# Patient Record
Sex: Male | Born: 1935 | Race: White | Hispanic: No | Marital: Married | Smoking: Former smoker
Health system: Southern US, Community
[De-identification: ages and names within clinical notes are randomized; demographics above are authoritative.]

## PROBLEM LIST (undated history)

## (undated) DIAGNOSIS — I714 Abdominal aortic aneurysm, without rupture: Secondary | ICD-10-CM

## (undated) DIAGNOSIS — J9383 Other pneumothorax: Secondary | ICD-10-CM

## (undated) DIAGNOSIS — K219 Gastro-esophageal reflux disease without esophagitis: Secondary | ICD-10-CM

## (undated) DIAGNOSIS — J449 Chronic obstructive pulmonary disease, unspecified: Secondary | ICD-10-CM

## (undated) DIAGNOSIS — IMO0001 Reserved for inherently not codable concepts without codable children: Secondary | ICD-10-CM

## (undated) HISTORY — PX: HERNIA REPAIR: SHX51

## (undated) HISTORY — DX: Abdominal aortic aneurysm, without rupture: I71.4

## (undated) HISTORY — PX: CHOLECYSTECTOMY: SHX55

---

## 2008-12-30 ENCOUNTER — Ambulatory Visit: Payer: Self-pay | Admitting: Family Medicine

## 2008-12-30 DIAGNOSIS — E785 Hyperlipidemia, unspecified: Secondary | ICD-10-CM

## 2008-12-30 DIAGNOSIS — K219 Gastro-esophageal reflux disease without esophagitis: Secondary | ICD-10-CM

## 2008-12-30 DIAGNOSIS — J449 Chronic obstructive pulmonary disease, unspecified: Secondary | ICD-10-CM

## 2009-01-01 ENCOUNTER — Ambulatory Visit: Payer: Self-pay | Admitting: Family Medicine

## 2009-01-01 DIAGNOSIS — IMO0002 Reserved for concepts with insufficient information to code with codable children: Secondary | ICD-10-CM

## 2009-01-01 DIAGNOSIS — L02519 Cutaneous abscess of unspecified hand: Secondary | ICD-10-CM

## 2009-01-01 DIAGNOSIS — L03119 Cellulitis of unspecified part of limb: Secondary | ICD-10-CM

## 2009-01-02 ENCOUNTER — Ambulatory Visit: Payer: Self-pay | Admitting: Family Medicine

## 2012-05-29 DIAGNOSIS — H11002 Unspecified pterygium of left eye: Secondary | ICD-10-CM | POA: Insufficient documentation

## 2013-11-03 ENCOUNTER — Encounter: Payer: Self-pay | Admitting: Emergency Medicine

## 2013-11-03 ENCOUNTER — Emergency Department (INDEPENDENT_AMBULATORY_CARE_PROVIDER_SITE_OTHER)
Admission: EM | Admit: 2013-11-03 | Discharge: 2013-11-03 | Disposition: A | Discharge status: Home / Self Care | Payer: Medicare Other | Source: Home / Self Care | Attending: Emergency Medicine | Admitting: Emergency Medicine

## 2013-11-03 DIAGNOSIS — S51811A Laceration without foreign body of right forearm, initial encounter: Secondary | ICD-10-CM

## 2013-11-03 DIAGNOSIS — Z23 Encounter for immunization: Secondary | ICD-10-CM

## 2013-11-03 DIAGNOSIS — S51809A Unspecified open wound of unspecified forearm, initial encounter: Secondary | ICD-10-CM

## 2013-11-03 HISTORY — DX: Reserved for inherently not codable concepts without codable children: IMO0001

## 2013-11-03 HISTORY — DX: Chronic obstructive pulmonary disease, unspecified: J44.9

## 2013-11-03 HISTORY — DX: Other pneumothorax: J93.83

## 2013-11-03 HISTORY — DX: Gastro-esophageal reflux disease without esophagitis: K21.9

## 2013-11-03 MED ORDER — CEPHALEXIN 500 MG PO CAPS: 500.0000 mg | ORAL_CAPSULE | Freq: Three times a day (TID) | ORAL | Status: DC

## 2013-11-03 MED ORDER — TETANUS-DIPHTH-ACELL PERTUSSIS 5-2.5-18.5 LF-MCG/0.5 IM SUSP
0.5000 mL | Freq: Once | INTRAMUSCULAR | Status: AC
  Administered 2013-11-03: 0.5 mL via INTRAMUSCULAR

## 2013-11-03 NOTE — ED Provider Notes (Addendum)
CSN: [id number]     Arrival date & time 11/03/13  1255 History   First MD Initiated Contact with Patient 11/03/13 1304     Chief Complaint  Patient presents with  . Extremity Laceration   Patient is a 80 y.o. male presenting with skin laceration. The history is provided by the patient and the spouse.  Laceration Location:  Shoulder/arm Shoulder/arm laceration location:  R forearm Length (cm):  3 cm Depth:  Cutaneous Quality: avulsion and straight   Bleeding: controlled with pressure   Time since incident:  1 day Injury mechanism: His cat accidentally scratched him. Pain details:    Severity:  No pain Foreign body present:  No foreign bodies Tetanus status: He thinks about 9 years ago.  [place] reports his cat scratching him yesterday on his RFA. (reports cat is UTD on all vaccines, and cat is otherwise acting normally).  Site started bleeding again today after he washed it vigorously in the shower.  Denies use of any anticoagulants.   Past Medical History  Diagnosis Date  . Reflux   . COPD (chronic obstructive pulmonary disease)   . Spontaneous pneumothorax    Past Surgical History  Procedure Laterality Date  . Cholecystectomy    . Hernia repair     Family History  Problem Relation Age of Onset  . Heart attack Mother   . Heart attack Father    History  Substance Use Topics  . Smoking status: Former Research scientist (life sciences)  . Smokeless tobacco: Never Used  . Alcohol Use: No    Review of Systems  All other systems reviewed and are negative.    Allergies  Hydromorphone hcl  Home Medications   Current Outpatient Rx  Name  Route  Sig  Dispense  Refill  . citalopram (CELEXA) 40 MG tablet   Oral   Take 40 mg by mouth daily.         Marland Kitchen esomeprazole (NEXIUM) 40 MG capsule   Oral   Take 40 mg by mouth daily at 12 noon.         . rosuvastatin (CRESTOR) 5 MG tablet   Oral   Take 5 mg by mouth daily.         . theophylline (THEO-24) 300 MG 24 hr capsule   Oral   Take  300 mg by mouth daily.         . cephALEXin (KEFLEX) 500 MG capsule   Oral   Take 1 capsule (500 mg total) by mouth 3 (three) times daily. X 7 days (Antibiotic)   21 capsule   0    BP 140/80  Pulse 88  Temp(Src) 98.3 F (36.8 C) (Oral)  Resp 16  Ht 5\' 10"  (1.778 m)  Wt 150 lb (68.04 kg)  BMI 21.52 kg/m2  SpO2 95% Physical Exam  Nursing note and vitals reviewed. Constitutional: He is oriented to person, place, and time. He appears well-developed and well-nourished. No distress.  HENT:  Head: Normocephalic and atraumatic.  Eyes: Conjunctivae and EOM are normal. Pupils are equal, round, and reactive to light. No scleral icterus.  Neck: Normal range of motion.  Cardiovascular: Normal rate.   Pulmonary/Chest: Effort normal.  Abdominal: He exhibits no distension.  Musculoskeletal: Normal range of motion.       Right forearm: He exhibits laceration. He exhibits no tenderness, no bony tenderness, no swelling and no deformity.       Arms: Superficial lacerations with slightly denuded/epidermal avulsions of right forearm. The wound oozing red blood,  controlled with our direct pressure. No pus or exudate or sign of infection.  There is underlying ecchymosis but no bony tenderness   Function of right elbow, forearm, wrist and hand normal. Neurovascular distally intact.  Neurological: He is alert and oriented to person, place, and time.  Skin: Skin is warm.  Psychiatric: He has a normal mood and affect.    ED Course  Procedures (including critical care time) Labs Review Labs Reviewed - No data to display Imaging Review No results found.  EKG Interpretation    Date/Time:    Ventricular Rate:    PR Interval:    QRS Duration:   QT Interval:    QTC Calculation:   R Axis:     Text Interpretation:              MDM   1. Laceration of right forearm with complication    from cat scratch. (This is not a cat bite)  Wound cleansed. No foreign body seen  Bleeding  controlled with direct pressure. Updated tetanus shot today. Prescription for cephalexin 500 3 times a day x 7 days to help prevent infection Tegaderm dressing, then wrapped with Coban . Written instructions given. Advised to keep the Tegaderm dressing on, and return here or to urgent care in 2 days for wound recheck. Precautions discussed. Red flags discussed. Questions invited and answered. Patient and wife voiced understanding and agreement.  Addendum: Patient and wife returned about 20 minutes after discharge, saying that bleeding had recurred.  We removed the dressing and there was a large clot of fresh blood beneath the Tegaderm. Once the clot was removed and the wound gently cleaned again, there was good hemostasis with direct pressure.  I applied Celox granules to the open wound with excellent hemostasis. We then redressed with Mepilex dressing and Coban and he tolerated this well.  Instead of recheck in 2 days, I advised return tomorrow for wound recheck. Patient and wife voiced understanding and agreement.  Over 45 minutes spent, greater than 50% of the time spent for counseling and coordination of care.    Jacqulyn Cane, MD 11/03/13 [place], MD 11/03/13 [place], MD 11/03/13 [phone]

## 2013-11-03 NOTE — ED Notes (Signed)
Bill Hinton reports his cat scratching him yesterday on his RFA. (reports cat is UTD on all vaccines). Site started bleeding again today. Denies use of any anticoagulants.

## 2013-11-04 ENCOUNTER — Encounter: Payer: Self-pay | Admitting: Emergency Medicine

## 2013-11-04 ENCOUNTER — Emergency Department (INDEPENDENT_AMBULATORY_CARE_PROVIDER_SITE_OTHER)
Admission: EM | Admit: 2013-11-04 | Discharge: 2013-11-04 | Disposition: A | Discharge status: Home / Self Care | Payer: Medicare Other | Source: Home / Self Care | Attending: Family Medicine | Admitting: Family Medicine

## 2013-11-04 DIAGNOSIS — W5503XA Scratched by cat, initial encounter: Principal | ICD-10-CM

## 2013-11-04 DIAGNOSIS — S50811A Abrasion of right forearm, initial encounter: Secondary | ICD-10-CM

## 2013-11-04 DIAGNOSIS — IMO0002 Reserved for concepts with insufficient information to code with codable children: Secondary | ICD-10-CM

## 2013-11-04 NOTE — ED Notes (Signed)
The pt is here today for a recheck of his RT arm abrasion.

## 2013-11-04 NOTE — ED Provider Notes (Signed)
CSN: [id number]     Arrival date & time 11/04/13  1235 History   First MD Initiated Contact with Patient 11/04/13 1343     Chief Complaint  Patient presents with  . Abrasion     HPI Comments: Patient returns for a re-evaluation of a cat scratch on his right forearm.  He states that he has minimal discomfort, and there has been no further bleeding.  The history is provided by the patient.    Past Medical History  Diagnosis Date  . Reflux   . COPD (chronic obstructive pulmonary disease)   . Spontaneous pneumothorax    Past Surgical History  Procedure Laterality Date  . Cholecystectomy    . Hernia repair     Family History  Problem Relation Age of Onset  . Heart attack Mother   . Heart attack Father    History  Substance Use Topics  . Smoking status: Former Research scientist (life sciences)  . Smokeless tobacco: Never Used  . Alcohol Use: No    Review of Systems  Constitutional: Negative for fever, chills and fatigue.  Neurological: Negative for numbness.    Allergies  Hydromorphone hcl  Home Medications   Current Outpatient Rx  Name  Route  Sig  Dispense  Refill  . cephALEXin (KEFLEX) 500 MG capsule   Oral   Take 1 capsule (500 mg total) by mouth 3 (three) times daily. X 7 days (Antibiotic)   21 capsule   0   . citalopram (CELEXA) 40 MG tablet   Oral   Take 40 mg by mouth daily.         Marland Kitchen esomeprazole (NEXIUM) 40 MG capsule   Oral   Take 40 mg by mouth daily at 12 noon.         . rosuvastatin (CRESTOR) 5 MG tablet   Oral   Take 5 mg by mouth daily.         . theophylline (THEO-24) 300 MG 24 hr capsule   Oral   Take 300 mg by mouth daily.          BP 138/79  Pulse 71  Temp(Src) 98 F (36.7 C) (Oral)  Resp 18  Ht 5\' 10"  (1.778 m)  Wt 152 lb (68.947 kg)  BMI 21.81 kg/m2  SpO2 96% Physical Exam  Nursing note and vitals reviewed. Constitutional: He is oriented to person, place, and time. He appears well-developed and well-nourished. No distress.  Eyes:  Conjunctivae are normal. Pupils are equal, round, and reactive to light.  Musculoskeletal: Normal range of motion.       Right shoulder: He exhibits no tenderness and no swelling.       Arms: After removing bandage right forearm, there is no evidence recent bleeding.  Cat scratch is superficial and about 2cm long with surrounding erythema.  No drainage from wound, and no swelling.  Neurological: He is alert and oriented to person, place, and time.  Skin: Skin is warm and dry.    ED Course  Procedures  none      MDM   1. Cat scratch of right forearm    Applied Bacitracin and 7.5cm Mepelex Border dressing. Leave bandage on for 3 days.  Keep bandage clean and dry.  After 3 days, apply a large bandaid daily with Bacitracin ointment.  Return for any signs of infection.    Kandra Nicolas, MD 11/04/13 1540

## 2013-11-04 NOTE — Discharge Instructions (Signed)
Leave bandage on for 3 days.  Keep bandage clean and dry.  After 3 days, apply a large bandaid daily with Bacitracin ointment.  Return for any signs of infection.   Abrasions An abrasion is a cut or scrape of the skin. Abrasions do not go through all layers of the skin. HOME CARE  If a bandage (dressing) was put on your wound, change it as told by your doctor. If the bandage sticks, soak it off with warm.  Wash the area with water and soap 2 times a day. Rinse off the soap. Pat the area dry with a clean towel.  Put on medicated cream (ointment) as told by your doctor.  Change your bandage right away if it gets wet or dirty.  Only take medicine as told by your doctor.  See your doctor within 24 48 hours to get your wound checked.  Check your wound for redness, puffiness (swelling), or yellowish-white fluid (pus). GET HELP RIGHT AWAY IF:   You have more pain in the wound.  You have redness, swelling, or tenderness around the wound.  You have pus coming from the wound.  You have a fever or lasting symptoms for more than 2 3 days.  You have a fever and your symptoms suddenly get worse.  You have a bad smell coming from the wound or bandage. MAKE SURE YOU:   Understand these instructions.  Will watch your condition.  Will get help right away if you are not doing well or get worse. Document Released: 03/19/2008 Document Revised: 06/25/2012 Document Reviewed: 09/04/2011 [facility] Patient Information 2014 [place], [place].

## 2013-11-19 DIAGNOSIS — H18459 Nodular corneal degeneration, unspecified eye: Secondary | ICD-10-CM | POA: Insufficient documentation

## 2014-01-19 DIAGNOSIS — H2511 Age-related nuclear cataract, right eye: Secondary | ICD-10-CM | POA: Insufficient documentation

## 2014-01-21 DIAGNOSIS — Z9849 Cataract extraction status, unspecified eye: Secondary | ICD-10-CM | POA: Insufficient documentation

## 2014-01-21 DIAGNOSIS — Z961 Presence of intraocular lens: Secondary | ICD-10-CM | POA: Insufficient documentation

## 2015-02-07 ENCOUNTER — Emergency Department (INDEPENDENT_AMBULATORY_CARE_PROVIDER_SITE_OTHER)
Admission: EM | Admit: 2015-02-07 | Discharge: 2015-02-07 | Disposition: A | Discharge status: Home / Self Care | Payer: Medicare Other | Source: Home / Self Care | Attending: Family Medicine | Admitting: Family Medicine

## 2015-02-07 DIAGNOSIS — R197 Diarrhea, unspecified: Secondary | ICD-10-CM

## 2015-02-07 DIAGNOSIS — R112 Nausea with vomiting, unspecified: Secondary | ICD-10-CM

## 2015-02-07 LAB — POCT CBC W AUTO DIFF (K'VILLE URGENT CARE)

## 2015-02-07 MED ORDER — ONDANSETRON 4 MG PO TBDP: 4.0000 mg | ORAL_TABLET | Freq: Three times a day (TID) | ORAL | Status: DC | PRN

## 2015-02-07 NOTE — Discharge Instructions (Signed)
Begin clear liquids (Pedialyte while having diarrhea) until improved, then advance to a [employer] (Bananas, Rice, Applesauce, Toast).  Then gradually resume a regular diet when tolerated.  Avoid milk products until well.  To decrease loose stools after diarrhea resolves, mix one teaspoon Citrucel (methylcellulose) in 2 oz water and drink one to three times daily.  Do not drink extra fluids with this dose and do not drink fluids for one hour afterwards.  When stools become more formed, may take Imodium (loperamide) once or twice daily to decrease stool frequency.  If symptoms become significantly worse during the night or over the weekend, proceed to the local emergency room.

## 2015-02-07 NOTE — ED Notes (Signed)
Patient complains of belching, vomiting, diarrhea x 4 days

## 2015-02-07 NOTE — ED Provider Notes (Signed)
CSN: [id number]     Arrival date & time 02/07/15  1004 History   First MD Initiated Contact with Patient 02/07/15 1216     Chief Complaint  Patient presents with  . Emesis      HPI Comments: Patient awoke 3 days ago with nausea/vomiting, diarrhea, sweats, and fatigue.  He has had crampy lower abdominal discomfort and increased flatus.  Denies recent foreign travel, or drinking untreated water in a wilderness environment.  He denies recent antibiotic use.   Patient is a 81 y.o. male presenting with vomiting. The history is provided by the patient.  Emesis Severity:  Mild Duration:  3 days Timing:  Intermittent Quality:  Stomach contents Able to tolerate:  Liquids Progression:  Improving Chronicity:  New Recent urination:  Decreased Relieved by:  None tried Worsened by:  Food smell Ineffective treatments:  None tried Associated symptoms: abdominal pain, chills and diarrhea   Associated symptoms: no arthralgias, no cough, no fever, no headaches, no myalgias, no sore throat and no URI   Risk factors: no sick contacts, no suspect food intake and no travel to endemic areas     Past Medical History  Diagnosis Date  . Reflux   . COPD (chronic obstructive pulmonary disease)   . Spontaneous pneumothorax    Past Surgical History  Procedure Laterality Date  . Cholecystectomy    . Hernia repair     Family History  Problem Relation Age of Onset  . Heart attack Mother   . Heart attack Father    History  Substance Use Topics  . Smoking status: Former Research scientist (life sciences)  . Smokeless tobacco: Never Used  . Alcohol Use: No    Review of Systems  Constitutional: Positive for chills.  HENT: Negative for sore throat.   Gastrointestinal: Positive for vomiting, abdominal pain and diarrhea.  Musculoskeletal: Negative for myalgias and arthralgias.  Neurological: Negative for headaches.  All other systems reviewed and are negative.   Allergies  Hydromorphone hcl  Home Medications   Prior to  Admission medications   Medication Sig Start Date End Date Taking? Authorizing Provider  Propylene Glycol 0.6 % SOLN Apply to eye.   Yes Historical Provider, MD  Tobramycin-Dexamethasone 0.3-0.05 % SUSP Apply to eye.   Yes Historical Provider, MD  cephALEXin (KEFLEX) 500 MG capsule Take 1 capsule (500 mg total) by mouth 3 (three) times daily. X 7 days (Antibiotic) 11/03/13   Jacqulyn Cane, MD  citalopram (CELEXA) 40 MG tablet Take 40 mg by mouth daily.    Historical Provider, MD  esomeprazole (NEXIUM) 40 MG capsule Take 40 mg by mouth daily at 12 noon.    Historical Provider, MD  ondansetron (ZOFRAN ODT) 4 MG disintegrating tablet Take 1 tablet (4 mg total) by mouth every 8 (eight) hours as needed for nausea or vomiting. Dissolve under tongue 02/07/15   Kandra Nicolas, MD  rosuvastatin (CRESTOR) 5 MG tablet Take 5 mg by mouth daily.    Historical Provider, MD  theophylline (THEO-24) 300 MG 24 hr capsule Take 300 mg by mouth daily.    Historical Provider, MD   BP 144/76 mmHg  Pulse 62  Temp(Src) 97.7 F (36.5 C) (Oral)  Ht 5\' 11"  (1.803 m)  Wt 150 lb (68.04 kg)  BMI 20.93 kg/m2  SpO2 95% Physical Exam Nursing notes and Vital Signs reviewed. Appearance:  Patient appears stated age, and in no acute distress Eyes:  Pupils are equal, round, and reactive to light and accomodation.  Extraocular movement is intact.  Conjunctivae are not inflamed  Ears:  Canals normal.  Tympanic membranes normal.  Nose:   Normal turbinates.  No sinus tenderness.  Pharynx:  Normal; moist mucous membranes  Neck:  Supple.  No adenopathy Lungs:  Clear to auscultation.  Breath sounds are equal.  Heart:  Regular rate and rhythm without murmurs, rubs, or gallops.  Abdomen:  Nontender without masses or hepatosplenomegaly.  Bowel sounds are present.  No CVA or flank tenderness.  Extremities:  No edema.  No calf tenderness Skin:  No rash present.   ED Course  Procedures  None   Labs Reviewed  POCT CBC W AUTO DIFF  ([facility]):  WBC 8.3; LY 19.8; MO 4.8; GR 75.4; Hgb 15.4; Platelets 245       MDM   1. Non-intractable vomiting with nausea, vomiting of unspecified type; suspect viral gastroenteritis   2. Diarrhea    Given Rx for ZofranODT 4mg  Begin clear liquids (Pedialyte while having diarrhea) until improved, then advance to a [employer] (Bananas, Rice, Applesauce, Toast).  Then gradually resume a regular diet when tolerated.  Avoid milk products until well.  To decrease loose stools after diarrhea resolves, mix one teaspoon Citrucel (methylcellulose) in 2 oz water and drink one to three times daily.  Do not drink extra fluids with this dose and do not drink fluids for one hour afterwards.  When stools become more formed, may take Imodium (loperamide) once or twice daily to decrease stool frequency.  If symptoms become significantly worse during the night or over the weekend, proceed to the local emergency room.  Followup with Family Doctor if not improved in 4 days.    Kandra Nicolas, MD 02/11/15 1106

## 2015-04-11 DIAGNOSIS — R351 Nocturia: Secondary | ICD-10-CM | POA: Insufficient documentation

## 2015-04-11 DIAGNOSIS — F410 Panic disorder [episodic paroxysmal anxiety] without agoraphobia: Secondary | ICD-10-CM | POA: Insufficient documentation

## 2015-04-11 DIAGNOSIS — N400 Enlarged prostate without lower urinary tract symptoms: Secondary | ICD-10-CM | POA: Insufficient documentation

## 2015-04-11 DIAGNOSIS — I10 Essential (primary) hypertension: Secondary | ICD-10-CM | POA: Insufficient documentation

## 2015-04-11 DIAGNOSIS — K449 Diaphragmatic hernia without obstruction or gangrene: Secondary | ICD-10-CM | POA: Insufficient documentation

## 2015-04-11 DIAGNOSIS — J302 Other seasonal allergic rhinitis: Secondary | ICD-10-CM | POA: Insufficient documentation

## 2015-04-11 DIAGNOSIS — I2510 Atherosclerotic heart disease of native coronary artery without angina pectoris: Secondary | ICD-10-CM | POA: Insufficient documentation

## 2015-04-11 DIAGNOSIS — N189 Chronic kidney disease, unspecified: Secondary | ICD-10-CM | POA: Insufficient documentation

## 2015-04-13 DIAGNOSIS — N201 Calculus of ureter: Secondary | ICD-10-CM | POA: Insufficient documentation

## 2015-11-29 DIAGNOSIS — H18451 Nodular corneal degeneration, right eye: Secondary | ICD-10-CM | POA: Diagnosis not present

## 2015-11-29 DIAGNOSIS — H3589 Other specified retinal disorders: Secondary | ICD-10-CM | POA: Diagnosis not present

## 2015-12-04 DIAGNOSIS — Z87892 Personal history of anaphylaxis: Secondary | ICD-10-CM | POA: Diagnosis not present

## 2015-12-04 DIAGNOSIS — Z888 Allergy status to other drugs, medicaments and biological substances status: Secondary | ICD-10-CM | POA: Diagnosis not present

## 2015-12-04 DIAGNOSIS — K219 Gastro-esophageal reflux disease without esophagitis: Secondary | ICD-10-CM | POA: Diagnosis not present

## 2015-12-04 DIAGNOSIS — R509 Fever, unspecified: Secondary | ICD-10-CM | POA: Diagnosis not present

## 2015-12-04 DIAGNOSIS — Z79899 Other long term (current) drug therapy: Secondary | ICD-10-CM | POA: Diagnosis not present

## 2015-12-04 DIAGNOSIS — J449 Chronic obstructive pulmonary disease, unspecified: Secondary | ICD-10-CM | POA: Diagnosis not present

## 2015-12-04 DIAGNOSIS — J111 Influenza due to unidentified influenza virus with other respiratory manifestations: Secondary | ICD-10-CM | POA: Diagnosis not present

## 2015-12-04 DIAGNOSIS — E785 Hyperlipidemia, unspecified: Secondary | ICD-10-CM | POA: Diagnosis not present

## 2015-12-04 DIAGNOSIS — R05 Cough: Secondary | ICD-10-CM | POA: Diagnosis not present

## 2015-12-04 DIAGNOSIS — R1110 Vomiting, unspecified: Secondary | ICD-10-CM | POA: Diagnosis not present

## 2015-12-06 DIAGNOSIS — F418 Other specified anxiety disorders: Secondary | ICD-10-CM | POA: Diagnosis not present

## 2015-12-06 DIAGNOSIS — E559 Vitamin D deficiency, unspecified: Secondary | ICD-10-CM | POA: Diagnosis not present

## 2015-12-06 DIAGNOSIS — I2510 Atherosclerotic heart disease of native coronary artery without angina pectoris: Secondary | ICD-10-CM | POA: Diagnosis not present

## 2015-12-06 DIAGNOSIS — R634 Abnormal weight loss: Secondary | ICD-10-CM | POA: Diagnosis not present

## 2015-12-06 DIAGNOSIS — E782 Mixed hyperlipidemia: Secondary | ICD-10-CM | POA: Diagnosis not present

## 2015-12-08 DIAGNOSIS — E781 Pure hyperglyceridemia: Secondary | ICD-10-CM | POA: Diagnosis not present

## 2015-12-08 DIAGNOSIS — K219 Gastro-esophageal reflux disease without esophagitis: Secondary | ICD-10-CM | POA: Diagnosis not present

## 2015-12-08 DIAGNOSIS — J449 Chronic obstructive pulmonary disease, unspecified: Secondary | ICD-10-CM | POA: Diagnosis not present

## 2015-12-08 DIAGNOSIS — Z0000 Encounter for general adult medical examination without abnormal findings: Secondary | ICD-10-CM | POA: Diagnosis not present

## 2015-12-08 DIAGNOSIS — G3184 Mild cognitive impairment, so stated: Secondary | ICD-10-CM | POA: Diagnosis not present

## 2015-12-08 DIAGNOSIS — F418 Other specified anxiety disorders: Secondary | ICD-10-CM | POA: Diagnosis not present

## 2015-12-08 DIAGNOSIS — J111 Influenza due to unidentified influenza virus with other respiratory manifestations: Secondary | ICD-10-CM | POA: Diagnosis not present

## 2015-12-08 DIAGNOSIS — N200 Calculus of kidney: Secondary | ICD-10-CM | POA: Diagnosis not present

## 2015-12-23 DIAGNOSIS — H353122 Nonexudative age-related macular degeneration, left eye, intermediate dry stage: Secondary | ICD-10-CM | POA: Diagnosis not present

## 2015-12-23 DIAGNOSIS — H1840 Unspecified corneal degeneration: Secondary | ICD-10-CM | POA: Diagnosis not present

## 2015-12-23 DIAGNOSIS — Z961 Presence of intraocular lens: Secondary | ICD-10-CM | POA: Diagnosis not present

## 2015-12-23 DIAGNOSIS — H353114 Nonexudative age-related macular degeneration, right eye, advanced atrophic with subfoveal involvement: Secondary | ICD-10-CM | POA: Diagnosis not present

## 2015-12-27 DIAGNOSIS — K5900 Constipation, unspecified: Secondary | ICD-10-CM | POA: Diagnosis not present

## 2015-12-27 DIAGNOSIS — N200 Calculus of kidney: Secondary | ICD-10-CM | POA: Diagnosis not present

## 2015-12-27 DIAGNOSIS — I878 Other specified disorders of veins: Secondary | ICD-10-CM | POA: Diagnosis not present

## 2016-01-12 DIAGNOSIS — G3184 Mild cognitive impairment, so stated: Secondary | ICD-10-CM | POA: Diagnosis not present

## 2016-01-12 DIAGNOSIS — R5381 Other malaise: Secondary | ICD-10-CM | POA: Diagnosis not present

## 2016-01-16 DIAGNOSIS — R413 Other amnesia: Secondary | ICD-10-CM | POA: Diagnosis not present

## 2016-01-16 DIAGNOSIS — G3184 Mild cognitive impairment, so stated: Secondary | ICD-10-CM | POA: Diagnosis not present

## 2016-01-16 DIAGNOSIS — I6782 Cerebral ischemia: Secondary | ICD-10-CM | POA: Diagnosis not present

## 2016-02-22 DIAGNOSIS — R413 Other amnesia: Secondary | ICD-10-CM | POA: Diagnosis not present

## 2016-02-22 DIAGNOSIS — E531 Pyridoxine deficiency: Secondary | ICD-10-CM | POA: Diagnosis not present

## 2016-02-22 DIAGNOSIS — G301 Alzheimer's disease with late onset: Secondary | ICD-10-CM | POA: Diagnosis not present

## 2016-02-22 DIAGNOSIS — R270 Ataxia, unspecified: Secondary | ICD-10-CM | POA: Diagnosis not present

## 2016-02-22 DIAGNOSIS — G3184 Mild cognitive impairment, so stated: Secondary | ICD-10-CM | POA: Diagnosis not present

## 2016-02-22 DIAGNOSIS — R202 Paresthesia of skin: Secondary | ICD-10-CM | POA: Diagnosis not present

## 2016-02-23 ENCOUNTER — Emergency Department (INDEPENDENT_AMBULATORY_CARE_PROVIDER_SITE_OTHER)
Admission: EM | Admit: 2016-02-23 | Discharge: 2016-02-23 | Disposition: A | Discharge status: Home / Self Care | Payer: Medicare Other | Source: Home / Self Care | Attending: Family Medicine | Admitting: Family Medicine

## 2016-02-23 ENCOUNTER — Emergency Department (INDEPENDENT_AMBULATORY_CARE_PROVIDER_SITE_OTHER): Payer: Medicare Other

## 2016-02-23 ENCOUNTER — Encounter: Payer: Self-pay | Admitting: *Deleted

## 2016-02-23 DIAGNOSIS — R0602 Shortness of breath: Secondary | ICD-10-CM | POA: Diagnosis not present

## 2016-02-23 DIAGNOSIS — J441 Chronic obstructive pulmonary disease with (acute) exacerbation: Secondary | ICD-10-CM

## 2016-02-23 DIAGNOSIS — R918 Other nonspecific abnormal finding of lung field: Secondary | ICD-10-CM | POA: Diagnosis not present

## 2016-02-23 DIAGNOSIS — R05 Cough: Secondary | ICD-10-CM | POA: Diagnosis not present

## 2016-02-23 MED ORDER — IPRATROPIUM-ALBUTEROL 0.5-2.5 (3) MG/3ML IN SOLN
3.0000 mL | Freq: Once | RESPIRATORY_TRACT | Status: AC
  Administered 2016-02-23: 3 mL via RESPIRATORY_TRACT

## 2016-02-23 MED ORDER — ACETAMINOPHEN 325 MG PO TABS
325.0000 mg | ORAL_TABLET | Freq: Once | ORAL | Status: AC
  Administered 2016-02-23: 325 mg via ORAL

## 2016-02-23 MED ORDER — PREDNISONE 20 MG PO TABS: ORAL_TABLET | ORAL | Status: DC

## 2016-02-23 MED ORDER — METHYLPREDNISOLONE SODIUM SUCC 40 MG IJ SOLR
80.0000 mg | Freq: Once | INTRAMUSCULAR | Status: AC
  Administered 2016-02-23: 80 mg via INTRAMUSCULAR

## 2016-02-23 MED ORDER — DOXYCYCLINE HYCLATE 100 MG PO CAPS: 100.0000 mg | ORAL_CAPSULE | Freq: Two times a day (BID) | ORAL | Status: DC

## 2016-02-23 NOTE — Discharge Instructions (Signed)
Please call your Pulmonologist to discuss today's Chest X-ray as he may want to order a follow up CT scan of your chest.  Please keep a copy for the interpretation of your chest x-ray that was provided to you today for records when you call your pulmonologist.  Please try to call them today or tomorrow morning.  You were given a shot of solumedrol (a steroid) today to help with itching and swelling from a likely allergic reaction.  You have been prescribed 5 days of prednisone, an oral steroid.  You may start this medication tomorrow with breakfast.

## 2016-02-23 NOTE — ED Provider Notes (Signed)
CSN: [id number]     Arrival date & time 02/23/16  1353 History   First MD Initiated Contact with Patient 02/23/16 1353     Chief Complaint  Patient presents with  . Shortness of Breath  . Generalized Body Aches   (Consider location/radiation/quality/duration/timing/severity/associated sxs/prior Treatment) HPI The pt is an 82yo male presenting to [facility] with c/o sudden onset body aches, chills, shortness of breath, cough, congestion, and temperature of 99.5*F this morning. He took 2 tabs of Tylenol this morning at 7AM and 1 tab of Tylenol at 12PM for body aches and his fever. Minimal relief.  Pt has hx of COPD.  He notes he was seen at [facility] on 02/07/16 for n/v/d but those symptoms resolved. Denies hx of CAD or CHF.  Pt does have a pulmonologist for his COPD and has had steroids in the past when he gets sick.  He has been hospitalized for pneumonia in the past but that was over 10 years ago.  Denies sick contacts or recent travel.    Past Medical History  Diagnosis Date  . Reflux   . COPD (chronic obstructive pulmonary disease) ([place])   . Spontaneous pneumothorax    Past Surgical History  Procedure Laterality Date  . Cholecystectomy    . Hernia repair     Family History  Problem Relation Age of Onset  . Heart attack Mother   . Heart attack Father    Social History  Substance Use Topics  . Smoking status: Former Research scientist (life sciences)  . Smokeless tobacco: Never Used  . Alcohol Use: No    Review of Systems  Constitutional: Positive for fever, chills and fatigue.  HENT: Positive for congestion and rhinorrhea. Negative for ear pain, sore throat, trouble swallowing and voice change.   Respiratory: Positive for cough and shortness of breath.   Cardiovascular: Negative for chest pain and palpitations.  Gastrointestinal: Negative for nausea, vomiting, abdominal pain and diarrhea.  Musculoskeletal: Positive for myalgias and arthralgias. Negative for back pain.  Skin: Negative for rash.  Neurological:  Positive for headaches. Negative for dizziness and light-headedness.    Allergies  Hydromorphone hcl  Home Medications   Prior to Admission medications   Medication Sig Start Date End Date Taking? Authorizing Provider  citalopram (CELEXA) 40 MG tablet Take 40 mg by mouth daily.    Historical Provider, MD  doxycycline (VIBRAMYCIN) 100 MG capsule Take 1 capsule (100 mg total) by mouth 2 (two) times daily. One po bid x 7 days 02/23/16   Noland Fordyce, PA-C  esomeprazole (NEXIUM) 40 MG capsule Take 40 mg by mouth daily at 12 noon.    Historical Provider, MD  predniSONE (DELTASONE) 20 MG tablet 3 tabs po day one, then 2 po daily x 4 days 02/23/16   Noland Fordyce, PA-C  Propylene Glycol 0.6 % SOLN Apply to eye.    Historical Provider, MD  rosuvastatin (CRESTOR) 5 MG tablet Take 5 mg by mouth daily.    Historical Provider, MD  theophylline (THEO-24) 300 MG 24 hr capsule Take 300 mg by mouth daily.    Historical Provider, MD  Tobramycin-Dexamethasone 0.3-0.05 % SUSP Apply to eye.    Historical Provider, MD   Meds Ordered and Administered this Visit   Medications  ipratropium-albuterol (DUONEB) 0.5-2.5 (3) MG/3ML nebulizer solution 3 mL (3 mLs Nebulization Given 02/23/16 1403)  methylPREDNISolone sodium succinate (SOLU-MEDROL) 40 mg/mL injection 80 mg (80 mg Intramuscular Given 02/23/16 1459)  acetaminophen (TYLENOL) tablet 325 mg (325 mg Oral Given 02/23/16 1459)  BP 157/73 mmHg  Pulse 101  Temp(Src) 98.6 F (37 C) (Oral)  Resp 26  SpO2 94% No data found.   Physical Exam  Constitutional: He appears well-developed and well-nourished.  Pt sitting in exam chair holding his head, leaning onto the desk. Appears uncomfortable.   HENT:  Head: Normocephalic and atraumatic.  Right Ear: Tympanic membrane normal.  Left Ear: Tympanic membrane normal.  Nose: Nose normal.  Mouth/Throat: Uvula is midline, oropharynx is clear and moist and mucous membranes are normal.  Eyes: Conjunctivae are  normal. No scleral icterus.  Neck: Normal range of motion. Neck supple.  Cardiovascular: Normal rate, regular rhythm and normal heart sounds.   Pulmonary/Chest: Effort normal. No respiratory distress. He has wheezes. He has rhonchi. He has no rales.  Mildly short of breath between sentences. No accessory muscle use.  Lungs: faint diffuse expiratory wheeze. Diffuse rhonchi.   Abdominal: Soft. He exhibits no distension. There is no tenderness.  Musculoskeletal: Normal range of motion.  Neurological: He is alert.  Skin: Skin is warm and dry.  Nursing note and vitals reviewed.   ED Course  Procedures (including critical care time)  Labs Review Labs Reviewed - No data to display  Imaging Review Dg Chest 2 View  02/23/2016  CLINICAL DATA:  Patient with history of body aches and fever. EXAM: CHEST  2 VIEW COMPARISON:  None. FINDINGS: Normal cardiac and mediastinal contours. Pulmonary hyperinflation. No large area of pulmonary consolidation. No pleural effusion or pneumothorax. Biapical pleural parenchymal thickening and scarring. Mid thoracic spine degenerative changes. Additionally there is a more focal masslike opacity within the right upper hemi thorax. IMPRESSION: More focal masslike opacity within the right upper hemi thorax is nonspecific may be related to prior scarring. Right apical pulmonary mass is not excluded. This needs evaluation with chest CT in the non acute setting. Pulmonary hyperinflation.  No acute cardiopulmonary process. These results will be called to the ordering clinician or representative by the Radiologist Assistant, and communication documented in the PACS or zVision Dashboard. Electronically Signed   By: Lovey Newcomer M.D.   On: 02/23/2016 14:38     MDM   1. COPD exacerbation ([place])    Pt presenting to [facility] with COPD exacerbation.  Pt is afebrile, O2 Sat 95% on RA.  Coarse breath sounds throughout.  Pt given Solumedrol 80mg  IM and Duoneb. Pt appears improved after  treatment and states he feels a little better.   CXR: mass-like opacity within Right upper hemi thorax, possibly due to scarring.  Recommend further evaluation via non-urgent CT.  Discussed imaging with pt and provided copy of interpretation.  Pt does have a Pulmonologist. Encouraged to call them today or tomorrow to go over today's CXR as he may need f/u CT scan. Rx: Azithromycin and prednisone Discussed symptoms that warrant emergent care in the ED. Patient and wife verbalized understanding and agreement with treatment plan.     Noland Fordyce, PA-C 02/23/16 1655

## 2016-02-23 NOTE — ED Notes (Signed)
Pt and wife report runny nose x 1 month. Awoke this AM with body aches and HA and SOB. H/o COPD, no 02 use @ home. Taken 2 tylenol @ 7am, 1 @ 12pm.

## 2016-02-24 ENCOUNTER — Telehealth: Payer: Self-pay | Admitting: *Deleted

## 2016-03-12 ENCOUNTER — Encounter: Payer: Self-pay | Admitting: Emergency Medicine

## 2016-03-12 ENCOUNTER — Emergency Department (INDEPENDENT_AMBULATORY_CARE_PROVIDER_SITE_OTHER): Payer: Medicare Other

## 2016-03-12 ENCOUNTER — Emergency Department (INDEPENDENT_AMBULATORY_CARE_PROVIDER_SITE_OTHER)
Admission: EM | Admit: 2016-03-12 | Discharge: 2016-03-12 | Disposition: A | Discharge status: Home / Self Care | Payer: Medicare Other | Source: Home / Self Care | Attending: Family Medicine | Admitting: Family Medicine

## 2016-03-12 ENCOUNTER — Telehealth: Payer: Self-pay | Admitting: Emergency Medicine

## 2016-03-12 DIAGNOSIS — R05 Cough: Secondary | ICD-10-CM | POA: Diagnosis not present

## 2016-03-12 DIAGNOSIS — R918 Other nonspecific abnormal finding of lung field: Secondary | ICD-10-CM | POA: Diagnosis not present

## 2016-03-12 DIAGNOSIS — R0602 Shortness of breath: Secondary | ICD-10-CM | POA: Diagnosis not present

## 2016-03-12 DIAGNOSIS — J439 Emphysema, unspecified: Secondary | ICD-10-CM

## 2016-03-12 DIAGNOSIS — J441 Chronic obstructive pulmonary disease with (acute) exacerbation: Secondary | ICD-10-CM

## 2016-03-12 DIAGNOSIS — J189 Pneumonia, unspecified organism: Secondary | ICD-10-CM

## 2016-03-12 MED ORDER — AZITHROMYCIN 250 MG PO TABS: 250.0000 mg | ORAL_TABLET | Freq: Every day | ORAL | Status: DC

## 2016-03-12 MED ORDER — DEXAMETHASONE SODIUM PHOSPHATE 10 MG/ML IJ SOLN
10.0000 mg | Freq: Once | INTRAMUSCULAR | Status: AC
  Administered 2016-03-12: 10 mg via INTRAMUSCULAR

## 2016-03-12 MED ORDER — AMOXICILLIN-POT CLAVULANATE 875-125 MG PO TABS: 1.0000 | ORAL_TABLET | Freq: Two times a day (BID) | ORAL | Status: DC

## 2016-03-12 MED ORDER — PREDNISONE 20 MG PO TABS: ORAL_TABLET | ORAL | Status: DC

## 2016-03-12 NOTE — ED Provider Notes (Signed)
CSN: [id number]     Arrival date & time 03/12/16  0908 History   First MD Initiated Contact with Patient 03/12/16 [phone]     Chief Complaint  Patient presents with  . URI   (Consider location/radiation/quality/duration/timing/severity/associated sxs/prior Treatment) HPI The pt is an 82yo male brought to [facility] by wife and daughter with c/o worsening rhinorrhea, watery eyes, decreased energy and shortness of breath, associated mild to moderately productive cough. Hx of COPD. He states he feels more rattles in his chest this morning after doing a albuterol nebulizer treatment PTA. Pt was treated 3 weeks ago at [facility] for COPD exacerbation with doxycycline (not azithromycin) and prednisone, he was feeling better but states sinus congestion is worse.  He has not f/u with pulmonology as recommended after his last visit to [facility]. Denies fever, chills, n/v/d.   Past Medical History  Diagnosis Date  . Reflux   . COPD (chronic obstructive pulmonary disease) ([place])   . Spontaneous pneumothorax    Past Surgical History  Procedure Laterality Date  . Cholecystectomy    . Hernia repair     Family History  Problem Relation Age of Onset  . Heart attack Mother   . Heart attack Father    Social History  Substance Use Topics  . Smoking status: Former Research scientist (life sciences)  . Smokeless tobacco: Never Used  . Alcohol Use: No    Review of Systems  Constitutional: Positive for appetite change and fatigue. Negative for fever and chills.  HENT: Positive for congestion, postnasal drip, rhinorrhea, sinus pressure and sneezing. Negative for ear pain, sore throat, trouble swallowing and voice change.   Respiratory: Positive for cough and shortness of breath.   Cardiovascular: Negative for chest pain and palpitations.  Gastrointestinal: Negative for nausea, vomiting, abdominal pain and diarrhea.  Musculoskeletal: Negative for myalgias, back pain and arthralgias.  Skin: Negative for rash.    Allergies  Hydromorphone hcl  Home  Medications   Prior to Admission medications   Medication Sig Start Date End Date Taking? Authorizing Provider  amoxicillin-clavulanate (AUGMENTIN) 875-125 MG tablet Take 1 tablet by mouth 2 (two) times daily. One po bid x 7 days 03/12/16   Noland Fordyce, PA-C  citalopram (CELEXA) 40 MG tablet Take 40 mg by mouth daily.    Historical Provider, MD  doxycycline (VIBRAMYCIN) 100 MG capsule Take 1 capsule (100 mg total) by mouth 2 (two) times daily. One po bid x 7 days 02/23/16   Noland Fordyce, PA-C  esomeprazole (NEXIUM) 40 MG capsule Take 40 mg by mouth daily at 12 noon.    Historical Provider, MD  predniSONE (DELTASONE) 20 MG tablet 3 tabs po day one, then 2 po daily x 4 days 02/23/16   Noland Fordyce, PA-C  predniSONE (DELTASONE) 20 MG tablet 2 tabs po daily x 3 days 03/12/16   Noland Fordyce, PA-C  Propylene Glycol 0.6 % SOLN Apply to eye.    Historical Provider, MD  rosuvastatin (CRESTOR) 5 MG tablet Take 5 mg by mouth daily.    Historical Provider, MD  theophylline (THEO-24) 300 MG 24 hr capsule Take 300 mg by mouth daily.    Historical Provider, MD  Tobramycin-Dexamethasone 0.3-0.05 % SUSP Apply to eye.    Historical Provider, MD   Meds Ordered and Administered this Visit   Medications  dexamethasone (DECADRON) injection 10 mg (10 mg Intramuscular Given 03/12/16 1055)    BP 149/79 mmHg  Pulse 67  Temp(Src) 98.1 F (36.7 C) (Oral)  Wt 141 lb (63.957 kg)  SpO2 94% No data found.   Physical Exam  Constitutional: He appears well-developed and well-nourished.  Elderly male sitting in exam chair, NAD. Appears well.   HENT:  Head: Normocephalic and atraumatic.  Eyes: Conjunctivae are normal. No scleral icterus.  Neck: Normal range of motion. Neck supple.  Cardiovascular: Normal rate, regular rhythm and normal heart sounds.   Pulmonary/Chest: Effort normal. No respiratory distress. He has wheezes. He has rhonchi in the right lower field and the left lower field. He exhibits no tenderness.   Abdominal: Soft. He exhibits no distension and no mass. There is no tenderness. There is no rebound and no guarding.  Musculoskeletal: Normal range of motion.  Neurological: He is alert.  Skin: Skin is warm and dry.  Nursing note and vitals reviewed.   ED Course  Procedures (including critical care time)  Labs Review Labs Reviewed - No data to display  Imaging Review Dg Chest 2 View  03/12/2016  CLINICAL DATA:  Persistent cough and shortness of breath. COPD. Decreased appetite. EXAM: CHEST  2 VIEW COMPARISON:  02/23/2016 chest radiograph. FINDINGS: Stable cardiomediastinal silhouette with normal heart size. No pneumothorax. No pleural effusion. Hyperinflated lungs. Emphysema. Irregular subpleural biapical lung opacities, slightly asymmetric to the right. No pulmonary edema. New small focal opacity in the mid to lower left lung. IMPRESSION: 1. New small focal opacity in the mid to lower left lung, suggesting developing pneumonia. 2. Stable irregular subpleural biapical lung opacities, slightly asymmetric to the right, favor chronic pleural-parenchymal scarring, however cannot exclude a right apical pulmonary nodule given the asymmetry. 3. A follow-up post treatment chest CT is recommended in 4-6 weeks to re-evaluate these findings. 4. Emphysema and hyperinflated lungs, consistent with the provided history of COPD. Electronically Signed   By: Ilona Sorrel M.D.   On: 03/12/2016 10:32     MDM   1. CAP (community acquired pneumonia)   2. COPD exacerbation (HCC)    Exam c/w URI, O2 Sat 94% on RA  CXR: evidence of developing pneumonia in Left lower lung.    Per medical records pt was originially going to be started on Azithromycin but was placed on Doxycycline instead as pt takes Celexa.  Will try Augmentin.   Rx: Augmentin  F/u with PCP and Pulmonology for recheck of symptoms and repeat imaging including CT chest. Patient and family verbalized understanding and agreement with treatment  plan.      Noland Fordyce, PA-C 03/12/16 1223

## 2016-03-12 NOTE — Discharge Instructions (Signed)

## 2016-03-12 NOTE — ED Notes (Signed)
Pt c/o runny nose, watery eyes, no energy, SOB, some productive cough

## 2016-03-15 NOTE — ED Notes (Signed)
Callback: No answer, LMOM f/u from visit, call back as needed.

## 2016-03-20 DIAGNOSIS — R918 Other nonspecific abnormal finding of lung field: Secondary | ICD-10-CM | POA: Diagnosis not present

## 2016-03-20 DIAGNOSIS — J479 Bronchiectasis, uncomplicated: Secondary | ICD-10-CM | POA: Diagnosis not present

## 2016-03-20 DIAGNOSIS — J449 Chronic obstructive pulmonary disease, unspecified: Secondary | ICD-10-CM | POA: Diagnosis not present

## 2016-03-20 DIAGNOSIS — I2510 Atherosclerotic heart disease of native coronary artery without angina pectoris: Secondary | ICD-10-CM | POA: Diagnosis not present

## 2016-04-25 ENCOUNTER — Emergency Department (INDEPENDENT_AMBULATORY_CARE_PROVIDER_SITE_OTHER)
Admission: EM | Admit: 2016-04-25 | Discharge: 2016-04-25 | Disposition: A | Discharge status: Home / Self Care | Payer: Medicare Other | Source: Home / Self Care | Attending: Family Medicine | Admitting: Family Medicine

## 2016-04-25 ENCOUNTER — Other Ambulatory Visit: Payer: Self-pay | Admitting: Family Medicine

## 2016-04-25 ENCOUNTER — Encounter: Payer: Self-pay | Admitting: *Deleted

## 2016-04-25 DIAGNOSIS — R519 Headache, unspecified: Secondary | ICD-10-CM

## 2016-04-25 DIAGNOSIS — J439 Emphysema, unspecified: Secondary | ICD-10-CM | POA: Diagnosis not present

## 2016-04-25 DIAGNOSIS — J984 Other disorders of lung: Secondary | ICD-10-CM | POA: Diagnosis not present

## 2016-04-25 DIAGNOSIS — J479 Bronchiectasis, uncomplicated: Secondary | ICD-10-CM | POA: Diagnosis not present

## 2016-04-25 DIAGNOSIS — R51 Headache: Secondary | ICD-10-CM

## 2016-04-25 DIAGNOSIS — Z79899 Other long term (current) drug therapy: Secondary | ICD-10-CM | POA: Diagnosis not present

## 2016-04-25 DIAGNOSIS — R590 Localized enlarged lymph nodes: Secondary | ICD-10-CM

## 2016-04-25 DIAGNOSIS — M26621 Arthralgia of right temporomandibular joint: Secondary | ICD-10-CM | POA: Diagnosis not present

## 2016-04-25 LAB — POCT CBC W AUTO DIFF (K'VILLE URGENT CARE)

## 2016-04-25 NOTE — Discharge Instructions (Signed)
Apply ice pack for 15 to 20 minutes, 3 to 4 times daily  Continue until pain decreases.

## 2016-04-25 NOTE — ED Notes (Signed)
Pt c/o RT ear pain x 4 days. Denies fever.

## 2016-04-25 NOTE — ED Provider Notes (Signed)
CSN: [id number]     Arrival date & time 04/25/16  1150 History   First MD Initiated Contact with Patient 04/25/16 1211     Chief Complaint  Patient presents with  . Otalgia      HPI Comments: Patient complains of four day history of pain in his right ear and neck (but no sore throat) and pain in his right jaw with chewing.  He also has a persistent right headache without neurologic symptoms.  No fevers, chills, and sweats.  He reports that he has scheduled a CT scan of his chest at 2pm today for follow-up of CA treated about six weeks ago, and a right apical pulmonary nodule.  He notes that his cough has resolved.  The history is provided by the patient and the spouse.    Past Medical History  Diagnosis Date  . Reflux   . COPD (chronic obstructive pulmonary disease) ([place])   . Spontaneous pneumothorax    Past Surgical History  Procedure Laterality Date  . Cholecystectomy    . Hernia repair     Family History  Problem Relation Age of Onset  . Heart attack Mother   . Heart attack Father    Social History  Substance Use Topics  . Smoking status: Former Research scientist (life sciences)  . Smokeless tobacco: Never Used  . Alcohol Use: No    Review of Systems  Constitutional: Positive for fatigue. Negative for fever, chills, diaphoresis, activity change, appetite change and unexpected weight change.  HENT: Positive for ear pain. Negative for congestion, ear discharge, facial swelling, mouth sores, nosebleeds, postnasal drip, rhinorrhea, sinus pressure, sneezing, sore throat, tinnitus, trouble swallowing and voice change.   Eyes: Negative.   Respiratory: Positive for shortness of breath and wheezing. Negative for cough and chest tightness.   Cardiovascular: Negative.   Gastrointestinal: Negative.   Genitourinary: Negative.   Musculoskeletal: Positive for neck pain. Negative for myalgias.  Skin: Negative.   Neurological: Positive for headaches. Negative for syncope, facial asymmetry, speech difficulty and  numbness.    Allergies  Hydromorphone hcl  Home Medications   Prior to Admission medications   Medication Sig Start Date End Date Taking? Authorizing Provider  citalopram (CELEXA) 40 MG tablet Take 40 mg by mouth daily.    Historical Provider, MD  esomeprazole (NEXIUM) 40 MG capsule Take 40 mg by mouth daily at 12 noon.    Historical Provider, MD  Propylene Glycol 0.6 % SOLN Apply to eye.    Historical Provider, MD  rosuvastatin (CRESTOR) 5 MG tablet Take 5 mg by mouth daily.    Historical Provider, MD  theophylline (THEO-24) 300 MG 24 hr capsule Take 300 mg by mouth daily.    Historical Provider, MD   Meds Ordered and Administered this Visit  Medications - No data to display  BP 135/69 mmHg  Pulse 67  Temp(Src) 98.7 F (37.1 C) (Oral)  Resp 18  Ht 5\' 11"  (1.803 m)  Wt 144 lb (65.318 kg)  BMI 20.09 kg/m2  SpO2 97% No data found.   Physical Exam  Constitutional: He is oriented to person, place, and time. He appears well-developed and well-nourished. No distress.  HENT:  Head: Normocephalic.    Right Ear: External ear normal.  Left Ear: External ear normal.  Nose: Nose normal.  Mouth/Throat: Oropharynx is clear and moist.  There is tenderness to palpation right posterior and anterior nodes as noted on diagram.  There is distinct tenderness to palpation over the right supraclavicular node.  There is  tenderness to palpation over the right temporal artery.  There is distinct tenderness to palpation over the right TMJ and an audible click with mandible movement.    Eyes: Conjunctivae and EOM are normal. Pupils are equal, round, and reactive to light. Right eye exhibits no discharge. Left eye exhibits no discharge.  Neck: Neck supple. No thyromegaly present.  Cardiovascular: Normal heart sounds.   Pulmonary/Chest: Effort normal. No respiratory distress. He exhibits no tenderness.  Breath sounds decrease posteriorly; scattered rhonchi.  Abdominal: There is no tenderness.    Musculoskeletal: He exhibits no edema or tenderness.  Lymphadenopathy:    He has cervical adenopathy.  Neurological: He is alert and oriented to person, place, and time.  Skin: Skin is warm and dry. No rash noted.  Nursing note and vitals reviewed.   ED Course  Procedures none    Labs Reviewed  SEDIMENTATION RATE  C-REACTIVE PROTEIN  COMPREHENSIVE METABOLIC PANEL  POCT CBC W AUTO DIFF ([facility]):  WBC 7.1; LY 21.1; MO 4.9; GR 74.0; Hgb 14.7; Platelets 211    Tympanometry:  Right ear tympanogram normal; Left ear tympanogram wide.     MDM   1. Right temporal headache; concern for possible temporal arteritis   2. LAD (lymphadenopathy) of right cervical region (and right supraclavicular node)  3. Arthralgia of right temporomandibular joint    Concern at present is CT scan chest (scheduled at 2 pm today at [facility] facility) for follow-up of pulmonary nodule right apical region, and CAP  Apply ice pack for 15 to 20 minutes, 3 to 4 times daily to right TMJ.  Continue until pain decreases.  Recommend follow-up ENT for evaluation of right TMJ pain. Sed rate and C reactive protein pending (consider neurology referral if elevated)    Kandra Nicolas, MD 04/26/16 1324

## 2016-04-26 ENCOUNTER — Encounter: Payer: Self-pay | Admitting: Emergency Medicine

## 2016-04-26 ENCOUNTER — Emergency Department (INDEPENDENT_AMBULATORY_CARE_PROVIDER_SITE_OTHER)
Admission: EM | Admit: 2016-04-26 | Discharge: 2016-04-26 | Disposition: A | Discharge status: Home / Self Care | Payer: Medicare Other | Source: Home / Self Care | Attending: Family Medicine | Admitting: Family Medicine

## 2016-04-26 DIAGNOSIS — J441 Chronic obstructive pulmonary disease with (acute) exacerbation: Secondary | ICD-10-CM | POA: Diagnosis not present

## 2016-04-26 DIAGNOSIS — R590 Localized enlarged lymph nodes: Secondary | ICD-10-CM

## 2016-04-26 LAB — COMPREHENSIVE METABOLIC PANEL
ALBUMIN: 3.7 g/dL (ref 3.6–5.1)
ALK PHOS: 145 U/L — AB (ref 40–115)
ALT: 10 U/L (ref 9–46)
AST: 15 U/L (ref 10–35)
BILIRUBIN TOTAL: 1.1 mg/dL (ref 0.2–1.2)
BUN: 12 mg/dL (ref 7–25)
CO2: 28 mmol/L (ref 20–31)
CREATININE: 1.08 mg/dL (ref 0.70–1.11)
Calcium: 9.2 mg/dL (ref 8.6–10.3)
Chloride: 103 mmol/L (ref 98–110)
Glucose, Bld: 72 mg/dL (ref 65–99)
Potassium: 4.6 mmol/L (ref 3.5–5.3)
SODIUM: 143 mmol/L (ref 135–146)
TOTAL PROTEIN: 6.1 g/dL (ref 6.1–8.1)

## 2016-04-26 LAB — C-REACTIVE PROTEIN: CRP: <0.5 mg/dL (ref <0.60)

## 2016-04-26 LAB — SEDIMENTATION RATE: Sed Rate: 5 mm/hr (ref 0–20)

## 2016-04-26 MED ORDER — PREDNISONE 20 MG PO TABS: 20.0000 mg | ORAL_TABLET | Freq: Two times a day (BID) | ORAL | Status: DC

## 2016-04-26 MED ORDER — CEFDINIR 300 MG PO CAPS: 300.0000 mg | ORAL_CAPSULE | Freq: Two times a day (BID) | ORAL | Status: DC

## 2016-04-26 MED ORDER — CEFTRIAXONE SODIUM 500 MG IJ SOLR
500.0000 mg | INTRAMUSCULAR | Status: DC
  Administered 2016-04-26: 500 mg via INTRAMUSCULAR

## 2016-04-26 NOTE — ED Notes (Signed)
Right ear pain.

## 2016-04-26 NOTE — Discharge Instructions (Signed)
Take plain guaifenesin (1200mg  extended release tabs such as Mucinex) twice daily, with plenty of water, for cough and congestion.  Get adequate rest.   Continue albuterol by nebulizer at home as directed. Try warm salt water gargles for sore throat.  Stop all antihistamines for now, and other non-prescription cough/cold preparations. May take Tylenol as needed for pain. Follow-up with [place] [facility] as soon as possible.

## 2016-04-26 NOTE — ED Provider Notes (Signed)
CSN: [id number]     Arrival date & time 04/26/16  1939 History   First MD Initiated Contact with Patient 04/26/16 2029     Chief Complaint  Patient presents with  . Otalgia      HPI Comments: Patient presented to clinic yesterday complaining of right neck and ear pain, and right headache.  He presents today complaining of increased cough, chills, and increased right neck pain.  Yesterday he had a CT scan of his chest in a [facility] facility for follow-up of a nodule in his right apical region).  Sed Rate and C-reactive protein done in our facility yesterday were negative, making temporal arteritis less likely.  The history is provided by the patient and the spouse.    Past Medical History  Diagnosis Date  . Reflux   . COPD (chronic obstructive pulmonary disease) ([place])   . Spontaneous pneumothorax    Past Surgical History  Procedure Laterality Date  . Cholecystectomy    . Hernia repair     Family History  Problem Relation Age of Onset  . Heart attack Mother   . Heart attack Father    Social History  Substance Use Topics  . Smoking status: Former Research scientist (life sciences)  . Smokeless tobacco: Never Used  . Alcohol Use: No    Review of Systems  Constitutional: Positive for chills, diaphoresis, activity change, appetite change and fatigue. Negative for fever and unexpected weight change.  HENT: Positive for ear pain, sinus pressure, sore throat and trouble swallowing. Negative for congestion, ear discharge, facial swelling, hearing loss, mouth sores, nosebleeds, postnasal drip, rhinorrhea, sneezing, tinnitus and voice change.   Eyes: Negative.   Respiratory: Positive for cough.   Cardiovascular: Negative.   Gastrointestinal: Negative.   Musculoskeletal: Positive for neck pain.  Skin: Negative.   Neurological: Positive for headaches. Negative for facial asymmetry and speech difficulty.    Allergies  Hydromorphone hcl  Home Medications   Prior to Admission medications   Medication Sig Start  Date End Date Taking? Authorizing Provider  cefdinir (OMNICEF) 300 MG capsule Take 1 capsule (300 mg total) by mouth 2 (two) times daily. 04/26/16   Kandra Nicolas, MD  citalopram (CELEXA) 40 MG tablet Take 40 mg by mouth daily.    Historical Provider, MD  esomeprazole (NEXIUM) 40 MG capsule Take 40 mg by mouth daily at 12 noon.    Historical Provider, MD  predniSONE (DELTASONE) 20 MG tablet Take 1 tablet (20 mg total) by mouth 2 (two) times daily. Take with food. 04/26/16   Kandra Nicolas, MD  Propylene Glycol 0.6 % SOLN Apply to eye.    Historical Provider, MD  rosuvastatin (CRESTOR) 5 MG tablet Take 5 mg by mouth daily.    Historical Provider, MD  theophylline (THEO-24) 300 MG 24 hr capsule Take 300 mg by mouth daily.    Historical Provider, MD   Meds Ordered and Administered this Visit   Medications - No data to display  BP 132/73 mmHg  Pulse 68  Temp(Src) 99 F (37.2 C) (Oral)  Ht 5\' 11"  (1.803 m)  Wt 144 lb (65.318 kg)  BMI 20.09 kg/m2  SpO2 94% No data found.   Physical Exam  Constitutional: He is oriented to person, place, and time. He appears well-developed. No distress.  Patient appears uncomfortable, but in no acute distress.  HENT:  Head: Atraumatic.  Right Ear: External ear normal.  Left Ear: External ear normal.  Nose: Nose normal.  Pharynx erythematous and slightly edematous, more  pronounced on the right.  Eyes: Conjunctivae are normal. Pupils are equal, round, and reactive to light.  Neck: Normal range of motion. No thyromegaly present.  Persistent tender right cervical adenopathy as noted on exam yesterday 04/25/16.  Cardiovascular: Normal heart sounds.   Pulmonary/Chest: Effort normal. No stridor. No respiratory distress.  Diffuse scattered rhonchi posteriorly; moving air well.  Abdominal: Soft. There is no tenderness.  Musculoskeletal: He exhibits no edema.  Lymphadenopathy:    He has cervical adenopathy.  Neurological: He is alert and oriented to person,  place, and time.  Skin: Skin is warm and dry. No rash noted.  Nursing note and vitals reviewed.   ED Course  Procedures none      Imaging Review Review of CT chest w/o contrast done yesterday in a [facility] facility: 1)  Bilateral emphysematous changes similar to previous findings 2)  Mild mucous plugging and bronchiectasis in the medial lower lobes bilaterally with no change on the right and less mucus plugging on the left 3)  10 by 70mm groundglass opacity left lung base which is new and may be inflammatory but follow-up scan will be useful. 4)  Stable mild nodularity left adrenal gland and stable left parapelvic renal cyst.     MDM   1. COPD exacerbation (HCC)   2. Lymphadenopathy of right cervical region; concern for possible head/neck carcinoma    Administered Rocephin 500mg  IM.  Begin empiric Omnicef 300mg  BID, and prednisone burst. Take plain guaifenesin (1200mg  extended release tabs such as Mucinex) twice daily, with plenty of water, for cough and congestion.  Get adequate rest.   Continue albuterol by nebulizer at home as directed. Try warm salt water gargles for sore throat.  Stop all antihistamines for now, and other non-prescription cough/cold preparations. May take Tylenol as needed for pain. Follow-up with [place] [facility] as soon as possible.  If symptoms become significantly worse during the night or over the weekend, proceed to the local emergency room.     Kandra Nicolas, MD 04/28/16 1341

## 2016-04-28 ENCOUNTER — Telehealth: Payer: Self-pay | Admitting: Emergency Medicine

## 2016-04-28 LAB — GAMMA GT: GGT: 15 U/L (ref 7–51)

## 2016-04-28 NOTE — Telephone Encounter (Signed)
Per consult with Bill Hinton: told patient   After review of pt's medical records in [facility], it appears his Alk Phos was elevated in June of 2016 as well. For most recent labs, this is the only abnormal aspect of his labs. Please have him follow up with PCP for recheck of labs in a few weeks.    Please encourage him f/u with ENT to address symptoms that initially brought him to UC as recommended by Dr. Assunta Found.

## 2016-04-30 ENCOUNTER — Telehealth: Payer: Self-pay | Admitting: Emergency Medicine

## 2016-04-30 NOTE — Telephone Encounter (Signed)
           His normal GGT, in light of elevated alkaline phosphatase may indicate a disorder of bone metabolism; recommend referral to endocrinologist. Has he arranged an appointment yet with ENT for his right neck pain and lymphadenopathy? Spoke with patient's daughter : he has ENT appt tomorrow. Also told her suggestion for follow with his PCP and potential referral to endocrinologist. She will pick up copies of his labs for appts. pak

## 2016-05-01 DIAGNOSIS — S0300XA Dislocation of jaw, unspecified side, initial encounter: Secondary | ICD-10-CM | POA: Diagnosis not present

## 2016-05-02 DIAGNOSIS — J479 Bronchiectasis, uncomplicated: Secondary | ICD-10-CM | POA: Diagnosis not present

## 2016-05-02 DIAGNOSIS — J440 Chronic obstructive pulmonary disease with acute lower respiratory infection: Secondary | ICD-10-CM | POA: Diagnosis not present

## 2016-05-02 DIAGNOSIS — R918 Other nonspecific abnormal finding of lung field: Secondary | ICD-10-CM | POA: Diagnosis not present

## 2016-05-08 DIAGNOSIS — R6884 Jaw pain: Secondary | ICD-10-CM | POA: Diagnosis not present

## 2016-05-08 DIAGNOSIS — J449 Chronic obstructive pulmonary disease, unspecified: Secondary | ICD-10-CM | POA: Diagnosis not present

## 2016-05-08 DIAGNOSIS — R071 Chest pain on breathing: Secondary | ICD-10-CM | POA: Diagnosis not present

## 2016-05-08 DIAGNOSIS — R509 Fever, unspecified: Secondary | ICD-10-CM | POA: Diagnosis not present

## 2016-05-08 DIAGNOSIS — R938 Abnormal findings on diagnostic imaging of other specified body structures: Secondary | ICD-10-CM | POA: Diagnosis not present

## 2016-05-09 DIAGNOSIS — J449 Chronic obstructive pulmonary disease, unspecified: Secondary | ICD-10-CM | POA: Diagnosis not present

## 2016-05-09 DIAGNOSIS — G510 Bell's palsy: Secondary | ICD-10-CM | POA: Diagnosis not present

## 2016-05-09 DIAGNOSIS — G519 Disorder of facial nerve, unspecified: Secondary | ICD-10-CM | POA: Diagnosis not present

## 2016-05-09 DIAGNOSIS — E785 Hyperlipidemia, unspecified: Secondary | ICD-10-CM | POA: Diagnosis not present

## 2016-05-09 DIAGNOSIS — R29810 Facial weakness: Secondary | ICD-10-CM | POA: Diagnosis not present

## 2016-05-09 DIAGNOSIS — Z888 Allergy status to other drugs, medicaments and biological substances status: Secondary | ICD-10-CM | POA: Diagnosis not present

## 2016-05-09 DIAGNOSIS — R509 Fever, unspecified: Secondary | ICD-10-CM | POA: Diagnosis not present

## 2016-05-09 DIAGNOSIS — I2510 Atherosclerotic heart disease of native coronary artery without angina pectoris: Secondary | ICD-10-CM | POA: Diagnosis not present

## 2016-05-09 DIAGNOSIS — R4781 Slurred speech: Secondary | ICD-10-CM | POA: Diagnosis not present

## 2016-05-09 DIAGNOSIS — J929 Pleural plaque without asbestos: Secondary | ICD-10-CM | POA: Diagnosis not present

## 2016-05-09 DIAGNOSIS — Z87891 Personal history of nicotine dependence: Secondary | ICD-10-CM | POA: Diagnosis not present

## 2016-05-09 DIAGNOSIS — I6781 Acute cerebrovascular insufficiency: Secondary | ICD-10-CM | POA: Diagnosis not present

## 2016-05-15 DIAGNOSIS — G3184 Mild cognitive impairment, so stated: Secondary | ICD-10-CM | POA: Diagnosis not present

## 2016-05-15 DIAGNOSIS — D72829 Elevated white blood cell count, unspecified: Secondary | ICD-10-CM | POA: Diagnosis not present

## 2016-05-15 DIAGNOSIS — G510 Bell's palsy: Secondary | ICD-10-CM | POA: Diagnosis not present

## 2016-05-15 DIAGNOSIS — F39 Unspecified mood [affective] disorder: Secondary | ICD-10-CM | POA: Diagnosis not present

## 2016-05-17 DIAGNOSIS — G519 Disorder of facial nerve, unspecified: Secondary | ICD-10-CM | POA: Diagnosis not present

## 2016-05-17 DIAGNOSIS — G500 Trigeminal neuralgia: Secondary | ICD-10-CM | POA: Diagnosis not present

## 2016-05-17 DIAGNOSIS — G5603 Carpal tunnel syndrome, bilateral upper limbs: Secondary | ICD-10-CM | POA: Diagnosis not present

## 2016-05-17 DIAGNOSIS — G3184 Mild cognitive impairment, so stated: Secondary | ICD-10-CM | POA: Diagnosis not present

## 2016-05-17 DIAGNOSIS — R202 Paresthesia of skin: Secondary | ICD-10-CM | POA: Diagnosis not present

## 2016-05-17 DIAGNOSIS — G603 Idiopathic progressive neuropathy: Secondary | ICD-10-CM | POA: Diagnosis not present

## 2016-05-17 DIAGNOSIS — M5417 Radiculopathy, lumbosacral region: Secondary | ICD-10-CM | POA: Diagnosis not present

## 2016-05-17 DIAGNOSIS — R110 Nausea: Secondary | ICD-10-CM | POA: Diagnosis not present

## 2016-05-18 DIAGNOSIS — R1010 Upper abdominal pain, unspecified: Secondary | ICD-10-CM | POA: Diagnosis not present

## 2016-05-18 DIAGNOSIS — N281 Cyst of kidney, acquired: Secondary | ICD-10-CM | POA: Diagnosis not present

## 2016-05-18 DIAGNOSIS — R109 Unspecified abdominal pain: Secondary | ICD-10-CM | POA: Diagnosis not present

## 2016-05-18 DIAGNOSIS — D72829 Elevated white blood cell count, unspecified: Secondary | ICD-10-CM | POA: Diagnosis not present

## 2016-05-18 DIAGNOSIS — K5730 Diverticulosis of large intestine without perforation or abscess without bleeding: Secondary | ICD-10-CM | POA: Diagnosis not present

## 2016-05-18 DIAGNOSIS — R112 Nausea with vomiting, unspecified: Secondary | ICD-10-CM | POA: Diagnosis not present

## 2016-05-30 DIAGNOSIS — D72829 Elevated white blood cell count, unspecified: Secondary | ICD-10-CM | POA: Diagnosis not present

## 2016-06-01 ENCOUNTER — Emergency Department (INDEPENDENT_AMBULATORY_CARE_PROVIDER_SITE_OTHER)
Admission: EM | Admit: 2016-06-01 | Discharge: 2016-06-01 | Disposition: A | Discharge status: Home / Self Care | Payer: Medicare Other | Source: Home / Self Care | Attending: Family Medicine | Admitting: Family Medicine

## 2016-06-01 ENCOUNTER — Encounter: Payer: Self-pay | Admitting: Emergency Medicine

## 2016-06-01 DIAGNOSIS — K121 Other forms of stomatitis: Secondary | ICD-10-CM

## 2016-06-01 DIAGNOSIS — K140 Glossitis: Secondary | ICD-10-CM | POA: Diagnosis not present

## 2016-06-01 MED ORDER — MAGIC MOUTHWASH W/LIDOCAINE: 5.0000 mL | Freq: Three times a day (TID) | ORAL | 1 refills | Status: DC | PRN

## 2016-06-01 NOTE — ED Triage Notes (Signed)
Pt c/o blisters inside mouth x2 weeks. Painful and he has been unable to eat much. He did start 2 new medications on 05/17/16 and states he noticed this after starting meds.

## 2016-06-01 NOTE — Discharge Instructions (Signed)
Recommend taking a daily multivitamin 

## 2016-06-01 NOTE — ED Provider Notes (Signed)
[name]    CSN: [id number] Arrival date & time: 06/01/16  1108  First Provider Contact:  None       History   Chief Complaint Chief Complaint  Patient presents with  . Mouth Lesions    HPI Bill Hinton is a 82 y.o. male.   Patient was recently diagnosed with right side Bells Palsy in a local emergency room. He subsequently visited a neurologist who prescribed Valtrex and Acyclovir on 05/17/16.  He was also started on Memantine 10mg  (tapering to a final dose of BID) for his "memory." He complains of a two week history of soreness and "blisters" in the right side of his mouth.  The soreness started after he began taking his new medications.  He notes persistent weakness in his right face, but is able to close his right eyelids at night.  He feels well otherwise.   The history is provided by the patient and the spouse.    Past Medical History:  Diagnosis Date  . COPD (chronic obstructive pulmonary disease) ([place])   . Reflux   . Spontaneous pneumothorax     Patient Active Problem List   Diagnosis Date Noted  . CELLULITIS, [place] 01/01/2009  . CELLULITIS, HAND, RIGHT 01/01/2009  . HYPERLIPIDEMIA 12/30/2008  . COPD 12/30/2008  . GERD 12/30/2008    Past Surgical History:  Procedure Laterality Date  . CHOLECYSTECTOMY    . HERNIA REPAIR         Home Medications    Prior to Admission medications   Medication Sig Start Date End Date Taking? Authorizing Provider  citalopram (CELEXA) 40 MG tablet Take 40 mg by mouth daily.    Historical Provider, MD  esomeprazole (NEXIUM) 40 MG capsule Take 40 mg by mouth daily at 12 noon.    Historical Provider, MD  magic mouthwash w/lidocaine SOLN Take 5 mLs by mouth 3 (three) times daily as needed for mouth pain. Swish in mouth then expectorate 06/01/16   Kandra Nicolas, MD  Propylene Glycol 0.6 % SOLN Apply to eye.    Historical Provider, MD  rosuvastatin (CRESTOR) 5 MG tablet Take 5 mg by mouth daily.    Historical  Provider, MD  theophylline (THEO-24) 300 MG 24 hr capsule Take 300 mg by mouth daily.    Historical Provider, MD    Family History Family History  Problem Relation Age of Onset  . Heart attack Mother   . Heart attack Father     Social History Social History  Substance Use Topics  . Smoking status: Former Research scientist (life sciences)  . Smokeless tobacco: Never Used  . Alcohol use No     Allergies   Hydromorphone hcl   Review of Systems Review of Systems  Constitutional: Positive for appetite change and fatigue. Negative for activity change, chills, diaphoresis and fever.  HENT: Positive for mouth sores. Negative for ear pain, facial swelling, nosebleeds, postnasal drip, rhinorrhea, sinus pressure, sore throat and trouble swallowing.   Eyes:       Right eyelid weakness, otherwise negative.  Respiratory: Negative.   Cardiovascular: Negative.   Gastrointestinal: Negative.   Genitourinary: Negative.   Musculoskeletal: Negative.   Skin: Negative.   Neurological: Positive for facial asymmetry. Negative for dizziness, syncope, speech difficulty, weakness, numbness and headaches.  All other systems reviewed and are negative.    Physical Exam Triage Vital Signs ED Triage Vitals  Enc Vitals Group     BP 06/01/16 1129 104/63     Pulse Rate 06/01/16 1129 100  Resp --      Temp 06/01/16 1129 98.6 F (37 C)     Temp Source 06/01/16 1129 Oral     SpO2 06/01/16 1129 94 %     Weight 06/01/16 1129 135 lb (61.2 kg)     Height --      Head Circumference --      Peak Flow --      Pain Score 06/01/16 1132 4     Pain Loc --      Pain Edu? --      Excl. in [place]? --    No data found.   Updated Vital Signs BP 104/63 (BP Location: Right Arm)   Pulse 100   Temp 98.6 F (37 C) (Oral)   Wt 135 lb (61.2 kg)   SpO2 94%   BMI 18.83 kg/m   Visual Acuity Right Eye Distance:   Left Eye Distance:   Bilateral Distance:    Right Eye Near:   Left Eye Near:    Bilateral Near:     Physical Exam    Constitutional: He is oriented to person, place, and time. He appears well-developed and well-nourished. No distress.  HENT:  Head: Atraumatic.  Right Ear: External ear normal.  Left Ear: External ear normal.  Nose: Nose normal.  Mouth/Throat: Uvula is midline. No oropharyngeal exudate.  Right buccal mucosae and gingiva somewhat erythematous without specific lesions.  Tongue is smooth and mildly tender to palpation.  Eyes: Conjunctivae and EOM are normal. Pupils are equal, round, and reactive to light.  Neck: Neck supple.  Cardiovascular: Normal heart sounds.   Pulmonary/Chest: Breath sounds normal.  Abdominal: Bowel sounds are normal.  Musculoskeletal: He exhibits no edema.  Lymphadenopathy:    He has no cervical adenopathy.  Neurological: He is alert and oriented to person, place, and time. A cranial nerve deficit is present.  Note right upper and lower facial weakness; cranial nerves otherwise normal.  Skin: Skin is warm and dry. No rash noted.  Nursing note and vitals reviewed.    UC Treatments / Results  Labs (all labs ordered are listed, but only abnormal results are displayed)  Labs Reviewed - POCT KOH prep of mouth/tongue scraping:  No fungal elements present  EKG  EKG Interpretation None       Radiology No results found.  Procedures Procedures (including critical care time)  Medications Ordered in UC Medications - No data to display   Initial Impression / Assessment and Plan / UC Course  I have reviewed the triage vital signs and the nursing notes.  Pertinent labs & imaging results that were available during my care of the patient were reviewed by me and considered in my medical decision making (see chart for details).  Clinical Course   Suspect patient's right mouth pain and discomfort a result of his Bell's Palsy and likely Zoster infection (physical exam revealed no specific oral lesions).  His Glossitis may represent a subtle vitamin or trace  element deficiency. Doubt that his symptoms are a result of recently prescribed medications. Will begin symptomatic treatment with "Magic Mouthwash."   Recommend that he begin a multiple daily vitamin. Also recommend that he protect his right cornea with a lubricating eye solution several times daily, and apply a patch at night to ensure that his right eyelid remains closed. Recommend that he follow-up with his PCP if not improving in 2 weeks. Final Clinical Impressions(s) / UC Diagnoses   Final diagnoses:  Glossitis  Stomatitis  New Prescriptions Discharge Medication List as of 06/01/2016 12:52 PM    START taking these medications   Details  magic mouthwash w/lidocaine SOLN Take 5 mLs by mouth 3 (three) times daily as needed for mouth pain. Swish in mouth then expectorate, Starting Fri 06/01/2016, Print         Kandra Nicolas, MD 06/03/16 [phone]

## 2016-06-05 DIAGNOSIS — N390 Urinary tract infection, site not specified: Secondary | ICD-10-CM | POA: Diagnosis not present

## 2016-06-05 DIAGNOSIS — R5383 Other fatigue: Secondary | ICD-10-CM | POA: Diagnosis not present

## 2016-06-05 DIAGNOSIS — R413 Other amnesia: Secondary | ICD-10-CM | POA: Diagnosis not present

## 2016-06-05 DIAGNOSIS — G510 Bell's palsy: Secondary | ICD-10-CM | POA: Diagnosis not present

## 2016-06-05 DIAGNOSIS — R509 Fever, unspecified: Secondary | ICD-10-CM | POA: Diagnosis not present

## 2016-06-05 DIAGNOSIS — G3184 Mild cognitive impairment, so stated: Secondary | ICD-10-CM | POA: Diagnosis not present

## 2016-06-13 DIAGNOSIS — G3184 Mild cognitive impairment, so stated: Secondary | ICD-10-CM | POA: Diagnosis not present

## 2016-06-13 DIAGNOSIS — D72829 Elevated white blood cell count, unspecified: Secondary | ICD-10-CM | POA: Diagnosis not present

## 2016-06-14 DIAGNOSIS — G500 Trigeminal neuralgia: Secondary | ICD-10-CM | POA: Diagnosis not present

## 2016-06-14 DIAGNOSIS — R51 Headache: Secondary | ICD-10-CM | POA: Diagnosis not present

## 2016-06-14 DIAGNOSIS — G301 Alzheimer's disease with late onset: Secondary | ICD-10-CM | POA: Diagnosis not present

## 2016-06-14 DIAGNOSIS — G603 Idiopathic progressive neuropathy: Secondary | ICD-10-CM | POA: Diagnosis not present

## 2016-06-20 DIAGNOSIS — F418 Other specified anxiety disorders: Secondary | ICD-10-CM | POA: Diagnosis not present

## 2016-06-20 DIAGNOSIS — G3184 Mild cognitive impairment, so stated: Secondary | ICD-10-CM | POA: Diagnosis not present

## 2016-06-20 DIAGNOSIS — R413 Other amnesia: Secondary | ICD-10-CM | POA: Diagnosis not present

## 2016-06-20 DIAGNOSIS — K219 Gastro-esophageal reflux disease without esophagitis: Secondary | ICD-10-CM | POA: Diagnosis not present

## 2016-06-20 DIAGNOSIS — G510 Bell's palsy: Secondary | ICD-10-CM | POA: Diagnosis not present

## 2016-06-20 DIAGNOSIS — D72829 Elevated white blood cell count, unspecified: Secondary | ICD-10-CM | POA: Diagnosis not present

## 2016-06-20 DIAGNOSIS — E781 Pure hyperglyceridemia: Secondary | ICD-10-CM | POA: Diagnosis not present

## 2016-07-02 DIAGNOSIS — H353134 Nonexudative age-related macular degeneration, bilateral, advanced atrophic with subfoveal involvement: Secondary | ICD-10-CM | POA: Diagnosis not present

## 2016-07-02 DIAGNOSIS — H353194 Nonexudative age-related macular degeneration, unspecified eye, advanced atrophic with subfoveal involvement: Secondary | ICD-10-CM | POA: Diagnosis not present

## 2016-07-02 DIAGNOSIS — G510 Bell's palsy: Secondary | ICD-10-CM | POA: Diagnosis not present

## 2016-07-17 DIAGNOSIS — Z23 Encounter for immunization: Secondary | ICD-10-CM | POA: Diagnosis not present

## 2016-07-27 DIAGNOSIS — J019 Acute sinusitis, unspecified: Secondary | ICD-10-CM | POA: Diagnosis not present

## 2016-07-27 DIAGNOSIS — R938 Abnormal findings on diagnostic imaging of other specified body structures: Secondary | ICD-10-CM | POA: Diagnosis not present

## 2016-07-27 DIAGNOSIS — J441 Chronic obstructive pulmonary disease with (acute) exacerbation: Secondary | ICD-10-CM | POA: Diagnosis not present

## 2016-07-27 DIAGNOSIS — J479 Bronchiectasis, uncomplicated: Secondary | ICD-10-CM | POA: Diagnosis not present

## 2016-07-27 DIAGNOSIS — G510 Bell's palsy: Secondary | ICD-10-CM | POA: Diagnosis not present

## 2016-08-01 DIAGNOSIS — J441 Chronic obstructive pulmonary disease with (acute) exacerbation: Secondary | ICD-10-CM | POA: Diagnosis not present

## 2016-08-01 DIAGNOSIS — D72829 Elevated white blood cell count, unspecified: Secondary | ICD-10-CM | POA: Diagnosis not present

## 2016-08-01 DIAGNOSIS — G3184 Mild cognitive impairment, so stated: Secondary | ICD-10-CM | POA: Diagnosis not present

## 2016-08-01 DIAGNOSIS — G510 Bell's palsy: Secondary | ICD-10-CM | POA: Diagnosis not present

## 2016-08-01 DIAGNOSIS — R413 Other amnesia: Secondary | ICD-10-CM | POA: Diagnosis not present

## 2016-08-01 DIAGNOSIS — R2689 Other abnormalities of gait and mobility: Secondary | ICD-10-CM | POA: Diagnosis not present

## 2016-08-01 DIAGNOSIS — E782 Mixed hyperlipidemia: Secondary | ICD-10-CM | POA: Diagnosis not present

## 2016-08-10 DIAGNOSIS — R918 Other nonspecific abnormal finding of lung field: Secondary | ICD-10-CM | POA: Diagnosis not present

## 2016-08-10 DIAGNOSIS — J440 Chronic obstructive pulmonary disease with acute lower respiratory infection: Secondary | ICD-10-CM | POA: Diagnosis not present

## 2016-08-10 DIAGNOSIS — R938 Abnormal findings on diagnostic imaging of other specified body structures: Secondary | ICD-10-CM | POA: Diagnosis not present

## 2016-08-17 DIAGNOSIS — R1310 Dysphagia, unspecified: Secondary | ICD-10-CM | POA: Insufficient documentation

## 2016-08-17 DIAGNOSIS — J300 Vasomotor rhinitis: Secondary | ICD-10-CM | POA: Insufficient documentation

## 2016-08-17 DIAGNOSIS — J449 Chronic obstructive pulmonary disease, unspecified: Secondary | ICD-10-CM | POA: Diagnosis not present

## 2016-08-17 DIAGNOSIS — K219 Gastro-esophageal reflux disease without esophagitis: Secondary | ICD-10-CM | POA: Diagnosis not present

## 2016-08-17 DIAGNOSIS — J471 Bronchiectasis with (acute) exacerbation: Secondary | ICD-10-CM | POA: Diagnosis not present

## 2016-08-23 DIAGNOSIS — R201 Hypoesthesia of skin: Secondary | ICD-10-CM | POA: Diagnosis not present

## 2016-08-23 DIAGNOSIS — M5412 Radiculopathy, cervical region: Secondary | ICD-10-CM | POA: Diagnosis not present

## 2016-08-23 DIAGNOSIS — G301 Alzheimer's disease with late onset: Secondary | ICD-10-CM | POA: Diagnosis not present

## 2016-08-23 DIAGNOSIS — R202 Paresthesia of skin: Secondary | ICD-10-CM | POA: Diagnosis not present

## 2016-08-28 DIAGNOSIS — K219 Gastro-esophageal reflux disease without esophagitis: Secondary | ICD-10-CM | POA: Diagnosis not present

## 2016-08-28 DIAGNOSIS — R634 Abnormal weight loss: Secondary | ICD-10-CM | POA: Diagnosis not present

## 2016-08-28 DIAGNOSIS — G510 Bell's palsy: Secondary | ICD-10-CM | POA: Diagnosis not present

## 2016-08-28 DIAGNOSIS — R1312 Dysphagia, oropharyngeal phase: Secondary | ICD-10-CM | POA: Diagnosis not present

## 2016-08-28 DIAGNOSIS — R1310 Dysphagia, unspecified: Secondary | ICD-10-CM | POA: Diagnosis not present

## 2016-08-29 DIAGNOSIS — R0602 Shortness of breath: Secondary | ICD-10-CM | POA: Diagnosis not present

## 2016-08-29 DIAGNOSIS — J479 Bronchiectasis, uncomplicated: Secondary | ICD-10-CM | POA: Diagnosis not present

## 2016-08-29 DIAGNOSIS — J449 Chronic obstructive pulmonary disease, unspecified: Secondary | ICD-10-CM | POA: Diagnosis not present

## 2016-08-29 DIAGNOSIS — R05 Cough: Secondary | ICD-10-CM | POA: Diagnosis not present

## 2016-09-04 ENCOUNTER — Emergency Department (INDEPENDENT_AMBULATORY_CARE_PROVIDER_SITE_OTHER)
Admission: EM | Admit: 2016-09-04 | Discharge: 2016-09-04 | Disposition: A | Discharge status: Home / Self Care | Payer: Medicare Other | Source: Home / Self Care | Attending: Family Medicine | Admitting: Family Medicine

## 2016-09-04 ENCOUNTER — Encounter: Payer: Self-pay | Admitting: *Deleted

## 2016-09-04 DIAGNOSIS — T148XXA Other injury of unspecified body region, initial encounter: Secondary | ICD-10-CM | POA: Diagnosis not present

## 2016-09-04 DIAGNOSIS — Z862 Personal history of diseases of the blood and blood-forming organs and certain disorders involving the immune mechanism: Secondary | ICD-10-CM

## 2016-09-04 LAB — POCT CBC W AUTO DIFF (K'VILLE URGENT CARE)

## 2016-09-04 LAB — PROTIME-INR
INR: 1
Prothrombin Time: 10.7 s (ref 9.0–11.5)

## 2016-09-04 LAB — APTT: aPTT: 28 s (ref 22–34)

## 2016-09-04 NOTE — ED Triage Notes (Signed)
Pt c/o RT arm abrasion x 1 day. His wife also reports seeing blood on his shirt on his RLQ abd area, but did not see an abrasion. Denies falling.

## 2016-09-04 NOTE — Discharge Instructions (Signed)
Keep wound clean and dry.  Keep wound covered with non-stick dressing.   Return for any signs of infection (or follow-up with family doctor):  Increasing redness, swelling, pain, heat, drainage, etc. Follow instructions on Dermabond information sheet.

## 2016-09-04 NOTE — ED Provider Notes (Signed)
[facility]    CSN: [id number] Arrival date & time: 09/04/16  1113     History   Chief Complaint Chief Complaint  Patient presents with  . Abrasion    HPI Marvins Starratt is a 82 y.o. male.   Patient complains of one day history of several skin tears on his right upper arm.  He does not recall an injury.  His wife is concerned about recurring bruises.  His last Tdap was administered 11/03/13. He has a history of persistent leukocytosis.     The history is provided by the patient and the spouse.  Laceration  Location: right upper arm. Length:  4cm, 3cm, 3cm Depth:  Cutaneous Quality: straight   Bleeding: controlled   Time since incident:  1 day Laceration mechanism:  Unable to specify Pain details:    Severity:  No pain Foreign body present:  No foreign bodies Relieved by:  None tried Worsened by:  Movement Ineffective treatments:  None tried Tetanus status:  Up to date Associated symptoms: no focal weakness, no redness and no swelling     Past Medical History:  Diagnosis Date  . COPD (chronic obstructive pulmonary disease) ([place])   . Reflux   . Spontaneous pneumothorax     Patient Active Problem List   Diagnosis Date Noted  . CELLULITIS, [place] 01/01/2009  . CELLULITIS, HAND, RIGHT 01/01/2009  . HYPERLIPIDEMIA 12/30/2008  . COPD 12/30/2008  . GERD 12/30/2008    Past Surgical History:  Procedure Laterality Date  . CHOLECYSTECTOMY    . HERNIA REPAIR         Home Medications    Prior to Admission medications   Medication Sig Start Date End Date Taking? Authorizing Provider  citalopram (CELEXA) 40 MG tablet Take 40 mg by mouth daily.    Historical Provider, MD  esomeprazole (NEXIUM) 40 MG capsule Take 40 mg by mouth daily at 12 noon.    Historical Provider, MD  magic mouthwash w/lidocaine SOLN Take 5 mLs by mouth 3 (three) times daily as needed for mouth pain. Swish in mouth then expectorate 06/01/16   Kandra Nicolas, MD  Propylene Glycol  0.6 % SOLN Apply to eye.    Historical Provider, MD  rosuvastatin (CRESTOR) 5 MG tablet Take 5 mg by mouth daily.    Historical Provider, MD  theophylline (THEO-24) 300 MG 24 hr capsule Take 300 mg by mouth daily.    Historical Provider, MD    Family History Family History  Problem Relation Age of Onset  . Heart attack Mother   . Heart attack Father     Social History Social History  Substance Use Topics  . Smoking status: Former Research scientist (life sciences)  . Smokeless tobacco: Never Used  . Alcohol use No     Allergies   Hydromorphone hcl   Review of Systems Review of Systems  Neurological: Negative for focal weakness.  All other systems reviewed and are negative.    Physical Exam Triage Vital Signs ED Triage Vitals  Enc Vitals Group     BP 09/04/16 1133 135/77     Pulse Rate 09/04/16 1133 84     Resp 09/04/16 1133 16     Temp 09/04/16 1133 98.7 F (37.1 C)     Temp Source 09/04/16 1133 Oral     SpO2 09/04/16 1133 94 %     Weight 09/04/16 1133 142 lb (64.4 kg)     Height 09/04/16 1133 5\' 9"  (1.753 m)     Head Circumference --  Peak Flow --      Pain Score 09/04/16 1134 0     Pain Loc --      Pain Edu? --      Excl. in [place]? --    No data found.   Updated Vital Signs BP 135/77 (BP Location: Left Arm)   Pulse 84   Temp 98.7 F (37.1 C) (Oral)   Resp 16   Ht 5\' 9"  (1.753 m)   Wt 142 lb (64.4 kg)   SpO2 94%   BMI 20.97 kg/m   Visual Acuity Right Eye Distance:   Left Eye Distance:   Bilateral Distance:    Right Eye Near:   Left Eye Near:    Bilateral Near:     Physical Exam  Constitutional: He appears well-developed and well-nourished. No distress.  HENT:  Head: Atraumatic.  Nose: Nose normal.  Mouth/Throat: Oropharynx is clear and moist.  Eyes: Conjunctivae are normal. Pupils are equal, round, and reactive to light.  Neck: Normal range of motion.  Cardiovascular: Normal heart sounds.   Pulmonary/Chest: Breath sounds normal.  Abdominal: There is no  tenderness.  Musculoskeletal: He exhibits no edema.       Arms: Patient has three superficial skin tears (measuring 4cm, 3cm, and 3cm) on his right upper arm as noted on diagram.  No evidence cellulitis.  Distal neurovascular function is intact.  Right forearm has areas of ecchymosis without tenderness to palpation.  Neurological: He is alert.  Skin: Skin is warm and dry.  Scattered ecchymoses present.  Nursing note and vitals reviewed.    UC Treatments / Results  Labs (all labs ordered are listed, but only abnormal results are displayed) Labs Reviewed  PROTIME-INR  APTT  POCT CBC W AUTO DIFF ([facility]):  WBC 15.7; LY 9.6; MO 12.0; GR 78.4; Hgb 15.1; Platelets 185   Chart review reveals that patient's most recent CBC at [facility] showed leukocytosis 12.0  EKG  EKG Interpretation None       Radiology No results found.  Procedures Procedures Skin tear repair (Dermabond) Discussed benefits and risks of procedure and verbal consent obtained. Using sterile technique, cleansed wound with saline.    Wounds carefully inspected for debris and foreign bodies; none found.  Wound edges carefully approximated in normal anatomic position and closed with Dermabond.  Laceration repairs protected with layer of non-stick gauze and wrap of [place].  Wound precautions explained to patient and his wife.      Medications Ordered in UC  Medications - No data to display   Initial Impression / Assessment and Plan / UC Course  I have reviewed the triage vital signs and the nursing notes.  Pertinent labs & imaging results that were available during my care of the patient were reviewed by me and considered in my medical decision making (see chart for details).  Clinical Course   Keep wounds clean and dry.  Keep wounds covered with non-stick dressing.   Return for any signs of infection (or follow-up with family doctor):  Increasing redness, swelling, pain, heat,  drainage, etc. Follow instructions on Dermabond information sheet.  For excessive bruising:  Will check PT and PTT. Leukocytosis appears to be stable; follow-up with PCP for management.    Final Clinical Impressions(s) / UC Diagnoses   Final diagnoses:  Multiple skin tears  History of leukocytosis    New Prescriptions Discharge Medication List as of 09/04/2016  1:16 PM       Ishmael Holter  Assunta Found, MD 09/07/16 [phone]

## 2016-09-10 ENCOUNTER — Telehealth: Payer: Self-pay | Admitting: *Deleted

## 2016-09-10 NOTE — Telephone Encounter (Signed)
Lab results from 09/04/16 given and discussed.

## 2016-09-17 DIAGNOSIS — G2581 Restless legs syndrome: Secondary | ICD-10-CM | POA: Diagnosis present

## 2016-09-17 DIAGNOSIS — R509 Fever, unspecified: Secondary | ICD-10-CM | POA: Diagnosis not present

## 2016-09-17 DIAGNOSIS — F329 Major depressive disorder, single episode, unspecified: Secondary | ICD-10-CM | POA: Diagnosis present

## 2016-09-17 DIAGNOSIS — J440 Chronic obstructive pulmonary disease with acute lower respiratory infection: Secondary | ICD-10-CM | POA: Diagnosis present

## 2016-09-17 DIAGNOSIS — G510 Bell's palsy: Secondary | ICD-10-CM | POA: Diagnosis present

## 2016-09-17 DIAGNOSIS — R918 Other nonspecific abnormal finding of lung field: Secondary | ICD-10-CM | POA: Diagnosis not present

## 2016-09-17 DIAGNOSIS — Z87442 Personal history of urinary calculi: Secondary | ICD-10-CM | POA: Diagnosis not present

## 2016-09-17 DIAGNOSIS — D72829 Elevated white blood cell count, unspecified: Secondary | ICD-10-CM | POA: Diagnosis not present

## 2016-09-17 DIAGNOSIS — J9601 Acute respiratory failure with hypoxia: Secondary | ICD-10-CM | POA: Insufficient documentation

## 2016-09-17 DIAGNOSIS — K219 Gastro-esophageal reflux disease without esophagitis: Secondary | ICD-10-CM | POA: Diagnosis present

## 2016-09-17 DIAGNOSIS — J479 Bronchiectasis, uncomplicated: Secondary | ICD-10-CM | POA: Diagnosis not present

## 2016-09-17 DIAGNOSIS — I10 Essential (primary) hypertension: Secondary | ICD-10-CM | POA: Diagnosis not present

## 2016-09-17 DIAGNOSIS — R0602 Shortness of breath: Secondary | ICD-10-CM | POA: Diagnosis not present

## 2016-09-17 DIAGNOSIS — J441 Chronic obstructive pulmonary disease with (acute) exacerbation: Secondary | ICD-10-CM | POA: Diagnosis not present

## 2016-09-17 DIAGNOSIS — Z885 Allergy status to narcotic agent status: Secondary | ICD-10-CM | POA: Diagnosis not present

## 2016-09-17 DIAGNOSIS — J929 Pleural plaque without asbestos: Secondary | ICD-10-CM | POA: Diagnosis not present

## 2016-09-17 DIAGNOSIS — E785 Hyperlipidemia, unspecified: Secondary | ICD-10-CM | POA: Diagnosis present

## 2016-09-17 DIAGNOSIS — R7989 Other specified abnormal findings of blood chemistry: Secondary | ICD-10-CM | POA: Diagnosis present

## 2016-09-17 DIAGNOSIS — R300 Dysuria: Secondary | ICD-10-CM | POA: Diagnosis present

## 2016-09-17 DIAGNOSIS — F0280 Dementia in other diseases classified elsewhere without behavioral disturbance: Secondary | ICD-10-CM | POA: Diagnosis not present

## 2016-09-17 DIAGNOSIS — I2510 Atherosclerotic heart disease of native coronary artery without angina pectoris: Secondary | ICD-10-CM | POA: Diagnosis present

## 2016-09-17 DIAGNOSIS — J471 Bronchiectasis with (acute) exacerbation: Secondary | ICD-10-CM | POA: Diagnosis not present

## 2016-09-17 DIAGNOSIS — Z7951 Long term (current) use of inhaled steroids: Secondary | ICD-10-CM | POA: Diagnosis not present

## 2016-09-17 DIAGNOSIS — E119 Type 2 diabetes mellitus without complications: Secondary | ICD-10-CM | POA: Diagnosis present

## 2016-09-17 DIAGNOSIS — E876 Hypokalemia: Secondary | ICD-10-CM | POA: Diagnosis present

## 2016-09-17 DIAGNOSIS — Z87891 Personal history of nicotine dependence: Secondary | ICD-10-CM | POA: Diagnosis not present

## 2016-09-17 DIAGNOSIS — J449 Chronic obstructive pulmonary disease, unspecified: Secondary | ICD-10-CM | POA: Diagnosis not present

## 2016-09-17 DIAGNOSIS — Z888 Allergy status to other drugs, medicaments and biological substances status: Secondary | ICD-10-CM | POA: Diagnosis not present

## 2016-09-17 DIAGNOSIS — J156 Pneumonia due to other aerobic Gram-negative bacteria: Secondary | ICD-10-CM | POA: Diagnosis not present

## 2016-09-19 DIAGNOSIS — G2581 Restless legs syndrome: Secondary | ICD-10-CM | POA: Insufficient documentation

## 2016-09-19 DIAGNOSIS — Y95 Nosocomial condition: Secondary | ICD-10-CM

## 2016-09-19 DIAGNOSIS — J189 Pneumonia, unspecified organism: Secondary | ICD-10-CM | POA: Insufficient documentation

## 2016-09-26 DIAGNOSIS — J441 Chronic obstructive pulmonary disease with (acute) exacerbation: Secondary | ICD-10-CM | POA: Diagnosis not present

## 2016-09-26 DIAGNOSIS — R5381 Other malaise: Secondary | ICD-10-CM | POA: Diagnosis not present

## 2016-09-26 DIAGNOSIS — G3184 Mild cognitive impairment, so stated: Secondary | ICD-10-CM | POA: Diagnosis not present

## 2016-09-26 DIAGNOSIS — G2581 Restless legs syndrome: Secondary | ICD-10-CM | POA: Diagnosis not present

## 2016-09-26 DIAGNOSIS — J449 Chronic obstructive pulmonary disease, unspecified: Secondary | ICD-10-CM | POA: Diagnosis not present

## 2016-09-26 DIAGNOSIS — R600 Localized edema: Secondary | ICD-10-CM | POA: Diagnosis not present

## 2016-10-04 DIAGNOSIS — J479 Bronchiectasis, uncomplicated: Secondary | ICD-10-CM | POA: Diagnosis not present

## 2016-10-04 DIAGNOSIS — G510 Bell's palsy: Secondary | ICD-10-CM | POA: Diagnosis not present

## 2016-10-04 DIAGNOSIS — J449 Chronic obstructive pulmonary disease, unspecified: Secondary | ICD-10-CM | POA: Diagnosis not present

## 2016-10-10 DIAGNOSIS — G2581 Restless legs syndrome: Secondary | ICD-10-CM | POA: Diagnosis not present

## 2016-10-10 DIAGNOSIS — J441 Chronic obstructive pulmonary disease with (acute) exacerbation: Secondary | ICD-10-CM | POA: Diagnosis not present

## 2016-10-15 DIAGNOSIS — K219 Gastro-esophageal reflux disease without esophagitis: Secondary | ICD-10-CM | POA: Diagnosis not present

## 2016-10-15 DIAGNOSIS — E785 Hyperlipidemia, unspecified: Secondary | ICD-10-CM | POA: Diagnosis not present

## 2016-10-15 DIAGNOSIS — Z7951 Long term (current) use of inhaled steroids: Secondary | ICD-10-CM | POA: Diagnosis not present

## 2016-10-15 DIAGNOSIS — J441 Chronic obstructive pulmonary disease with (acute) exacerbation: Secondary | ICD-10-CM | POA: Diagnosis not present

## 2016-10-15 DIAGNOSIS — I2510 Atherosclerotic heart disease of native coronary artery without angina pectoris: Secondary | ICD-10-CM | POA: Diagnosis not present

## 2016-10-15 DIAGNOSIS — Z79899 Other long term (current) drug therapy: Secondary | ICD-10-CM | POA: Diagnosis not present

## 2016-10-15 DIAGNOSIS — R079 Chest pain, unspecified: Secondary | ICD-10-CM | POA: Diagnosis not present

## 2016-10-15 DIAGNOSIS — R918 Other nonspecific abnormal finding of lung field: Secondary | ICD-10-CM | POA: Diagnosis not present

## 2016-10-15 DIAGNOSIS — E119 Type 2 diabetes mellitus without complications: Secondary | ICD-10-CM | POA: Diagnosis not present

## 2016-10-15 DIAGNOSIS — J449 Chronic obstructive pulmonary disease, unspecified: Secondary | ICD-10-CM | POA: Diagnosis not present

## 2016-10-15 DIAGNOSIS — R0602 Shortness of breath: Secondary | ICD-10-CM | POA: Diagnosis not present

## 2016-10-15 DIAGNOSIS — Z885 Allergy status to narcotic agent status: Secondary | ICD-10-CM | POA: Diagnosis not present

## 2016-10-15 DIAGNOSIS — Z888 Allergy status to other drugs, medicaments and biological substances status: Secondary | ICD-10-CM | POA: Diagnosis not present

## 2016-10-15 DIAGNOSIS — Z87891 Personal history of nicotine dependence: Secondary | ICD-10-CM | POA: Diagnosis not present

## 2016-10-15 DIAGNOSIS — Z87442 Personal history of urinary calculi: Secondary | ICD-10-CM | POA: Diagnosis not present

## 2016-10-15 DIAGNOSIS — R531 Weakness: Secondary | ICD-10-CM | POA: Diagnosis not present

## 2016-10-18 DIAGNOSIS — R0902 Hypoxemia: Secondary | ICD-10-CM | POA: Diagnosis not present

## 2016-10-18 DIAGNOSIS — J449 Chronic obstructive pulmonary disease, unspecified: Secondary | ICD-10-CM | POA: Diagnosis not present

## 2016-10-18 DIAGNOSIS — J479 Bronchiectasis, uncomplicated: Secondary | ICD-10-CM | POA: Diagnosis not present

## 2016-10-24 DIAGNOSIS — G2581 Restless legs syndrome: Secondary | ICD-10-CM | POA: Diagnosis not present

## 2016-11-14 DIAGNOSIS — L089 Local infection of the skin and subcutaneous tissue, unspecified: Secondary | ICD-10-CM | POA: Diagnosis not present

## 2016-11-14 DIAGNOSIS — S61439A Puncture wound without foreign body of unspecified hand, initial encounter: Secondary | ICD-10-CM | POA: Diagnosis not present

## 2016-11-15 DIAGNOSIS — G301 Alzheimer's disease with late onset: Secondary | ICD-10-CM | POA: Diagnosis not present

## 2016-11-15 DIAGNOSIS — G603 Idiopathic progressive neuropathy: Secondary | ICD-10-CM | POA: Diagnosis not present

## 2016-11-15 DIAGNOSIS — J9601 Acute respiratory failure with hypoxia: Secondary | ICD-10-CM | POA: Diagnosis not present

## 2016-11-15 DIAGNOSIS — J449 Chronic obstructive pulmonary disease, unspecified: Secondary | ICD-10-CM | POA: Diagnosis not present

## 2016-11-15 DIAGNOSIS — G500 Trigeminal neuralgia: Secondary | ICD-10-CM | POA: Diagnosis not present

## 2016-11-15 DIAGNOSIS — R0902 Hypoxemia: Secondary | ICD-10-CM | POA: Diagnosis not present

## 2016-11-15 DIAGNOSIS — J479 Bronchiectasis, uncomplicated: Secondary | ICD-10-CM | POA: Diagnosis not present

## 2016-11-15 DIAGNOSIS — R270 Ataxia, unspecified: Secondary | ICD-10-CM | POA: Diagnosis not present

## 2016-12-12 DIAGNOSIS — J479 Bronchiectasis, uncomplicated: Secondary | ICD-10-CM | POA: Diagnosis not present

## 2016-12-12 DIAGNOSIS — J209 Acute bronchitis, unspecified: Secondary | ICD-10-CM | POA: Diagnosis not present

## 2016-12-12 DIAGNOSIS — J441 Chronic obstructive pulmonary disease with (acute) exacerbation: Secondary | ICD-10-CM | POA: Diagnosis not present

## 2016-12-14 ENCOUNTER — Encounter: Payer: Self-pay | Admitting: Osteopathic Medicine

## 2016-12-14 ENCOUNTER — Ambulatory Visit (INDEPENDENT_AMBULATORY_CARE_PROVIDER_SITE_OTHER): Payer: Medicare Other | Admitting: Osteopathic Medicine

## 2016-12-14 VITALS — BP 152/67 | HR 71 | Ht 68.5 in | Wt 151.0 lb

## 2016-12-14 DIAGNOSIS — F418 Other specified anxiety disorders: Secondary | ICD-10-CM | POA: Diagnosis not present

## 2016-12-14 DIAGNOSIS — J3489 Other specified disorders of nose and nasal sinuses: Secondary | ICD-10-CM

## 2016-12-14 DIAGNOSIS — K449 Diaphragmatic hernia without obstruction or gangrene: Secondary | ICD-10-CM

## 2016-12-14 DIAGNOSIS — J939 Pneumothorax, unspecified: Secondary | ICD-10-CM | POA: Insufficient documentation

## 2016-12-14 DIAGNOSIS — I10 Essential (primary) hypertension: Secondary | ICD-10-CM | POA: Diagnosis not present

## 2016-12-14 DIAGNOSIS — E785 Hyperlipidemia, unspecified: Secondary | ICD-10-CM | POA: Diagnosis not present

## 2016-12-14 DIAGNOSIS — J300 Vasomotor rhinitis: Secondary | ICD-10-CM | POA: Diagnosis not present

## 2016-12-14 DIAGNOSIS — R400 Somnolence: Secondary | ICD-10-CM | POA: Diagnosis not present

## 2016-12-14 DIAGNOSIS — J479 Bronchiectasis, uncomplicated: Secondary | ICD-10-CM

## 2016-12-14 DIAGNOSIS — J449 Chronic obstructive pulmonary disease, unspecified: Secondary | ICD-10-CM

## 2016-12-14 DIAGNOSIS — N400 Enlarged prostate without lower urinary tract symptoms: Secondary | ICD-10-CM | POA: Insufficient documentation

## 2016-12-14 DIAGNOSIS — R5383 Other fatigue: Secondary | ICD-10-CM | POA: Diagnosis not present

## 2016-12-14 DIAGNOSIS — D72829 Elevated white blood cell count, unspecified: Secondary | ICD-10-CM

## 2016-12-14 DIAGNOSIS — K219 Gastro-esophageal reflux disease without esophagitis: Secondary | ICD-10-CM | POA: Diagnosis not present

## 2016-12-14 DIAGNOSIS — E559 Vitamin D deficiency, unspecified: Secondary | ICD-10-CM | POA: Insufficient documentation

## 2016-12-14 DIAGNOSIS — I2510 Atherosclerotic heart disease of native coronary artery without angina pectoris: Secondary | ICD-10-CM | POA: Diagnosis not present

## 2016-12-14 DIAGNOSIS — H9319 Tinnitus, unspecified ear: Secondary | ICD-10-CM | POA: Insufficient documentation

## 2016-12-14 DIAGNOSIS — Z87442 Personal history of urinary calculi: Secondary | ICD-10-CM | POA: Insufficient documentation

## 2016-12-14 MED ORDER — LOSARTAN POTASSIUM 25 MG PO TABS: 25.0000 mg | ORAL_TABLET | Freq: Every day | ORAL | 1 refills | Status: DC

## 2016-12-14 NOTE — Patient Instructions (Addendum)
Plan:  Will get neurology records and consider stopping Quetiapine  Take Ropinirole once daily (at bedtime) or as needed for leg shaking during the day  Would talk to your lung doctors about continuous use of oxygen  Try switch to Allegra and stop Claritin   Be using Atrovent nasal spray twice daily (from lung doctor)   Plan to get labs prior to next visit  Will try new blood pressure medication: Losartan

## 2016-12-14 NOTE — Progress Notes (Addendum)
HPI: Bill Hinton is a 83 y.o. male  who presents to [place] today, 12/14/16,  for chief complaint of:  Chief Complaint  Patient presents with  . Establish Care     CARDIOVASCULAR Coronary artery disease listed on problem list. No history of MI. Blood pressure elevated today in the office, per record review was high at last pulmonology visit for COPD exacerbation but prior to that was 130/62. He does not want to be on blood pressure medications  RESPIRATORY Following with pulmonology, last visit 12/12/2016 for COPD exacerbation, treated with IM Depo-Medrol 80, prednisone 12 day taper, Levaquin 750 mg daily 10 days and to follow-up in 4 weeks Severe COPD and bronchiectasis. Sounds like he is not compliant with oxygen based on pulmonology note 11/15/2016. Patient states he does not have a good portable device for leaving the home but in the house he has a long tube and this enables him to walk throughout the house with oxygen if needed Concern for persistent clear rhinorrhea bilaterally. Vasomotor rhinorrhea is listed on his problem list  NEUROLOGICAL Several prescriptions from neurologist but no records available for review. Prescriptions include Seroquel, Namenda, Aricept. Uncertain if patient is being treated for severe depression, PCP was prescribing citalopram in addition to the Seroquel. States he has no problems with depression at this time. States that there was some kind of evaluation for dementia as well but cannot recall when. Restless legs, severe, patient has occasionally been using medication in the daytime and this seems to make him sleepy. Sleeping a lot during the day and family would like to know if any medications may be causing this.    Past medical, surgical, social and family history reviewed: Patient Active Problem List   Diagnosis Date Noted  . Benign prostatic hyperplasia 12/14/2016  . CAD (coronary artery disease), native  coronary artery 12/14/2016  . Depression with anxiety 12/14/2016  . History of nephrolithiasis 12/14/2016  . Pneumothorax 12/14/2016  . Tinnitus 12/14/2016  . Vitamin D deficiency 12/14/2016  . Declining functional status 09/26/2016  . Pneumonia of both lungs due to infectious organism 09/19/2016  . Restless leg syndrome 09/19/2016  . Acute respiratory failure with hypoxia ([place]) 09/17/2016  . Bell's palsy 09/17/2016  . Bronchiectasis ([place]) 09/17/2016  . Mounier-Kuhn bronchiectasis with acute exacerbation ([place]) 08/17/2016  . Dysphagia 08/17/2016  . Vasomotor rhinitis 08/17/2016  . Right ureteral calculus 04/13/2015  . Atherosclerotic heart disease of native coronary artery without angina pectoris 04/11/2015  . Chronic kidney disease 04/11/2015  . Diaphragmatic hernia without obstruction or gangrene 04/11/2015  . Enlarged prostate without lower urinary tract symptoms ([place]) 04/11/2015  . Essential (primary) hypertension 04/11/2015  . Nocturia 04/11/2015  . Other seasonal allergic rhinitis 04/11/2015  . Panic disorder without agoraphobia 04/11/2015  . Status post cataract extraction 01/21/2014  . Presence of intraocular lens 01/21/2014  . Pseudophakia of both eyes 01/21/2014  . Nuclear sclerosis of right eye 01/19/2014  . 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 Pterygium of left eye 05/29/2012  . CELLULITIS, [place] 01/01/2009  . CELLULITIS, HAND, RIGHT 01/01/2009  . HLD (hyperlipidemia) 12/30/2008  . 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 Gastroesophageal reflux disease without esophagitis 12/30/2008   Past Surgical History:  Procedure Laterality Date  . CHOLECYSTECTOMY    . HERNIA REPAIR     Social History  Substance Use Topics  . Smoking status: Former Research scientist (life sciences)  . Smokeless tobacco: Never Used  .  Alcohol use No   Family History  Problem Relation Age of Onset  . Heart attack Mother   . Heart  attack Father      Current medication list and allergy/intolerance information reviewed:   Current Outpatient Prescriptions  Medication Sig Dispense Refill  . citalopram (CELEXA) 40 MG tablet Take 40 mg by mouth daily.    Marland Kitchen esomeprazole (NEXIUM) 40 MG capsule Take 40 mg by mouth daily at 12 noon.    . magic mouthwash w/lidocaine SOLN Take 5 mLs by mouth 3 (three) times daily as needed for mouth pain. Swish in mouth then expectorate 120 mL 1  . Propylene Glycol 0.6 % SOLN Apply to eye.    . rosuvastatin (CRESTOR) 5 MG tablet Take 5 mg by mouth daily.    . theophylline (THEO-24) 300 MG 24 hr capsule Take 300 mg by mouth daily.     No current facility-administered medications for this visit.    Allergies  Allergen Reactions  . Hydromorphone Hcl       Review of Systems:  Constitutional:  No  fever, no chills, No recent illness, No unintentional weight changes. No significant fatigue.   HEENT: No  headache, no vision change, no hearing change, No sore throat, No  sinus pressure  Cardiac: No  chest pain, No  pressure, No palpitations, No  Orthopnea  Respiratory:  +shortness of breath. No  Cough  Gastrointestinal: No  abdominal pain, No  nausea, No  vomiting,  No  blood in stool, No  diarrhea, No  constipation   Musculoskeletal: No new myalgia/arthralgia  Genitourinary: No  incontinence, No  abnormal genital bleeding, No abnormal genital discharge  Skin: No  Rash, No other wounds/concerning lesions  Hem/Onc: No  easy bruising/bleeding, No  abnormal lymph node  Endocrine: No cold intolerance,  No heat intolerance. No polyuria/polydipsia/polyphagia   Neurologic: No  weakness, No  dizziness, No  slurred speech/focal weakness/facial droop  Psychiatric: No  concerns with depression, No  concerns with anxiety, No sleep problems, No mood problems  Exam:  BP (!) 152/67   Pulse 71   Ht 5' 8.5" (1.74 m)   Wt 151 lb (68.5 kg)   BMI 22.63 kg/m   Constitutional: VS see above.  General Appearance: alert, well-developed, well-nourished, NAD  Eyes: Normal lids and conjunctive, non-icteric sclera  Ears, Nose, Mouth, Throat: MMM, Normal external inspection ears/nares/mouth/lips/gums. Nasal mucosa pale with clear rhinorrhea.   Neck: No masses, trachea midline. No thyroid enlargement. No tenderness/mass appreciated. No lymphadenopathy  Respiratory: Normal respiratory effort. Diminished breath sounds and coarse breath sounds all lung fields bilaterally  Cardiovascular: S1/S2 normal, no rub/gallop auscultated. RRR. No lower extremity edema.   Neurological: Normal balance/coordination. No tremor. No cranial nerve deficit on limited exam. Motor and sensation intact and symmetric. Cerebellar reflexes intact.   Skin: warm, dry, intact. No rash/ulcer. No concerning nevi or subq nodules on limited exam.    Psychiatric: Normal judgment/insight. Normal mood and affect. Oriented x3.    ASSESSMENT/PLAN:   Steroid-dependent chronic obstructive pulmonary disease (HCC)  Essential hypertension - Plan: CBC with Differential/Platelet, COMPLETE METABOLIC PANEL WITH GFR, Lipid panel, DISCONTINUED: losartan (COZAAR) 25 MG tablet  Bronchiectasis without complication ([place]) - Following with pulmonology  Rhinorrhea - Looks like this was partially addressed by pulmonology, consider addition of antihistamine to Atrovent, follow-up with them  Daytime sleepiness - Certainly could be medication induced, await neurology records  Fatigue, unspecified type - Plan: CBC with Differential/Platelet, COMPLETE METABOLIC PANEL WITH GFR, VITAMIN D  25 Hydroxy (Vit-D Deficiency, Fractures)  Coronary artery disease involving native coronary artery of native heart without angina pectoris - We'll discuss further at next visit, uncertain if history of MI, patient is on statin but no other secondary prevention  Chronic vasomotor rhinitis  Diaphragmatic hernia without obstruction or gangrene - Okay to  continue PPI  Gastroesophageal reflux disease without esophagitis  Hyperlipidemia, unspecified hyperlipidemia type  Depression with anxiety - Consider de-escalation of medications, await neurology records  Leukocytosis, unspecified type - Found on labs, likely due to recent infection/steroids, plan to recheck and make sure going back to baseline - Plan: CBC with Differential/Platelet    Patient Instructions  Plan:  Will get neurology records and consider stopping Quetiapine  Take Ropinirole once daily (at bedtime) or as needed for leg shaking during the day  Would talk to your lung doctors about continuous use of oxygen  Try switch to Allegra and stop Claritin   Be using Atrovent nasal spray twice daily (from lung doctor)   Plan to get labs prior to next visit  Will try new blood pressure medication: Losartan    Visit summary with medication list and pertinent instructions was printed for patient to review. All questions at time of visit were answered - patient instructed to contact office with any additional concerns. ER/RTC precautions were reviewed with the patient. Follow-up plan: Return in about 4 weeks (around 01/11/2017) for [place].

## 2016-12-19 DIAGNOSIS — E559 Vitamin D deficiency, unspecified: Secondary | ICD-10-CM | POA: Diagnosis not present

## 2016-12-19 DIAGNOSIS — R5383 Other fatigue: Secondary | ICD-10-CM | POA: Diagnosis not present

## 2016-12-19 DIAGNOSIS — I10 Essential (primary) hypertension: Secondary | ICD-10-CM | POA: Diagnosis not present

## 2016-12-19 LAB — CBC WITH DIFFERENTIAL/PLATELET
BASOS PCT: 0 %
Basophils Absolute: 0 cells/uL (ref 0–200)
EOS ABS: 0 {cells}/uL — AB (ref 15–500)
Eosinophils Relative: 0 %
HCT: 48.8 % (ref 38.5–50.0)
HEMOGLOBIN: 16.1 g/dL (ref 13.2–17.1)
LYMPHS ABS: 1584 {cells}/uL (ref 850–3900)
Lymphocytes Relative: 9 %
MCH: 32.9 pg (ref 27.0–33.0)
MCHC: 33 g/dL (ref 32.0–36.0)
MCV: 99.6 fL (ref 80.0–100.0)
MONO ABS: 880 {cells}/uL (ref 200–950)
MPV: 11 fL (ref 7.5–12.5)
Monocytes Relative: 5 %
NEUTROS PCT: 86 %
Neutro Abs: 15136 cells/uL — ABNORMAL HIGH (ref 1500–7800)
Platelets: 270 10*3/uL (ref 140–400)
RBC: 4.9 MIL/uL (ref 4.20–5.80)
RDW: 13.2 % (ref 11.0–15.0)
WBC: 17.6 10*3/uL — AB (ref 3.8–10.8)

## 2016-12-19 LAB — LIPID PANEL
CHOLESTEROL: 197 mg/dL (ref <200)
HDL: 93 mg/dL (ref >40)
LDL Cholesterol: 81 mg/dL (ref <100)
Total CHOL/HDL Ratio: 2.1 Ratio (ref <5.0)
Triglycerides: 114 mg/dL (ref <150)
VLDL: 23 mg/dL (ref <30)

## 2016-12-19 LAB — COMPLETE METABOLIC PANEL WITH GFR
ALBUMIN: 3.7 g/dL (ref 3.6–5.1)
ALK PHOS: 101 U/L (ref 40–115)
ALT: 20 U/L (ref 9–46)
AST: 14 U/L (ref 10–35)
BILIRUBIN TOTAL: 0.8 mg/dL (ref 0.2–1.2)
BUN: 24 mg/dL (ref 7–25)
CALCIUM: 9.9 mg/dL (ref 8.6–10.3)
CO2: 31 mmol/L (ref 20–31)
CREATININE: 1.06 mg/dL (ref 0.70–1.11)
Chloride: 102 mmol/L (ref 98–110)
GFR, Est African American: 75 mL/min (ref >=60)
GFR, Est Non African American: 65 mL/min (ref >=60)
Glucose, Bld: 73 mg/dL (ref 65–99)
Potassium: 4.3 mmol/L (ref 3.5–5.3)
Sodium: 140 mmol/L (ref 135–146)
Total Protein: 6.4 g/dL (ref 6.1–8.1)

## 2016-12-20 LAB — VITAMIN D 25 HYDROXY (VIT D DEFICIENCY, FRACTURES): Vit D, 25-Hydroxy: 41 ng/mL (ref 30–100)

## 2016-12-21 NOTE — Addendum Note (Signed)
Addended by: Maryla Morrow on: 12/21/2016 09:52 AM   Modules accepted: Orders

## 2017-01-08 DIAGNOSIS — N200 Calculus of kidney: Secondary | ICD-10-CM | POA: Diagnosis not present

## 2017-01-08 DIAGNOSIS — Z87442 Personal history of urinary calculi: Secondary | ICD-10-CM | POA: Diagnosis not present

## 2017-01-24 DIAGNOSIS — M79605 Pain in left leg: Secondary | ICD-10-CM | POA: Diagnosis not present

## 2017-01-24 DIAGNOSIS — J479 Bronchiectasis, uncomplicated: Secondary | ICD-10-CM | POA: Diagnosis not present

## 2017-01-24 DIAGNOSIS — R0789 Other chest pain: Secondary | ICD-10-CM | POA: Diagnosis not present

## 2017-01-24 DIAGNOSIS — M79662 Pain in left lower leg: Secondary | ICD-10-CM | POA: Diagnosis not present

## 2017-01-24 DIAGNOSIS — J441 Chronic obstructive pulmonary disease with (acute) exacerbation: Secondary | ICD-10-CM | POA: Diagnosis not present

## 2017-02-07 ENCOUNTER — Other Ambulatory Visit: Payer: Self-pay | Admitting: Osteopathic Medicine

## 2017-02-07 MED ORDER — LANSOPRAZOLE 30 MG PO CPDR: 30.0000 mg | DELAYED_RELEASE_CAPSULE | Freq: Every day | ORAL | 3 refills | Status: DC

## 2017-02-11 DIAGNOSIS — J479 Bronchiectasis, uncomplicated: Secondary | ICD-10-CM | POA: Diagnosis not present

## 2017-02-11 DIAGNOSIS — J9601 Acute respiratory failure with hypoxia: Secondary | ICD-10-CM | POA: Diagnosis not present

## 2017-02-11 DIAGNOSIS — J449 Chronic obstructive pulmonary disease, unspecified: Secondary | ICD-10-CM | POA: Diagnosis not present

## 2017-02-11 DIAGNOSIS — J471 Bronchiectasis with (acute) exacerbation: Secondary | ICD-10-CM | POA: Diagnosis not present

## 2017-03-08 ENCOUNTER — Encounter: Payer: Self-pay | Admitting: Osteopathic Medicine

## 2017-03-08 ENCOUNTER — Ambulatory Visit (INDEPENDENT_AMBULATORY_CARE_PROVIDER_SITE_OTHER): Payer: Medicare Other | Admitting: Osteopathic Medicine

## 2017-03-08 VITALS — BP 169/80 | HR 96 | Ht 71.0 in | Wt 145.0 lb

## 2017-03-08 DIAGNOSIS — Z1321 Encounter for screening for nutritional disorder: Secondary | ICD-10-CM | POA: Diagnosis not present

## 2017-03-08 DIAGNOSIS — I2510 Atherosclerotic heart disease of native coronary artery without angina pectoris: Secondary | ICD-10-CM | POA: Diagnosis not present

## 2017-03-08 DIAGNOSIS — F0391 Unspecified dementia with behavioral disturbance: Secondary | ICD-10-CM | POA: Diagnosis not present

## 2017-03-08 DIAGNOSIS — R5383 Other fatigue: Secondary | ICD-10-CM | POA: Diagnosis not present

## 2017-03-08 DIAGNOSIS — Z87828 Personal history of other (healed) physical injury and trauma: Secondary | ICD-10-CM | POA: Diagnosis not present

## 2017-03-08 DIAGNOSIS — E559 Vitamin D deficiency, unspecified: Secondary | ICD-10-CM | POA: Diagnosis not present

## 2017-03-08 DIAGNOSIS — Z131 Encounter for screening for diabetes mellitus: Secondary | ICD-10-CM | POA: Diagnosis not present

## 2017-03-08 DIAGNOSIS — I10 Essential (primary) hypertension: Secondary | ICD-10-CM

## 2017-03-08 DIAGNOSIS — J449 Chronic obstructive pulmonary disease, unspecified: Secondary | ICD-10-CM | POA: Diagnosis not present

## 2017-03-08 LAB — CBC WITH DIFFERENTIAL/PLATELET
BASOS ABS: 0 {cells}/uL (ref 0–200)
Basophils Relative: 0 %
EOS ABS: 296 {cells}/uL (ref 15–500)
Eosinophils Relative: 2 %
HCT: 43.6 % (ref 38.5–50.0)
HEMOGLOBIN: 14.1 g/dL (ref 13.2–17.1)
Lymphocytes Relative: 9 %
Lymphs Abs: 1332 cells/uL (ref 850–3900)
MCH: 31 pg (ref 27.0–33.0)
MCHC: 32.3 g/dL (ref 32.0–36.0)
MCV: 95.8 fL (ref 80.0–100.0)
MONOS PCT: 6 %
MPV: 10.7 fL (ref 7.5–12.5)
Monocytes Absolute: 888 cells/uL (ref 200–950)
Neutro Abs: 12284 cells/uL — ABNORMAL HIGH (ref 1500–7800)
Neutrophils Relative %: 83 %
Platelets: 196 10*3/uL (ref 140–400)
RBC: 4.55 MIL/uL (ref 4.20–5.80)
RDW: 14.7 % (ref 11.0–15.0)
WBC: 14.8 10*3/uL — ABNORMAL HIGH (ref 3.8–10.8)

## 2017-03-08 LAB — T4, FREE: FREE T4: 1.1 ng/dL (ref 0.8–1.8)

## 2017-03-08 LAB — TSH: TSH: 1.62 m[IU]/L (ref 0.40–4.50)

## 2017-03-08 MED ORDER — ROSUVASTATIN CALCIUM 5 MG PO TABS: 5.0000 mg | ORAL_TABLET | Freq: Every day | ORAL | 3 refills | Status: DC

## 2017-03-08 NOTE — Patient Instructions (Addendum)
Plan:  STOP Seroquel (quetiapine)  REFILL Crestor (rosuvastatin) 5 mg   Labs today to see if we can find a good reason for feeling so tired   Bring home blood pressure monitor to your next visit   See me again in 1 week to recheck symptoms, see how you're doing off the Seroquel, and recheck blood pressure

## 2017-03-08 NOTE — Progress Notes (Signed)
HPI: Bill Hinton is a 83 y.o. male  who presents to [facility] today, 03/08/17,  for chief complaint of:  Chief Complaint  Patient presents with  . Other    POSSIBLE BELLS PALSY    Patient has history of Bell's palsy a few years ago which resolved spontaneously. He is currently experiencing some vague symptoms of generalized weakness, chronic left lower extremity pain/tremor associated with restless leg syndrome, worsening fatigue. He is not experiencing any facial droop. Vision is typically fairly poor, vision screening today per nurse notes. He and his family are wondering if all this could be due to a recurrence of Bell's palsy.  Other issues: Patient slid off of bed few days ago and tore a good deal of skin off of right arm. This is well bandaged and clean at this point, family has some concerns about some mild swelling/ecchymosis distal to his injury. It does not seem to bother the patient.  History of dementia: This is a cause of a lot of stress for his wife. He is previously seen neurology for this as well as restless leg syndrome. On several psychotropic medications as below including Seroquel 3 times daily, Aricept, Namenda, citalopram, Requip at night.  Blood pressure elevated: Patient states was normal at other doctor visit recently, home blood pressure cuff not checked in some time.  Past medical history, surgical history, social history and family history reviewed.  Patient Active Problem List   Diagnosis Date Noted  . Benign prostatic hyperplasia 12/14/2016  . CAD (coronary artery disease), native coronary artery 12/14/2016  . Depression with anxiety 12/14/2016  . History of nephrolithiasis 12/14/2016  . Pneumothorax 12/14/2016  . Tinnitus 12/14/2016  . Vitamin D deficiency 12/14/2016  . Rhinorrhea 12/14/2016  . Declining functional status 09/26/2016  . Pneumonia of both lungs due to infectious organism 09/19/2016  . Restless leg  syndrome 09/19/2016  . Acute respiratory failure with hypoxia ([place]) 09/17/2016  . Bell's palsy 09/17/2016  . Bronchiectasis ([place]) 09/17/2016  . 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 Dysphagia 08/17/2016  . Vasomotor rhinitis 08/17/2016  . Right ureteral calculus 04/13/2015  . Atherosclerotic heart disease of native coronary artery without angina pectoris 04/11/2015  . Chronic kidney disease 04/11/2015  . Diaphragmatic hernia without obstruction or gangrene 04/11/2015  . Enlarged prostate without lower urinary tract symptoms (luts) 04/11/2015  . Essential (primary) hypertension 04/11/2015  . Nocturia 04/11/2015  . Other seasonal allergic rhinitis 04/11/2015  . Panic disorder without agoraphobia 04/11/2015  . Status post cataract extraction 01/21/2014  . Presence of intraocular lens 01/21/2014  . Pseudophakia of both eyes 01/21/2014  . Nuclear sclerosis of right eye 01/19/2014  . 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 Pterygium of left eye 05/29/2012  . CELLULITIS, [place] 01/01/2009  . CELLULITIS, HAND, RIGHT 01/01/2009  . HLD (hyperlipidemia) 12/30/2008  . 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 Gastroesophageal reflux disease without esophagitis 12/30/2008    Current medication list and allergy/intolerance information reviewed.   Current Outpatient Prescriptions on File Prior to Visit  Medication Sig Dispense Refill  . citalopram (CELEXA) 40 MG tablet Take 40 mg by mouth daily.    Marland Kitchen donepezil (ARICEPT) 10 MG tablet Take 10 mg by mouth daily.    Marland Kitchen esomeprazole (NEXIUM) 40 MG capsule Take 40 mg by mouth daily at 12 noon.    Marland Kitchen ipratropium-albuterol (DUONEB) 0.5-2.5 (3) MG/3ML SOLN Inhale into the lungs.    Marland Kitchen  lansoprazole (PREVACID) 30 MG capsule Take 1 capsule (30 mg total) by mouth daily. 90 capsule 3  . memantine (NAMENDA) 10 MG tablet Take 10 mg by mouth daily.  5   . montelukast (SINGULAIR) 10 MG tablet Take 10 mg by mouth daily.  11  . predniSONE (DELTASONE) 10 MG tablet 15 mg daily    . Propylene Glycol 0.6 % SOLN Apply to eye.    Marland Kitchen QUEtiapine (SEROQUEL) 50 MG tablet Take 50 mg by mouth 3 (three) times daily.  6  . rOPINIRole (REQUIP) 0.5 MG tablet Take 0.5 mg by mouth daily.  11   No current facility-administered medications on file prior to visit.    Allergies  Allergen Reactions  . Hydromorphone Hcl       Review of Systems: assistance of family appreciated but overall vague symptoms and poor historian, ROS and HPI limited  Constitutional: No recent illness, +genrealized weakness  HEENT: No  headache, no vision change  Cardiac: No  chest pain, No  pressure, No palpitations  Respiratory:  No  shortness of breath. No  Cough  Gastrointestinal: No  abdominal pain  Musculoskeletal: +new myalgia/arthralgia  Skin: No  Rash, skin injury on R arm as per HPI  Hem/Onc: No  easy bruising/bleeding  Neurologic: +genrealized but no focal weakness, No  Dizziness  Psychiatric: dementia  Exam:  BP (!) 169/80   Pulse 96   Ht '5\' 11"'  (1.803 m)   Wt 145 lb (65.8 kg)   BMI 20.22 kg/m   Constitutional: VS see above. General Appearance: alert, well-developed, well-nourished, NAD  Eyes: Normal lids and conjunctive, non-icteric sclera  Ears, Nose, Mouth, Throat: MMM, Normal external inspection ears/nares/mouth/lips/gums.  Neck: No masses, trachea midline.   Respiratory: Normal respiratory effort. no wheeze, no rhonchi, no rales  Cardiovascular: S1/S2 normal, no murmur, no rub/gallop auscultated. RRR.   Musculoskeletal: Gait normal. Symmetric and independent movement of all extremities  Neurological: Normal balance/coordination. Diminished vision bilaterally. Grip strength equal, strength lower extremities 5 out of 5, upper extremities 5 out of 5, cranial nerves intact on limited exam, in particular symmetric eyebrow raise, tightly shunting  eyes, Gering teeth, no deviation of uvula or tongue, no ptosis. Nasolabial folds present b/l  Skin: warm, dry. Broad superficial laceration to right forearm, epidermal layer essentially peeled off with beginnings of granulation tissue under bandage, no erythema/drainage.  Psychiatric: Poor judgment/insight. At times normal but at times a bit agitated mood and affect.     Recent Results (from the past 2160 hour(s))  CBC with Differential/Platelet     Status: Abnormal   Collection Time: 12/19/16  8:08 AM  Result Value Ref Range   WBC 17.6 (H) 3.8 - 10.8 K/uL   RBC 4.90 4.20 - 5.80 MIL/uL   Hemoglobin 16.1 13.2 - 17.1 g/dL   HCT 48.8 38.5 - 50.0 %   MCV 99.6 80.0 - 100.0 fL   MCH 32.9 27.0 - 33.0 pg   MCHC 33.0 32.0 - 36.0 g/dL   RDW 13.2 11.0 - 15.0 %   Platelets 270 140 - 400 K/uL   MPV 11.0 7.5 - 12.5 fL   Neutro Abs 15,136 (H) 1,500 - 7,800 cells/uL   Lymphs Abs 1,584 850 - 3,900 cells/uL   Monocytes Absolute 880 200 - 950 cells/uL   Eosinophils Absolute 0 (L) 15 - 500 cells/uL   Basophils Absolute 0 0 - 200 cells/uL   Neutrophils Relative % 86 %   Lymphocytes Relative 9 %   Monocytes Relative  5 %   Eosinophils Relative 0 %   Basophils Relative 0 %   Smear Review Criteria for review not met   COMPLETE METABOLIC PANEL WITH GFR     Status: None   Collection Time: 12/19/16  8:08 AM  Result Value Ref Range   Sodium 140 135 - 146 mmol/L   Potassium 4.3 3.5 - 5.3 mmol/L   Chloride 102 98 - 110 mmol/L   CO2 31 20 - 31 mmol/L   Glucose, Bld 73 65 - 99 mg/dL   BUN 24 7 - 25 mg/dL   Creat 1.06 0.70 - 1.11 mg/dL    Comment:   For patients > or = 81 years of age: The upper reference limit for Creatinine is approximately 13% higher for people identified as African-American.      Total Bilirubin 0.8 0.2 - 1.2 mg/dL   Alkaline Phosphatase 101 40 - 115 U/L   AST 14 10 - 35 U/L   ALT 20 9 - 46 U/L   Total Protein 6.4 6.1 - 8.1 g/dL   Albumin 3.7 3.6 - 5.1 g/dL   Calcium 9.9 8.6  - 10.3 mg/dL   GFR, Est African American 75 >=60 mL/min   GFR, Est Non African American 65 >=60 mL/min  Lipid panel     Status: None   Collection Time: 12/19/16  8:08 AM  Result Value Ref Range   Cholesterol 197 <200 mg/dL   Triglycerides 114 <150 mg/dL   HDL 93 >40 mg/dL   Total CHOL/HDL Ratio 2.1 <5.0 Ratio   VLDL 23 <30 mg/dL   LDL Cholesterol 81 <100 mg/dL  VITAMIN D 25 Hydroxy (Vit-D Deficiency, Fractures)     Status: None   Collection Time: 12/19/16  8:08 AM  Result Value Ref Range   Vit D, 25-Hydroxy 41 30 - 100 ng/mL    Comment: Vitamin D Status           25-OH Vitamin D        Deficiency                <20 ng/mL        Insufficiency         20 - 29 ng/mL        Optimal             > or = 30 ng/mL   For 25-OH Vitamin D testing on patients on D2-supplementation and patients for whom quantitation of D2 and D3 fractions is required, the QuestAssureD 25-OH VIT D, (D2,D3), LC/MS/MS is recommended: order code [id number] (patients > 2 yrs).      ASSESSMENT/PLAN:   Fatigue, unspecified type - No symptoms of Bell's palsy or stroke, consider alternative to RLS, may need follow-up with neuro to determine parkinsonism or other cause tremor - Explained unlikely Bell's palsy given fairly normal physical exam neurologically and also the would not effect vision or legs. Advised follow-up with ophthalmologic regarding poor vision, patient is not wearing corrective lenses at this time - Plan: CBC with Differential/Platelet, COMPLETE METABOLIC PANEL WITH GFR, TSH, T4, free, Hemoglobin A1c, VITAMIN D 25 Hydroxy (Vit-D Deficiency, Fractures), Urine Microscopic  Essential hypertension - Bring home blood pressure monitor to next visit  Steroid-dependent chronic obstructive pulmonary disease ([place]) - Following with pulmonology, no increased shortness of breath or decreased oxygen  Coronary artery disease involving native coronary artery of native heart without angina pectoris - I don't see that he is  on secondary prevention measures other than statin,  consider ACE inhibitor, beta blocker, aspirin - await labs and will discuss at next v  Dementia with behavioral disturbance, unspecified dementia type - Suspect this is why on Seroquel, may be causing some sedation/fatigue, will discontinue for now, follow-up in one week after labs  H/O laceration of skin - Wound does not appear infected, wound care and infection precautions reviewed with family    Patient Instructions  Plan:  STOP Seroquel (quetiapine)  REFILL Crestor (rosuvastatin) 5 mg   Labs today to see if we can find a good reason for feeling so tired   Bring home blood pressure monitor to your next visit   See me again in 1 week to recheck symptoms, see how you're doing off the Seroquel, and recheck blood pressure    Follow-up plan: Return in about 1 week (around 03/15/2017) for recheck fatigue and blood pressure, sooner if needed.  Visit summary with medication list and pertinent instructions was printed for patient to review, alert [place] if any changes needed. All questions at time of visit were answered - patient instructed to contact office with any additional concerns. ER/RTC precautions were reviewed with the patient and understanding verbalized.   Note: Total time spent 40 minutes, greater than 50% of the visit was spent face-to-face counseling and coordinating care for the following: The primary encounter diagnosis was Fatigue, unspecified type. Diagnoses of Essential hypertension, Steroid-dependent chronic obstructive pulmonary disease ([place]), Coronary artery disease involving native coronary artery of native heart without angina pectoris, Dementia with behavioral disturbance, unspecified dementia type, and H/O laceration of skin were also pertinent to this visit.Marland Kitchen

## 2017-03-09 LAB — COMPLETE METABOLIC PANEL WITH GFR
ALBUMIN: 2.8 g/dL — AB (ref 3.6–5.1)
ALK PHOS: 101 U/L (ref 40–115)
ALT: 20 U/L (ref 9–46)
AST: 13 U/L (ref 10–35)
BILIRUBIN TOTAL: 0.9 mg/dL (ref 0.2–1.2)
BUN: 11 mg/dL (ref 7–25)
CO2: 28 mmol/L (ref 20–31)
Calcium: 9 mg/dL (ref 8.6–10.3)
Chloride: 103 mmol/L (ref 98–110)
Creat: 0.91 mg/dL (ref 0.70–1.11)
GFR, Est Non African American: 78 mL/min (ref >=60)
Glucose, Bld: 89 mg/dL (ref 65–99)
Potassium: 3.6 mmol/L (ref 3.5–5.3)
Sodium: 142 mmol/L (ref 135–146)
TOTAL PROTEIN: 5.2 g/dL — AB (ref 6.1–8.1)

## 2017-03-09 LAB — URINALYSIS, MICROSCOPIC ONLY
Bacteria, UA: NONE SEEN [HPF]
CASTS: NONE SEEN [LPF]
Crystals: NONE SEEN [HPF]
Squamous Epithelial / LPF: NONE SEEN [HPF] (ref <=5)
Yeast: NONE SEEN [HPF]

## 2017-03-09 LAB — VITAMIN D 25 HYDROXY (VIT D DEFICIENCY, FRACTURES): Vit D, 25-Hydroxy: 33 ng/mL (ref 30–100)

## 2017-03-09 LAB — HEMOGLOBIN A1C
Hgb A1c MFr Bld: 6.5 % — ABNORMAL HIGH (ref <5.7)
Mean Plasma Glucose: 140 mg/dL

## 2017-03-13 DIAGNOSIS — D72829 Elevated white blood cell count, unspecified: Secondary | ICD-10-CM | POA: Diagnosis not present

## 2017-03-13 LAB — CBC WITH DIFFERENTIAL/PLATELET
BASOS ABS: 0 {cells}/uL (ref 0–200)
Basophils Relative: 0 %
EOS PCT: 2 %
Eosinophils Absolute: 274 cells/uL (ref 15–500)
HCT: 42 % (ref 38.5–50.0)
Hemoglobin: 13.7 g/dL (ref 13.2–17.1)
LYMPHS PCT: 13 %
Lymphs Abs: 1781 cells/uL (ref 850–3900)
MCH: 31.5 pg (ref 27.0–33.0)
MCHC: 32.6 g/dL (ref 32.0–36.0)
MCV: 96.6 fL (ref 80.0–100.0)
MPV: 10.1 fL (ref 7.5–12.5)
Monocytes Absolute: 685 cells/uL (ref 200–950)
Monocytes Relative: 5 %
NEUTROS ABS: 10960 {cells}/uL — AB (ref 1500–7800)
Neutrophils Relative %: 80 %
PLATELETS: 204 10*3/uL (ref 140–400)
RBC: 4.35 MIL/uL (ref 4.20–5.80)
RDW: 14.8 % (ref 11.0–15.0)
WBC: 13.7 10*3/uL — ABNORMAL HIGH (ref 3.8–10.8)

## 2017-03-13 NOTE — Addendum Note (Signed)
Addended by: Bo Mcclintock C on: 03/13/2017 11:10 AM   Modules accepted: Orders

## 2017-03-14 ENCOUNTER — Other Ambulatory Visit: Payer: Self-pay

## 2017-03-14 ENCOUNTER — Other Ambulatory Visit: Payer: Self-pay | Admitting: Osteopathic Medicine

## 2017-03-14 DIAGNOSIS — D72829 Elevated white blood cell count, unspecified: Secondary | ICD-10-CM

## 2017-03-14 NOTE — Progress Notes (Signed)
Add-on labs

## 2017-03-15 ENCOUNTER — Ambulatory Visit (INDEPENDENT_AMBULATORY_CARE_PROVIDER_SITE_OTHER): Payer: Medicare Other

## 2017-03-15 ENCOUNTER — Encounter: Payer: Self-pay | Admitting: Osteopathic Medicine

## 2017-03-15 ENCOUNTER — Ambulatory Visit (INDEPENDENT_AMBULATORY_CARE_PROVIDER_SITE_OTHER): Payer: Medicare Other | Admitting: Osteopathic Medicine

## 2017-03-15 VITALS — BP 124/81 | HR 65 | Ht 71.0 in | Wt 143.0 lb

## 2017-03-15 DIAGNOSIS — J849 Interstitial pulmonary disease, unspecified: Secondary | ICD-10-CM

## 2017-03-15 DIAGNOSIS — J441 Chronic obstructive pulmonary disease with (acute) exacerbation: Secondary | ICD-10-CM

## 2017-03-15 DIAGNOSIS — G2581 Restless legs syndrome: Secondary | ICD-10-CM | POA: Diagnosis not present

## 2017-03-15 DIAGNOSIS — R05 Cough: Secondary | ICD-10-CM | POA: Diagnosis not present

## 2017-03-15 DIAGNOSIS — J449 Chronic obstructive pulmonary disease, unspecified: Secondary | ICD-10-CM | POA: Diagnosis not present

## 2017-03-15 DIAGNOSIS — J181 Lobar pneumonia, unspecified organism: Secondary | ICD-10-CM

## 2017-03-15 DIAGNOSIS — Z7952 Long term (current) use of systemic steroids: Secondary | ICD-10-CM

## 2017-03-15 DIAGNOSIS — I10 Essential (primary) hypertension: Secondary | ICD-10-CM

## 2017-03-15 DIAGNOSIS — I2510 Atherosclerotic heart disease of native coronary artery without angina pectoris: Secondary | ICD-10-CM | POA: Diagnosis not present

## 2017-03-15 DIAGNOSIS — J189 Pneumonia, unspecified organism: Secondary | ICD-10-CM

## 2017-03-15 LAB — PATHOLOGIST SMEAR REVIEW

## 2017-03-15 MED ORDER — AZITHROMYCIN 250 MG PO TABS: ORAL_TABLET | ORAL | 0 refills | Status: DC

## 2017-03-15 MED ORDER — LEVOFLOXACIN 750 MG PO TABS: 750.0000 mg | ORAL_TABLET | Freq: Every day | ORAL | 0 refills | Status: DC

## 2017-03-15 MED ORDER — PREDNISONE 50 MG PO TABS: 50.0000 mg | ORAL_TABLET | Freq: Every day | ORAL | 0 refills | Status: DC

## 2017-03-15 NOTE — Progress Notes (Signed)
HPI: Bill Hinton is a 83 y.o. male  who presents to [place] today, 03/15/17,  for chief complaint of:  Chief Complaint  Patient presents with  . Follow-up    blood pressure   Fatigue: We discontinued Seroquel (probably on this d/t dementia w/ hx behavioral disturbance) to see if this might relieve some sedating effects, today pt is still fatigued/tired. Wife and daughter are noticing more cough.   WBC: My mistake for making a big deal out of this as I neglected to account for chronic steroid use. WBC are a little higher than in the past on record review, though.   RLS: worse since stopping Seroquel     Past medical history, surgical history, social history and family history reviewed.  Patient Active Problem List   Diagnosis Date Noted  . Benign prostatic hyperplasia 12/14/2016  . CAD (coronary artery disease), native coronary artery 12/14/2016  . Depression with anxiety 12/14/2016  . History of nephrolithiasis 12/14/2016  . Pneumothorax 12/14/2016  . Tinnitus 12/14/2016  . Vitamin D deficiency 12/14/2016  . Rhinorrhea 12/14/2016  . Declining functional status 09/26/2016  . Pneumonia of both lungs due to infectious organism 09/19/2016  . Restless leg syndrome 09/19/2016  . Acute respiratory failure with hypoxia ([place]) 09/17/2016  . Bell's palsy 09/17/2016  . Bronchiectasis ([place]) 09/17/2016  . 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 Dysphagia 08/17/2016  . Vasomotor rhinitis 08/17/2016  . Right ureteral calculus 04/13/2015  . Atherosclerotic heart disease of native coronary artery without angina pectoris 04/11/2015  . Chronic kidney disease 04/11/2015  . Diaphragmatic hernia without obstruction or gangrene 04/11/2015  . Enlarged prostate without lower urinary tract symptoms (luts) 04/11/2015  . Essential (primary) hypertension 04/11/2015  . Nocturia 04/11/2015  . Other seasonal allergic rhinitis  04/11/2015  . Panic disorder without agoraphobia 04/11/2015  . Status post cataract extraction 01/21/2014  . Presence of intraocular lens 01/21/2014  . Pseudophakia of both eyes 01/21/2014  . Nuclear sclerosis of right eye 01/19/2014  . 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 Pterygium of left eye 05/29/2012  . HLD (hyperlipidemia) 12/30/2008  . Steroid-dependent chronic obstructive pulmonary disease ([place]) 12/30/2008  . Gastroesophageal reflux disease without esophagitis 12/30/2008    Current medication list and allergy/intolerance information reviewed.   Current Outpatient Prescriptions on File Prior to Visit  Medication Sig Dispense Refill  . citalopram (CELEXA) 40 MG tablet Take 40 mg by mouth daily.    Marland Kitchen donepezil (ARICEPT) 10 MG tablet Take 10 mg by mouth daily.    Marland Kitchen esomeprazole (NEXIUM) 40 MG capsule Take 40 mg by mouth daily at 12 noon.    Marland Kitchen ipratropium-albuterol (DUONEB) 0.5-2.5 (3) MG/3ML SOLN Inhale into the lungs.    . lansoprazole (PREVACID) 30 MG capsule Take 1 capsule (30 mg total) by mouth daily. 90 capsule 3  . memantine (NAMENDA) 10 MG tablet Take 10 mg by mouth daily.  5  . montelukast (SINGULAIR) 10 MG tablet Take 10 mg by mouth daily.  11  . predniSONE (DELTASONE) 10 MG tablet 15 mg daily    . Propylene Glycol 0.6 % SOLN Apply to eye.    Marland Kitchen QUEtiapine (SEROQUEL) 50 MG tablet Take 50 mg by mouth 3 (three) times daily.  6  . rOPINIRole (REQUIP) 0.5 MG tablet Take 0.5 mg by mouth daily.  11  . rosuvastatin (CRESTOR) 5 MG tablet Take 1 tablet (5 mg total) by mouth at bedtime. [place]  tablet 3   No current facility-administered medications on file prior to visit.    Allergies  Allergen Reactions  . Hydromorphone Hcl       Review of Systems:  Constitutional: No recent illness, +fatigue  HEENT: No  headache, no vision change  Cardiac: No  chest pain, No  pressure, No palpitations  Respiratory:  No  shortness of  breath. +chronic Cough  Gastrointestinal: No  abdominal pain, no change on bowel habits  Musculoskeletal: No new myalgia/arthralgia  Skin: No  Rash  Exam:  BP 124/81 (BP Location: Left Wrist, Patient Position: Sitting, Cuff Size: Normal)   Pulse 65   Ht '5\' 11"'  (1.803 m)   Wt 143 lb (64.9 kg)   BMI 19.94 kg/m   Constitutional: VS see above. General Appearance: alert, well-developed, well-nourished, NAD  Eyes: Normal lids and conjunctive, non-icteric sclera  Ears, Nose, Mouth, Throat: MMM, Normal external inspection ears/nares/mouth/lips/gums.  Neck: No masses, trachea midline.   Respiratory: Normal respiratory effort. Diminished breath sounds bilaterally   Cardiovascular: S1/S2 normal, no murmur, no rub/gallop auscultated. RRR.   Neurological: +legs twitching a bit - pt very restless  Skin: warm, dry, intact.   Psychiatric: Poor judgment/insight. Flat mood and affect.   Recent Results (from the past 2160 hour(s))  CBC with Differential/Platelet     Status: Abnormal   Collection Time: 12/19/16  8:08 AM  Result Value Ref Range   WBC 17.6 (H) 3.8 - 10.8 K/uL   RBC 4.90 4.20 - 5.80 MIL/uL   Hemoglobin 16.1 13.2 - 17.1 g/dL   HCT 48.8 38.5 - 50.0 %   MCV 99.6 80.0 - 100.0 fL   MCH 32.9 27.0 - 33.0 pg   MCHC 33.0 32.0 - 36.0 g/dL   RDW 13.2 11.0 - 15.0 %   Platelets 270 140 - 400 K/uL   MPV 11.0 7.5 - 12.5 fL   Neutro Abs 15,136 (H) 1,500 - 7,800 cells/uL   Lymphs Abs 1,584 850 - 3,900 cells/uL   Monocytes Absolute 880 200 - 950 cells/uL   Eosinophils Absolute 0 (L) 15 - 500 cells/uL   Basophils Absolute 0 0 - 200 cells/uL   Neutrophils Relative % 86 %   Lymphocytes Relative 9 %   Monocytes Relative 5 %   Eosinophils Relative 0 %   Basophils Relative 0 %   Smear Review Criteria for review not met   COMPLETE METABOLIC PANEL WITH GFR     Status: None   Collection Time: 12/19/16  8:08 AM  Result Value Ref Range   Sodium 140 135 - 146 mmol/L   Potassium 4.3 3.5 -  5.3 mmol/L   Chloride 102 98 - 110 mmol/L   CO2 31 20 - 31 mmol/L   Glucose, Bld 73 65 - 99 mg/dL   BUN 24 7 - 25 mg/dL   Creat 1.06 0.70 - 1.11 mg/dL    Comment:   For patients > or = 81 years of age: The upper reference limit for Creatinine is approximately 13% higher for people identified as African-American.      Total Bilirubin 0.8 0.2 - 1.2 mg/dL   Alkaline Phosphatase 101 40 - 115 U/L   AST 14 10 - 35 U/L   ALT 20 9 - 46 U/L   Total Protein 6.4 6.1 - 8.1 g/dL   Albumin 3.7 3.6 - 5.1 g/dL   Calcium 9.9 8.6 - 10.3 mg/dL   GFR, Est African American 75 >=60 mL/min   GFR, Est Non  African American 65 >=60 mL/min  Lipid panel     Status: None   Collection Time: 12/19/16  8:08 AM  Result Value Ref Range   Cholesterol 197 <200 mg/dL   Triglycerides 114 <150 mg/dL   HDL 93 >40 mg/dL   Total CHOL/HDL Ratio 2.1 <5.0 Ratio   VLDL 23 <30 mg/dL   LDL Cholesterol 81 <100 mg/dL  VITAMIN D 25 Hydroxy (Vit-D Deficiency, Fractures)     Status: None   Collection Time: 12/19/16  8:08 AM  Result Value Ref Range   Vit D, 25-Hydroxy 41 30 - 100 ng/mL    Comment: Vitamin D Status           25-OH Vitamin D        Deficiency                <20 ng/mL        Insufficiency         20 - 29 ng/mL        Optimal             > or = 30 ng/mL   For 25-OH Vitamin D testing on patients on D2-supplementation and patients for whom quantitation of D2 and D3 fractions is required, the [facility] 25-OH VIT D, (D2,D3), LC/MS/MS is recommended: order code [id number] (patients > 2 yrs).   CBC with Differential/Platelet     Status: Abnormal   Collection Time: 03/08/17  9:24 AM  Result Value Ref Range   WBC 14.8 (H) 3.8 - 10.8 K/uL   RBC 4.55 4.20 - 5.80 MIL/uL   Hemoglobin 14.1 13.2 - 17.1 g/dL   HCT 43.6 38.5 - 50.0 %   MCV 95.8 80.0 - 100.0 fL   MCH 31.0 27.0 - 33.0 pg   MCHC 32.3 32.0 - 36.0 g/dL   RDW 14.7 11.0 - 15.0 %   Platelets 196 140 - 400 K/uL   MPV 10.7 7.5 - 12.5 fL   Neutro Abs 12,284 (H)  1,500 - 7,800 cells/uL   Lymphs Abs 1,332 850 - 3,900 cells/uL   Monocytes Absolute 888 200 - 950 cells/uL   Eosinophils Absolute 296 15 - 500 cells/uL   Basophils Absolute 0 0 - 200 cells/uL   Neutrophils Relative % 83 %   Lymphocytes Relative 9 %   Monocytes Relative 6 %   Eosinophils Relative 2 %   Basophils Relative 0 %   Smear Review Criteria for review not met   COMPLETE METABOLIC PANEL WITH GFR     Status: Abnormal   Collection Time: 03/08/17  9:24 AM  Result Value Ref Range   Sodium 142 135 - 146 mmol/L   Potassium 3.6 3.5 - 5.3 mmol/L   Chloride 103 98 - 110 mmol/L   CO2 28 20 - 31 mmol/L   Glucose, Bld 89 65 - 99 mg/dL   BUN 11 7 - 25 mg/dL   Creat 0.91 0.70 - 1.11 mg/dL    Comment:   For patients > or = 81 years of age: The upper reference limit for Creatinine is approximately 13% higher for people identified as African-American.      Total Bilirubin 0.9 0.2 - 1.2 mg/dL   Alkaline Phosphatase 101 40 - 115 U/L   AST 13 10 - 35 U/L   ALT 20 9 - 46 U/L   Total Protein 5.2 (L) 6.1 - 8.1 g/dL   Albumin 2.8 (L) 3.6 - 5.1 g/dL   Calcium 9.0 8.6 - 10.3  mg/dL   GFR, Est African American >89 >=60 mL/min   GFR, Est Non African American 78 >=60 mL/min  TSH     Status: None   Collection Time: 03/08/17  9:24 AM  Result Value Ref Range   TSH 1.62 0.40 - 4.50 mIU/L  T4, free     Status: None   Collection Time: 03/08/17  9:24 AM  Result Value Ref Range   Free T4 1.1 0.8 - 1.8 ng/dL  Hemoglobin A1c     Status: Abnormal   Collection Time: 03/08/17  9:24 AM  Result Value Ref Range   Hgb A1c MFr Bld 6.5 (H) <5.7 %    Comment:   For someone without known diabetes, a hemoglobin A1c value of 6.5% or greater indicates that they may have diabetes and this should be confirmed with a follow-up test.   For someone with known diabetes, a value <7% indicates that their diabetes is well controlled and a value greater than or equal to 7% indicates suboptimal control. A1c targets  should be individualized based on duration of diabetes, age, comorbid conditions, and other considerations.   Currently, no consensus exists for use of hemoglobin A1c for diagnosis of diabetes for children.      Mean Plasma Glucose 140 mg/dL  VITAMIN D 25 Hydroxy (Vit-D Deficiency, Fractures)     Status: None   Collection Time: 03/08/17  9:24 AM  Result Value Ref Range   Vit D, 25-Hydroxy 33 30 - 100 ng/mL    Comment: Vitamin D Status           25-OH Vitamin D        Deficiency                <20 ng/mL        Insufficiency         20 - 29 ng/mL        Optimal             > or = 30 ng/mL   For 25-OH Vitamin D testing on patients on D2-supplementation and patients for whom quantitation of D2 and D3 fractions is required, the [facility] 25-OH VIT D, (D2,D3), LC/MS/MS is recommended: order code [id number] (patients > 2 yrs).   Urine Microscopic     Status: None   Collection Time: 03/08/17  9:24 AM  Result Value Ref Range   WBC, UA 0-5 <=5 WBC/HPF   RBC / HPF 0-2 <=2 RBC/HPF   Squamous Epithelial / LPF NONE SEEN <=5 HPF   Bacteria, UA NONE SEEN NONE SEEN HPF   Crystals NONE SEEN NONE SEEN HPF   Casts NONE SEEN NONE SEEN LPF   Yeast NONE SEEN NONE SEEN HPF  CBC with Differential/Platelet     Status: Abnormal   Collection Time: 03/13/17 11:12 AM  Result Value Ref Range   WBC 13.7 (H) 3.8 - 10.8 K/uL   RBC 4.35 4.20 - 5.80 MIL/uL   Hemoglobin 13.7 13.2 - 17.1 g/dL   HCT 42.0 38.5 - 50.0 %   MCV 96.6 80.0 - 100.0 fL   MCH 31.5 27.0 - 33.0 pg   MCHC 32.6 32.0 - 36.0 g/dL   RDW 14.8 11.0 - 15.0 %   Platelets 204 140 - 400 K/uL   MPV 10.1 7.5 - 12.5 fL   Neutro Abs 10,960 (H) 1,500 - 7,800 cells/uL   Lymphs Abs 1,781 850 - 3,900 cells/uL   Monocytes Absolute 685 200 - 950 cells/uL   Eosinophils  Absolute 274 15 - 500 cells/uL   Basophils Absolute 0 0 - 200 cells/uL   Neutrophils Relative % 80 %   Lymphocytes Relative 13 %   Monocytes Relative 5 %   Eosinophils Relative 2 %    Basophils Relative 0 %   Smear Review Criteria for review not met     Dg Chest 2 View  Result Date: 03/15/2017 CLINICAL DATA:  Productive cough. EXAM: CHEST  2 VIEW COMPARISON:  03/12/2016.  02/23/2016. FINDINGS: New right upper lobe infiltrate noted. Persistent chronic interstitial changes noted. Persistent biapical pleuroparenchymal thickening consistent scarring. Stable left base pleural thickening consistent scarring . No pneumothorax. Heart size normal. No acute bony abnormality. IMPRESSION: 1.  New right upper lobe infiltrate consistent with pneumonia. 2. Chronic interstitial lung disease and changes of pleural-parenchymal scarring. Electronically Signed   By: Marcello Moores  Register   On: 03/15/2017 12:49    ASSESSMENT/PLAN:   Community acquired pneumonia of right upper lobe of lung ([place]) - Called and spoke to patient's daughter over the phone to alert her we are changing from [place] to Levaquin due to concern for pneumonia on chest x-ray - Plan: levofloxacin (LEVAQUIN) 750 MG tablet  Current chronic use of systemic steroids  Steroid-dependent chronic obstructive pulmonary disease ([place]) - Plan: DG Chest 2 View  Essential (primary) hypertension - Hold off on blood pressure medication adjustment now, our levels in the office are normal  Coronary artery disease involving native coronary artery of native heart without angina pectoris - Discussed adding medications for secondary prevention, family is not interested in any additional medications at this time unless absolutely necessary,  COPD exacerbation ([place]) - Plan: predniSONE (DELTASONE) 50 MG tablet, DISCONTINUED: azithromycin (ZITHROMAX) 250 MG tablet  Restless leg syndrome - We discuss this as well as dementia, possible parkinsonism tendencies with neurologist when they see this person next month      Follow-up plan: Return in about 3 months (around 06/15/2017) for recheck sugars.  Visit summary with medication list and pertinent  instructions was printed for patient to review, alert [place] if any changes needed. All questions at time of visit were answered - patient instructed to contact office with any additional concerns. ER/RTC precautions were reviewed with the patient and understanding verbalized.   Note: Total time spent 25 minutes, greater than 50% of the visit was spent face-to-face counseling and coordinating care for the following: The primary encounter diagnosis was Community acquired pneumonia of right upper lobe of lung ([place]). Diagnoses of Current chronic use of systemic steroids, Steroid-dependent chronic obstructive pulmonary disease ([facility]), Essential (primary) hypertension, Coronary artery disease involving native coronary artery of native heart without angina pectoris, and COPD exacerbation ([place]) were also pertinent to this visit..  Established: 46659- 15 / [id number]- 25 / [place]- 40 New: 93570- 63 / [id number]- 50 / [id number]- 46

## 2017-03-26 DIAGNOSIS — I519 Heart disease, unspecified: Secondary | ICD-10-CM | POA: Diagnosis not present

## 2017-03-26 DIAGNOSIS — I083 Combined rheumatic disorders of mitral, aortic and tricuspid valves: Secondary | ICD-10-CM | POA: Diagnosis not present

## 2017-03-26 DIAGNOSIS — I5189 Other ill-defined heart diseases: Secondary | ICD-10-CM | POA: Diagnosis not present

## 2017-04-04 DIAGNOSIS — G301 Alzheimer's disease with late onset: Secondary | ICD-10-CM | POA: Diagnosis not present

## 2017-04-04 DIAGNOSIS — R451 Restlessness and agitation: Secondary | ICD-10-CM | POA: Diagnosis not present

## 2017-04-04 DIAGNOSIS — R270 Ataxia, unspecified: Secondary | ICD-10-CM | POA: Diagnosis not present

## 2017-04-04 DIAGNOSIS — G603 Idiopathic progressive neuropathy: Secondary | ICD-10-CM | POA: Diagnosis not present

## 2017-04-08 ENCOUNTER — Encounter: Payer: Self-pay | Admitting: Osteopathic Medicine

## 2017-04-08 ENCOUNTER — Ambulatory Visit (HOSPITAL_BASED_OUTPATIENT_CLINIC_OR_DEPARTMENT_OTHER)
Admission: RE | Admit: 2017-04-08 | Discharge: 2017-04-08 | Disposition: A | Discharge status: Home / Self Care | Payer: Medicare Other | Source: Ambulatory Visit | Attending: Osteopathic Medicine | Admitting: Osteopathic Medicine

## 2017-04-08 ENCOUNTER — Ambulatory Visit (INDEPENDENT_AMBULATORY_CARE_PROVIDER_SITE_OTHER): Payer: Medicare Other | Admitting: Osteopathic Medicine

## 2017-04-08 VITALS — BP 155/70 | HR 93 | Wt 152.0 lb

## 2017-04-08 DIAGNOSIS — J440 Chronic obstructive pulmonary disease with acute lower respiratory infection: Secondary | ICD-10-CM | POA: Diagnosis not present

## 2017-04-08 DIAGNOSIS — J929 Pleural plaque without asbestos: Secondary | ICD-10-CM | POA: Diagnosis not present

## 2017-04-08 DIAGNOSIS — R05 Cough: Secondary | ICD-10-CM | POA: Diagnosis not present

## 2017-04-08 DIAGNOSIS — I10 Essential (primary) hypertension: Secondary | ICD-10-CM

## 2017-04-08 DIAGNOSIS — Z87891 Personal history of nicotine dependence: Secondary | ICD-10-CM | POA: Diagnosis not present

## 2017-04-08 DIAGNOSIS — R911 Solitary pulmonary nodule: Secondary | ICD-10-CM | POA: Diagnosis not present

## 2017-04-08 DIAGNOSIS — E8809 Other disorders of plasma-protein metabolism, not elsewhere classified: Secondary | ICD-10-CM

## 2017-04-08 DIAGNOSIS — R0989 Other specified symptoms and signs involving the circulatory and respiratory systems: Secondary | ICD-10-CM

## 2017-04-08 DIAGNOSIS — R0600 Dyspnea, unspecified: Secondary | ICD-10-CM | POA: Diagnosis not present

## 2017-04-08 DIAGNOSIS — J479 Bronchiectasis, uncomplicated: Secondary | ICD-10-CM | POA: Diagnosis not present

## 2017-04-08 DIAGNOSIS — J441 Chronic obstructive pulmonary disease with (acute) exacerbation: Secondary | ICD-10-CM | POA: Diagnosis not present

## 2017-04-08 DIAGNOSIS — J189 Pneumonia, unspecified organism: Secondary | ICD-10-CM | POA: Diagnosis not present

## 2017-04-08 DIAGNOSIS — R918 Other nonspecific abnormal finding of lung field: Secondary | ICD-10-CM | POA: Insufficient documentation

## 2017-04-08 DIAGNOSIS — I509 Heart failure, unspecified: Secondary | ICD-10-CM

## 2017-04-08 DIAGNOSIS — J9601 Acute respiratory failure with hypoxia: Secondary | ICD-10-CM | POA: Diagnosis not present

## 2017-04-08 DIAGNOSIS — M7989 Other specified soft tissue disorders: Secondary | ICD-10-CM

## 2017-04-08 DIAGNOSIS — I2510 Atherosclerotic heart disease of native coronary artery without angina pectoris: Secondary | ICD-10-CM

## 2017-04-08 DIAGNOSIS — J9621 Acute and chronic respiratory failure with hypoxia: Secondary | ICD-10-CM | POA: Diagnosis not present

## 2017-04-08 DIAGNOSIS — Z9981 Dependence on supplemental oxygen: Secondary | ICD-10-CM | POA: Diagnosis not present

## 2017-04-08 DIAGNOSIS — J439 Emphysema, unspecified: Secondary | ICD-10-CM | POA: Diagnosis not present

## 2017-04-08 DIAGNOSIS — B3781 Candidal esophagitis: Secondary | ICD-10-CM | POA: Diagnosis not present

## 2017-04-08 DIAGNOSIS — I5023 Acute on chronic systolic (congestive) heart failure: Secondary | ICD-10-CM | POA: Diagnosis not present

## 2017-04-08 DIAGNOSIS — R600 Localized edema: Secondary | ICD-10-CM | POA: Diagnosis not present

## 2017-04-08 DIAGNOSIS — R0602 Shortness of breath: Secondary | ICD-10-CM | POA: Diagnosis not present

## 2017-04-08 MED ORDER — FUROSEMIDE 40 MG PO TABS: 40.0000 mg | ORAL_TABLET | Freq: Two times a day (BID) | ORAL | 3 refills | Status: DC

## 2017-04-08 NOTE — Patient Instructions (Signed)
Plan:  I'll work on getting report of your heart ultrasound - please let me know when you have an appointment for the cardiologist  In the meantime, let's follow up here to monitor blood pressure and swelling  If chest pain or trouble breathing, please go to an ER right away

## 2017-04-08 NOTE — Progress Notes (Signed)
HPI: Bill Hinton is a 83 y.o. male  who presents to [facility] today, 04/08/17,  for chief complaint of:  Chief Complaint  Patient presents with  . Edema     . Context: Recently seen by pulmonology, echocardiogram was ordered but I am unable to see the results. Family states that heart was not pumping normally, I'm going to assume congestive heart failure . Location: LE bilateral . Quality: swelling, unable to get shoes on, swelling actually better a bit  . Duration: 2 weeks or so . Assoc signs/symptoms: no fever/chills    Past medical history, surgical history, social history and family history reviewed.  Patient Active Problem List   Diagnosis Date Noted  . Benign prostatic hyperplasia 12/14/2016  . CAD (coronary artery disease), native coronary artery 12/14/2016  . Depression with anxiety 12/14/2016  . History of nephrolithiasis 12/14/2016  . Pneumothorax 12/14/2016  . Tinnitus 12/14/2016  . Vitamin D deficiency 12/14/2016  . Rhinorrhea 12/14/2016  . Declining functional status 09/26/2016  . Pneumonia of both lungs due to infectious organism 09/19/2016  . Restless leg syndrome 09/19/2016  . Acute respiratory failure with hypoxia ([place]) 09/17/2016  . Bell's palsy 09/17/2016  . Bronchiectasis ([place]) 09/17/2016  . 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 Dysphagia 08/17/2016  . Vasomotor rhinitis 08/17/2016  . Right ureteral calculus 04/13/2015  . Atherosclerotic heart disease of native coronary artery without angina pectoris 04/11/2015  . Chronic kidney disease 04/11/2015  . Diaphragmatic hernia without obstruction or gangrene 04/11/2015  . Enlarged prostate without lower urinary tract symptoms (luts) 04/11/2015  . Essential (primary) hypertension 04/11/2015  . Nocturia 04/11/2015  . Other seasonal allergic rhinitis 04/11/2015  . Panic disorder without agoraphobia 04/11/2015  . Status post cataract  extraction 01/21/2014  . Presence of intraocular lens 01/21/2014  . Pseudophakia of both eyes 01/21/2014  . Nuclear sclerosis of right eye 01/19/2014  . 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 Pterygium of left eye 05/29/2012  . HLD (hyperlipidemia) 12/30/2008  . Steroid-dependent chronic obstructive pulmonary disease ([place]) 12/30/2008  . Gastroesophageal reflux disease without esophagitis 12/30/2008    Current medication list and allergy/intolerance information reviewed.   Current Outpatient Prescriptions on File Prior to Visit  Medication Sig Dispense Refill  . citalopram (CELEXA) 40 MG tablet Take 40 mg by mouth daily.    Marland Kitchen donepezil (ARICEPT) 10 MG tablet Take 10 mg by mouth daily.    Marland Kitchen esomeprazole (NEXIUM) 40 MG capsule Take 40 mg by mouth daily at 12 noon.    Marland Kitchen ipratropium-albuterol (DUONEB) 0.5-2.5 (3) MG/3ML SOLN Inhale into the lungs.    . lansoprazole (PREVACID) 30 MG capsule Take 1 capsule (30 mg total) by mouth daily. 90 capsule 3  . levofloxacin (LEVAQUIN) 750 MG tablet Take 1 tablet (750 mg total) by mouth daily. 5 tablet 0  . memantine (NAMENDA) 10 MG tablet Take 10 mg by mouth daily.  5  . montelukast (SINGULAIR) 10 MG tablet Take 10 mg by mouth daily.  11  . predniSONE (DELTASONE) 10 MG tablet 15 mg daily    . predniSONE (DELTASONE) 50 MG tablet Take 1 tablet (50 mg total) by mouth daily with breakfast. 5 tablet 0  . Propylene Glycol 0.6 % SOLN Apply to eye.    Marland Kitchen QUEtiapine (SEROQUEL) 50 MG tablet Take 50 mg by mouth 3 (three) times daily.  6  . rOPINIRole (REQUIP) 0.5 MG tablet Take 0.5 mg by mouth  daily.  11  . rosuvastatin (CRESTOR) 5 MG tablet Take 1 tablet (5 mg total) by mouth at bedtime. 90 tablet 3   No current facility-administered medications on file prior to visit.    Allergies  Allergen Reactions  . Hydromorphone Hcl       Review of Systems:  Constitutional: No recent illness  HEENT: No  headache, no  vision change  Cardiac: No  chest pain, No  pressure, No palpitations  Respiratory:  +shortness of breath is chronic. Gastrointestinal: No  abdominal pain though notes distension, no change on bowel habits  Musculoskeletal: No new myalgia/arthralgia  Skin: No  Rash  Neurologic: No  weakness, No  Dizziness   Exam:  BP (!) 155/70   Pulse 93   Wt 152 lb (68.9 kg)   BMI 21.20 kg/m   Constitutional: VS see above. General Appearance: alert, well-developed, well-nourished, NAD  Eyes: Normal lids and conjunctive, non-icteric sclera  Ears, Nose, Mouth, Throat: MMM, Normal external inspection ears/nares/mouth/lips/gums.  Neck: No masses, trachea midline. No JVD  Respiratory: Normal respiratory effort. Diminished breath sounds and very slight rales at bases of lungs.  Cardiovascular: S1/S2 normal, +murmur, +S3 faint, no rub/gallop auscultated. RRR. Significant 2+ pitting edema to knees  Musculoskeletal: Gait normal. Symmetric and independent movement of all extremities  Neurological: Normal balance/coordination. No tremor.  Skin: warm, dry, intact.   Psychiatric: Fair judgment/insight. Normal mood and affect. Oriented x2.    Recent Results (from the past 2160 hour(s))  CBC with Differential/Platelet     Status: Abnormal   Collection Time: 03/08/17  9:24 AM  Result Value Ref Range   WBC 14.8 (H) 3.8 - 10.8 K/uL   RBC 4.55 4.20 - 5.80 MIL/uL   Hemoglobin 14.1 13.2 - 17.1 g/dL   HCT 43.6 38.5 - 50.0 %   MCV 95.8 80.0 - 100.0 fL   MCH 31.0 27.0 - 33.0 pg   MCHC 32.3 32.0 - 36.0 g/dL   RDW 14.7 11.0 - 15.0 %   Platelets 196 140 - 400 K/uL   MPV 10.7 7.5 - 12.5 fL   Neutro Abs 12,284 (H) 1,500 - 7,800 cells/uL   Lymphs Abs 1,332 850 - 3,900 cells/uL   Monocytes Absolute 888 200 - 950 cells/uL   Eosinophils Absolute 296 15 - 500 cells/uL   Basophils Absolute 0 0 - 200 cells/uL   Neutrophils Relative % 83 %   Lymphocytes Relative 9 %   Monocytes Relative 6 %   Eosinophils  Relative 2 %   Basophils Relative 0 %   Smear Review Criteria for review not met   COMPLETE METABOLIC PANEL WITH GFR     Status: Abnormal   Collection Time: 03/08/17  9:24 AM  Result Value Ref Range   Sodium 142 135 - 146 mmol/L   Potassium 3.6 3.5 - 5.3 mmol/L   Chloride 103 98 - 110 mmol/L   CO2 28 20 - 31 mmol/L   Glucose, Bld 89 65 - 99 mg/dL   BUN 11 7 - 25 mg/dL   Creat 0.91 0.70 - 1.11 mg/dL    Comment:   For patients > or = 81 years of age: The upper reference limit for Creatinine is approximately 13% higher for people identified as African-American.      Total Bilirubin 0.9 0.2 - 1.2 mg/dL   Alkaline Phosphatase 101 40 - 115 U/L   AST 13 10 - 35 U/L   ALT 20 9 - 46 U/L   Total Protein  5.2 (L) 6.1 - 8.1 g/dL   Albumin 2.8 (L) 3.6 - 5.1 g/dL   Calcium 9.0 8.6 - 10.3 mg/dL   GFR, Est African American >89 >=60 mL/min   GFR, Est Non African American 78 >=60 mL/min  TSH     Status: None   Collection Time: 03/08/17  9:24 AM  Result Value Ref Range   TSH 1.62 0.40 - 4.50 mIU/L  T4, free     Status: None   Collection Time: 03/08/17  9:24 AM  Result Value Ref Range   Free T4 1.1 0.8 - 1.8 ng/dL  Hemoglobin A1c     Status: Abnormal   Collection Time: 03/08/17  9:24 AM  Result Value Ref Range   Hgb A1c MFr Bld 6.5 (H) <5.7 %    Comment:   For someone without known diabetes, a hemoglobin A1c value of 6.5% or greater indicates that they may have diabetes and this should be confirmed with a follow-up test.   For someone with known diabetes, a value <7% indicates that their diabetes is well controlled and a value greater than or equal to 7% indicates suboptimal control. A1c targets should be individualized based on duration of diabetes, age, comorbid conditions, and other considerations.   Currently, no consensus exists for use of hemoglobin A1c for diagnosis of diabetes for children.      Mean Plasma Glucose 140 mg/dL  VITAMIN D 25 Hydroxy (Vit-D Deficiency,  Fractures)     Status: None   Collection Time: 03/08/17  9:24 AM  Result Value Ref Range   Vit D, 25-Hydroxy 33 30 - 100 ng/mL    Comment: Vitamin D Status           25-OH Vitamin D        Deficiency                <20 ng/mL        Insufficiency         20 - 29 ng/mL        Optimal             > or = 30 ng/mL   For 25-OH Vitamin D testing on patients on D2-supplementation and patients for whom quantitation of D2 and D3 fractions is required, the [facility] 25-OH VIT D, (D2,D3), LC/MS/MS is recommended: order code [id number] (patients > 2 yrs).   Urine Microscopic     Status: None   Collection Time: 03/08/17  9:24 AM  Result Value Ref Range   WBC, UA 0-5 <=5 WBC/HPF   RBC / HPF 0-2 <=2 RBC/HPF   Squamous Epithelial / LPF NONE SEEN <=5 HPF   Bacteria, UA NONE SEEN NONE SEEN HPF   Crystals NONE SEEN NONE SEEN HPF   Casts NONE SEEN NONE SEEN LPF   Yeast NONE SEEN NONE SEEN HPF  CBC with Differential/Platelet     Status: Abnormal   Collection Time: 03/13/17 11:12 AM  Result Value Ref Range   WBC 13.7 (H) 3.8 - 10.8 K/uL   RBC 4.35 4.20 - 5.80 MIL/uL   Hemoglobin 13.7 13.2 - 17.1 g/dL   HCT 42.0 38.5 - 50.0 %   MCV 96.6 80.0 - 100.0 fL   MCH 31.5 27.0 - 33.0 pg   MCHC 32.6 32.0 - 36.0 g/dL   RDW 14.8 11.0 - 15.0 %   Platelets 204 140 - 400 K/uL   MPV 10.1 7.5 - 12.5 fL   Neutro Abs 10,960 (H) 1,500 - 7,800  cells/uL   Lymphs Abs 1,781 850 - 3,900 cells/uL   Monocytes Absolute 685 200 - 950 cells/uL   Eosinophils Absolute 274 15 - 500 cells/uL   Basophils Absolute 0 0 - 200 cells/uL   Neutrophils Relative % 80 %   Lymphocytes Relative 13 %   Monocytes Relative 5 %   Eosinophils Relative 2 %   Basophils Relative 0 %   Smear Review Criteria for review not met   Pathologist smear review     Status: None   Collection Time: 03/14/17  9:55 AM  Result Value Ref Range   Path Review SEE NOTE     Comment: Leukocytosis due to absolute granulocytosis. Myeloid population consists  predominantly of mature segmented neutrophils with reactive changes. No immature cells are identified.  Platelets are unremarkable. A few Elliptocytes and slight polychromasia noted in RBCs. Reviewed by Francis Gaines Mammarappallil MD (Electronic Signature on File) 03/15/2017.   COMPLETE METABOLIC PANEL WITH GFR     Status: Abnormal   Collection Time: 04/08/17 12:29 PM  Result Value Ref Range   Sodium 143 135 - 146 mmol/L   Potassium 3.6 3.5 - 5.3 mmol/L   Chloride 106 98 - 110 mmol/L   CO2 28 20 - 31 mmol/L   Glucose, Bld 77 65 - 99 mg/dL   BUN 14 7 - 25 mg/dL   Creat 0.97 0.70 - 1.11 mg/dL    Comment:   For patients > or = 81 years of age: The upper reference limit for Creatinine is approximately 13% higher for people identified as African-American.      Total Bilirubin 1.3 (H) 0.2 - 1.2 mg/dL   Alkaline Phosphatase 80 40 - 115 U/L   AST 20 10 - 35 U/L   ALT 24 9 - 46 U/L   Total Protein 5.0 (L) 6.1 - 8.1 g/dL   Albumin 3.1 (L) 3.6 - 5.1 g/dL   Calcium 8.9 8.6 - 10.3 mg/dL   GFR, Est African American 83 >=60 mL/min   GFR, Est Non African American 72 >=60 mL/min  Lipid panel     Status: None   Collection Time: 04/08/17 12:29 PM  Result Value Ref Range   Cholesterol 177 <200 mg/dL   Triglycerides 90 <150 mg/dL   HDL 105 >40 mg/dL   Total CHOL/HDL Ratio 1.7 <5.0 Ratio   VLDL 18 <30 mg/dL   LDL Cholesterol 54 <100 mg/dL  Microalbumin / creatinine urine ratio     Status: None   Collection Time: 04/08/17 12:29 PM  Result Value Ref Range   Creatinine, Urine 120 20 - 370 mg/dL   Microalb, Ur 3.0 Not estab mg/dL   Microalb Creat Ratio 25 <30 mcg/mg creat    Comment: The ADA has defined abnormalities in albumin excretion as follows:           Category           Result                            (mcg/mg creatinine)                 Normal:    <30       Microalbuminuria:    30 - 299   Clinical albuminuria:    > or = 300   The ADA recommends that at least two of three  specimens collected within a 3 - 6 month period be abnormal before considering a  patient to be within a diagnostic category.     Urine Microscopic     Status: Abnormal   Collection Time: 04/08/17 12:29 PM  Result Value Ref Range   WBC, UA 0-5 <=5 WBC/HPF   RBC / HPF 0-2 <=2 RBC/HPF   Squamous Epithelial / LPF NONE SEEN <=5 HPF   Bacteria, UA NONE SEEN NONE SEEN HPF   Crystals See Below (A) NONE SEEN HPF    Comment: Calcium Oxalate Crystals             FEW       ABN   NONE SEEN     HPF   Casts NONE SEEN NONE SEEN LPF   Yeast NONE SEEN NONE SEEN HPF  CP4508-PT/INR AND PTT     Status: None   Collection Time: 04/08/17 12:29 PM  Result Value Ref Range   Prothrombin Time 10.7 9.0 - 11.5 sec    Comment:   For more information on this test, go to: http://education.[facility].com/faq/FAQ104      INR 1.0     Comment:   Reference Range                        0.9-1.1 Moderate-intensity Warfarin Therapy    2.0-3.0 Higher-intensity Warfarin Therapy      3.0-4.0      aPTT 27 22 - 34 sec    Comment:   This test has not been validated for monitoring unfractionated heparin therapy. For testing that is validated for this type of therapy, please refer to the Heparin Anti-Xa assay (test code [phone]).   For additional information, please refer to http://education.[facility].com/faq/FAQ159 (This link is being provided for informational/educational purposes only.)       Dg Chest 2 View  Result Date: 04/08/2017 CLINICAL DATA:  Cough for months EXAM: CHEST  2 VIEW COMPARISON:  03/15/2017 FINDINGS: Cardiac shadow is within normal limits. The lungs are hyperinflated. Previously seen pleuroparenchymal scarring is again identified. Some interval clearing in the right apex is noted. Mild interstitial changes are again seen. No new focal abnormality is noted. IMPRESSION: Some clearing of right upper lobe infiltrate. Chronic changes similar to that seen on prior exam. Electronically Signed    By: Inez Catalina M.D.   On: 04/08/2017 15:01   US Venous Img Lower Bilateral  Result Date: 04/08/2017 CLINICAL DATA:  Bilateral lower extremity edema EXAM: BILATERAL LOWER EXTREMITY VENOUS DOPPLER ULTRASOUND TECHNIQUE: Gray-scale sonography with graded compression, as well as color Doppler and duplex ultrasound were performed to evaluate the lower extremity deep venous systems from the level of the common femoral vein and including the common femoral, femoral, profunda femoral, popliteal and calf veins including the posterior tibial, peroneal and gastrocnemius veins when visible. The superficial great saphenous vein was also interrogated. Spectral Doppler was utilized to evaluate flow at rest and with distal augmentation maneuvers in the common femoral, femoral and popliteal veins. COMPARISON:  None. FINDINGS: RIGHT LOWER EXTREMITY Common Femoral Vein: No evidence of thrombus. Normal compressibility, respiratory phasicity and response to augmentation. Saphenofemoral Junction: No evidence of thrombus. Normal compressibility and flow on color Doppler imaging. Profunda Femoral Vein: No evidence of thrombus. Normal compressibility and flow on color Doppler imaging. Femoral Vein: No evidence of thrombus. Normal compressibility, respiratory phasicity and response to augmentation. Popliteal Vein: No evidence of thrombus. Normal compressibility, respiratory phasicity and response to augmentation. Calf Veins: Limited visualization, no visible thrombus. Superficial Great Saphenous Vein: No evidence of thrombus. Normal compressibility and flow  on color Doppler imaging. Venous Reflux:  None. Other Findings:  None. LEFT LOWER EXTREMITY Common Femoral Vein: No evidence of thrombus. Normal compressibility, respiratory phasicity and response to augmentation. Saphenofemoral Junction: No evidence of thrombus. Normal compressibility and flow on color Doppler imaging. Profunda Femoral Vein: No evidence of thrombus. Normal  compressibility and flow on color Doppler imaging. Femoral Vein: No evidence of thrombus. Normal compressibility, respiratory phasicity and response to augmentation. Popliteal Vein: No evidence of thrombus. Normal compressibility, respiratory phasicity and response to augmentation. Calf Veins: Limited visualization, no visible thrombus. Superficial Great Saphenous Vein: No evidence of thrombus. Normal compressibility and flow on color Doppler imaging. Venous Reflux:  None. Other Findings:  None. IMPRESSION: No evidence of DVT within either lower extremity. Electronically Signed   By: Rolm Baptise M.D.   On: 04/08/2017 14:12     ASSESSMENT/PLAN:   Would like to get echocardiogram results for full details, but appears to be in clinical heart failure at this point.  Doubtful for DVT but given significant swelling, sedentary lifestyle, is on chronic steroids, we'll go ahead and get a sounds.  Chest x-ray on personal review no pulmonary edema/effusion  Edema likely multifactorial, sounds like CHF complicated by hypoalbuminemia, likely peripheral vascular disease  Restart diuresis. Check labs. Close follow-up in the office  Acute on chronic congestive heart failure, unspecified heart failure type (HCC)  Swelling of both lower extremities - Plan: COMPLETE METABOLIC PANEL WITH GFR, Lipid panel, Microalbumin / creatinine urine ratio, Urine Microscopic, CP4508-PT/INR AND PTT, furosemide (LASIX) 40 MG tablet, US Venous Img Lower Bilateral  Hypoalbuminemia - Plan: COMPLETE METABOLIC PANEL WITH GFR, Lipid panel, Microalbumin / creatinine urine ratio, Urine Microscopic, CP4508-PT/INR AND PTT  Essential (primary) hypertension  Rales - Plan: DG Chest 2 View    Patient Instructions  Plan:  I'll work on getting report of your heart ultrasound - please let me know when you have an appointment for the cardiologist  In the meantime, let's follow up here to monitor blood pressure and swelling  If chest  pain or trouble breathing, please go to an ER right away     Follow-up plan: Return for recheck swelling & blood pressure and review labs 2-3 days .  Visit summary with medication list and pertinent instructions was printed for patient to review, alert [place] if any changes needed. All questions at time of visit were answered - patient instructed to contact office with any additional concerns. ER/RTC precautions were reviewed with the patient and understanding verbalized.

## 2017-04-09 DIAGNOSIS — R0609 Other forms of dyspnea: Secondary | ICD-10-CM | POA: Diagnosis not present

## 2017-04-09 DIAGNOSIS — N401 Enlarged prostate with lower urinary tract symptoms: Secondary | ICD-10-CM | POA: Diagnosis present

## 2017-04-09 DIAGNOSIS — D72829 Elevated white blood cell count, unspecified: Secondary | ICD-10-CM | POA: Diagnosis not present

## 2017-04-09 DIAGNOSIS — J9621 Acute and chronic respiratory failure with hypoxia: Secondary | ICD-10-CM | POA: Diagnosis present

## 2017-04-09 DIAGNOSIS — K228 Other specified diseases of esophagus: Secondary | ICD-10-CM | POA: Diagnosis not present

## 2017-04-09 DIAGNOSIS — J189 Pneumonia, unspecified organism: Secondary | ICD-10-CM | POA: Diagnosis present

## 2017-04-09 DIAGNOSIS — I5023 Acute on chronic systolic (congestive) heart failure: Secondary | ICD-10-CM | POA: Diagnosis present

## 2017-04-09 DIAGNOSIS — R1319 Other dysphagia: Secondary | ICD-10-CM | POA: Diagnosis present

## 2017-04-09 DIAGNOSIS — Z87891 Personal history of nicotine dependence: Secondary | ICD-10-CM | POA: Diagnosis not present

## 2017-04-09 DIAGNOSIS — E785 Hyperlipidemia, unspecified: Secondary | ICD-10-CM | POA: Diagnosis present

## 2017-04-09 DIAGNOSIS — D696 Thrombocytopenia, unspecified: Secondary | ICD-10-CM | POA: Diagnosis present

## 2017-04-09 DIAGNOSIS — R600 Localized edema: Secondary | ICD-10-CM | POA: Diagnosis not present

## 2017-04-09 DIAGNOSIS — K209 Esophagitis, unspecified: Secondary | ICD-10-CM | POA: Diagnosis not present

## 2017-04-09 DIAGNOSIS — N400 Enlarged prostate without lower urinary tract symptoms: Secondary | ICD-10-CM | POA: Diagnosis not present

## 2017-04-09 DIAGNOSIS — J9612 Chronic respiratory failure with hypercapnia: Secondary | ICD-10-CM | POA: Diagnosis present

## 2017-04-09 DIAGNOSIS — G2581 Restless legs syndrome: Secondary | ICD-10-CM | POA: Diagnosis present

## 2017-04-09 DIAGNOSIS — K5909 Other constipation: Secondary | ICD-10-CM | POA: Diagnosis present

## 2017-04-09 DIAGNOSIS — Z9981 Dependence on supplemental oxygen: Secondary | ICD-10-CM | POA: Diagnosis not present

## 2017-04-09 DIAGNOSIS — R0600 Dyspnea, unspecified: Secondary | ICD-10-CM | POA: Diagnosis not present

## 2017-04-09 DIAGNOSIS — R918 Other nonspecific abnormal finding of lung field: Secondary | ICD-10-CM | POA: Insufficient documentation

## 2017-04-09 DIAGNOSIS — N481 Balanitis: Secondary | ICD-10-CM | POA: Diagnosis present

## 2017-04-09 DIAGNOSIS — F329 Major depressive disorder, single episode, unspecified: Secondary | ICD-10-CM | POA: Diagnosis present

## 2017-04-09 DIAGNOSIS — K219 Gastro-esophageal reflux disease without esophagitis: Secondary | ICD-10-CM | POA: Diagnosis present

## 2017-04-09 DIAGNOSIS — R3911 Hesitancy of micturition: Secondary | ICD-10-CM | POA: Diagnosis present

## 2017-04-09 DIAGNOSIS — R1310 Dysphagia, unspecified: Secondary | ICD-10-CM | POA: Diagnosis not present

## 2017-04-09 DIAGNOSIS — R1084 Generalized abdominal pain: Secondary | ICD-10-CM | POA: Diagnosis not present

## 2017-04-09 DIAGNOSIS — R0602 Shortness of breath: Secondary | ICD-10-CM | POA: Diagnosis not present

## 2017-04-09 DIAGNOSIS — J929 Pleural plaque without asbestos: Secondary | ICD-10-CM | POA: Diagnosis not present

## 2017-04-09 DIAGNOSIS — J9601 Acute respiratory failure with hypoxia: Secondary | ICD-10-CM | POA: Diagnosis not present

## 2017-04-09 DIAGNOSIS — J441 Chronic obstructive pulmonary disease with (acute) exacerbation: Secondary | ICD-10-CM | POA: Diagnosis not present

## 2017-04-09 DIAGNOSIS — N200 Calculus of kidney: Secondary | ICD-10-CM | POA: Diagnosis not present

## 2017-04-09 DIAGNOSIS — J8410 Pulmonary fibrosis, unspecified: Secondary | ICD-10-CM | POA: Diagnosis not present

## 2017-04-09 DIAGNOSIS — B3781 Candidal esophagitis: Secondary | ICD-10-CM | POA: Diagnosis present

## 2017-04-09 DIAGNOSIS — E119 Type 2 diabetes mellitus without complications: Secondary | ICD-10-CM | POA: Diagnosis present

## 2017-04-09 DIAGNOSIS — K449 Diaphragmatic hernia without obstruction or gangrene: Secondary | ICD-10-CM | POA: Diagnosis present

## 2017-04-09 DIAGNOSIS — I110 Hypertensive heart disease with heart failure: Secondary | ICD-10-CM | POA: Diagnosis present

## 2017-04-09 DIAGNOSIS — I723 Aneurysm of iliac artery: Secondary | ICD-10-CM | POA: Diagnosis not present

## 2017-04-09 DIAGNOSIS — R42 Dizziness and giddiness: Secondary | ICD-10-CM | POA: Diagnosis present

## 2017-04-09 DIAGNOSIS — R609 Edema, unspecified: Secondary | ICD-10-CM | POA: Diagnosis not present

## 2017-04-09 DIAGNOSIS — N289 Disorder of kidney and ureter, unspecified: Secondary | ICD-10-CM | POA: Diagnosis not present

## 2017-04-09 DIAGNOSIS — J471 Bronchiectasis with (acute) exacerbation: Secondary | ICD-10-CM | POA: Diagnosis present

## 2017-04-09 DIAGNOSIS — I77811 Abdominal aortic ectasia: Secondary | ICD-10-CM | POA: Diagnosis not present

## 2017-04-09 DIAGNOSIS — R079 Chest pain, unspecified: Secondary | ICD-10-CM | POA: Diagnosis not present

## 2017-04-09 DIAGNOSIS — J440 Chronic obstructive pulmonary disease with acute lower respiratory infection: Secondary | ICD-10-CM | POA: Diagnosis present

## 2017-04-09 DIAGNOSIS — R1312 Dysphagia, oropharyngeal phase: Secondary | ICD-10-CM | POA: Diagnosis not present

## 2017-04-09 DIAGNOSIS — E876 Hypokalemia: Secondary | ICD-10-CM | POA: Diagnosis present

## 2017-04-09 DIAGNOSIS — I2510 Atherosclerotic heart disease of native coronary artery without angina pectoris: Secondary | ICD-10-CM | POA: Diagnosis not present

## 2017-04-09 DIAGNOSIS — J439 Emphysema, unspecified: Secondary | ICD-10-CM | POA: Diagnosis not present

## 2017-04-09 LAB — COMPLETE METABOLIC PANEL WITH GFR
ALT: 24 U/L (ref 9–46)
AST: 20 U/L (ref 10–35)
Albumin: 3.1 g/dL — ABNORMAL LOW (ref 3.6–5.1)
Alkaline Phosphatase: 80 U/L (ref 40–115)
BUN: 14 mg/dL (ref 7–25)
CHLORIDE: 106 mmol/L (ref 98–110)
CO2: 28 mmol/L (ref 20–31)
CREATININE: 0.97 mg/dL (ref 0.70–1.11)
Calcium: 8.9 mg/dL (ref 8.6–10.3)
GFR, EST AFRICAN AMERICAN: 83 mL/min (ref >=60)
GFR, Est Non African American: 72 mL/min (ref >=60)
GLUCOSE: 77 mg/dL (ref 65–99)
Potassium: 3.6 mmol/L (ref 3.5–5.3)
SODIUM: 143 mmol/L (ref 135–146)
Total Bilirubin: 1.3 mg/dL — ABNORMAL HIGH (ref 0.2–1.2)
Total Protein: 5 g/dL — ABNORMAL LOW (ref 6.1–8.1)

## 2017-04-09 LAB — URINALYSIS, MICROSCOPIC ONLY
BACTERIA UA: NONE SEEN [HPF]
Casts: NONE SEEN [LPF]
Squamous Epithelial / LPF: NONE SEEN [HPF] (ref <=5)
YEAST: NONE SEEN [HPF]

## 2017-04-09 LAB — CP4508-PT/INR AND PTT
INR: 1
Prothrombin Time: 10.7 s (ref 9.0–11.5)
aPTT: 27 s (ref 22–34)

## 2017-04-09 LAB — LIPID PANEL
Cholesterol: 177 mg/dL (ref <200)
HDL: 105 mg/dL (ref >40)
LDL Cholesterol: 54 mg/dL (ref <100)
TRIGLYCERIDES: 90 mg/dL (ref <150)
Total CHOL/HDL Ratio: 1.7 Ratio (ref <5.0)
VLDL: 18 mg/dL (ref <30)

## 2017-04-09 LAB — MICROALBUMIN / CREATININE URINE RATIO
Creatinine, Urine: 120 mg/dL (ref 20–370)
MICROALB/CREAT RATIO: 25 ug/mg{creat} (ref <30)
Microalb, Ur: 3 mg/dL

## 2017-04-10 ENCOUNTER — Ambulatory Visit: Payer: Medicare Other | Admitting: Osteopathic Medicine

## 2017-04-10 ENCOUNTER — Telehealth: Payer: Self-pay | Admitting: Osteopathic Medicine

## 2017-04-10 NOTE — Telephone Encounter (Signed)
Pt's wife called to cancel his appointment for today and wanted me to let you know he is in the hospital. Thanks

## 2017-04-10 NOTE — Telephone Encounter (Signed)
noted 

## 2017-04-25 ENCOUNTER — Ambulatory Visit (INDEPENDENT_AMBULATORY_CARE_PROVIDER_SITE_OTHER): Payer: Medicare Other | Admitting: Osteopathic Medicine

## 2017-04-25 ENCOUNTER — Encounter: Payer: Self-pay | Admitting: Osteopathic Medicine

## 2017-04-25 VITALS — BP 113/60 | HR 81 | Temp 98.0°F

## 2017-04-25 DIAGNOSIS — I5022 Chronic systolic (congestive) heart failure: Secondary | ICD-10-CM

## 2017-04-25 DIAGNOSIS — J449 Chronic obstructive pulmonary disease, unspecified: Secondary | ICD-10-CM

## 2017-04-25 DIAGNOSIS — D649 Anemia, unspecified: Secondary | ICD-10-CM | POA: Diagnosis not present

## 2017-04-25 DIAGNOSIS — E876 Hypokalemia: Secondary | ICD-10-CM | POA: Diagnosis not present

## 2017-04-25 DIAGNOSIS — I2510 Atherosclerotic heart disease of native coronary artery without angina pectoris: Secondary | ICD-10-CM

## 2017-04-25 DIAGNOSIS — M7989 Other specified soft tissue disorders: Secondary | ICD-10-CM

## 2017-04-25 DIAGNOSIS — E8809 Other disorders of plasma-protein metabolism, not elsewhere classified: Secondary | ICD-10-CM

## 2017-04-25 DIAGNOSIS — R5381 Other malaise: Secondary | ICD-10-CM | POA: Diagnosis not present

## 2017-04-25 MED ORDER — FUROSEMIDE 20 MG PO TABS: 20.0000 mg | ORAL_TABLET | Freq: Every day | ORAL | 1 refills | Status: DC

## 2017-04-25 MED ORDER — AMBULATORY NON FORMULARY MEDICATION: 99 refills | Status: DC

## 2017-04-25 NOTE — Patient Instructions (Addendum)
PLAN  Decrease Lasix/Furosemide to 20 mg once per day  If weight gain of 3 pounds in 1 day or 5 pounds in 2 days, increase Lasix to 40 mg twice per day and call Dr. Sheppard Coil  If top number on blood pressure drops below 100 or bottom number drops below 60, he needs to be seen in a medical office or emergency department, especially if he is having other symptoms of weakness or shaking.  Decrease metoprolol from 25 mg to 12.5 mg by taking half tablet daily  We will recheck his potassium levels and determine if he continues to need supplementation on this. If so, I will send to the pharmacy   We are getting other labs today to make sure kidney function and other basic organ parameters are okay on current medications  Would recommend follow-up with cardiologist to determine any further recommendations regarding...  Appropriate medication management for heart failure (such as alternatives to Lasix, metoprolol)  What additional tests or procedures he may need  We'll see if we can get home health out to help with physical therapy/rehabilitation  They should also be able to get commode chair taken care of, we will fax them a prescription for this  Would recommend checking with GI regarding the swallowing issue - I will have home health try to do another swallow study in the meantime. We will follow up on this next week   Questions - call me!

## 2017-04-25 NOTE — Progress Notes (Addendum)
HPI: Bill Hinton is a 83 y.o. male  who presents to [facility] today, 04/25/17,  for chief complaint of:  Chief Complaint  Patient presents with  . Hospitalization Follow-up   Patient recently hospitalized for shortness of breath secondary to community-acquired pneumonia comorbidity with CHF exacerbation. Hospital stay also complicated by dysphagia. See below for review of discharge summary.  Today, patient is accompanied by wife and one of his daughters. He states that since being out of the hospital, he is breathing better but otherwise very tired.  Daughter and wife have some concerns about medications.  He had episode of hypotension few days ago, was initially going to be taken to the emergency department but blood pressure normalized. Daughter states it was as low as 60s over 40s on home blood pressure cuff he was tremulous and tired. Concerns that his Lasix dose might be too high.  CHF: No chest pain or palpitations, lower extremity swelling has significantly improved. He has follow-up in place with cardiology  COPD: Breathing about at baseline, no cough.  Dysphagia: Diagnosed with esophageal candida I GI, has completed course of fluconazole. Continues to have some difficulty swallowing larger pills. Swallow study normal in hospital except for some barium getting stuck at distal esophagus.    Past medical, surgical, social and family history reviewed: Patient Active Problem List   Diagnosis Date Noted  . Benign prostatic hyperplasia 12/14/2016  . CAD (coronary artery disease), native coronary artery 12/14/2016  . Depression with anxiety 12/14/2016  . History of nephrolithiasis 12/14/2016  . Pneumothorax 12/14/2016  . Tinnitus 12/14/2016  . Vitamin D deficiency 12/14/2016  . Rhinorrhea 12/14/2016  . Declining functional status 09/26/2016  . Pneumonia of both lungs due to infectious organism 09/19/2016  . Restless leg syndrome 09/19/2016   . Acute respiratory failure with hypoxia ([place]) 09/17/2016  . Bell's palsy 09/17/2016  . Bronchiectasis ([place]) 09/17/2016  . Mounier-Kuhn bronchiectasis with acute exacerbation ([place]) 08/17/2016  . Dysphagia 08/17/2016  . Vasomotor rhinitis 08/17/2016  . Right ureteral calculus 04/13/2015  . Atherosclerotic heart disease of native coronary artery without angina pectoris 04/11/2015  . Chronic kidney disease 04/11/2015  . Diaphragmatic hernia without obstruction or gangrene 04/11/2015  . Enlarged prostate without lower urinary tract symptoms (luts) 04/11/2015  . Essential (primary) hypertension 04/11/2015  . Nocturia 04/11/2015  . Other seasonal allergic rhinitis 04/11/2015  . Panic disorder without agoraphobia 04/11/2015  . Status post cataract extraction 01/21/2014  . Presence of intraocular lens 01/21/2014  . Pseudophakia of both eyes 01/21/2014  . Nuclear sclerosis of right eye 01/19/2014  . 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 Pterygium of left eye 05/29/2012  . HLD (hyperlipidemia) 12/30/2008  . 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 Gastroesophageal reflux disease without esophagitis 12/30/2008   Past Surgical History:  Procedure Laterality Date  . CHOLECYSTECTOMY    . HERNIA REPAIR     Social History  Substance Use Topics  . Smoking status: Former Research scientist (life sciences)  . Smokeless tobacco: Never Used  . Alcohol use No   Family History  Problem Relation Age of Onset  . Heart attack Mother   . Heart attack Father      Current medication list and allergy/intolerance information reviewed:    Medications at time of discharge:  acyclovir (ZOVIRAX) 800 MG tablet  Take 800 mg by mouth daily.      aspirin 81 mg chewable tablet  Chew one tablet (81 mg  total) by mouth daily. 30 tablet  0 04/15/2017 04/15/2018  budesonide (PULMICORT) 0.25 mg/2 mL nebulizer solution  Indications: Chronic  obstructive pulmonary disease with acute lower respiratory infection (*), Abnormal CT of the chest Take 2 mLs (0.25 mg total) by nebulization 2 (two) times daily. 60 ampule  12 05/02/2016   Cholecalciferol (VITAMIN D3) 1000 UNITS CAPS  Take 1 tablet by mouth every morning.       citalopram (CELEXA) 40 mg tablet  Take 40 mg by mouth every morning.      clotrimazole (LOTRIMIN) 1% cream  Apply topically 2 (two) times daily. 30 g  0 04/15/2017 04/15/2018  donepezil HCl (ARICEPT) 10 mg tablet  Take 10 mg by mouth 2 (two) times daily.      furosemide (LASIX) 40 mg tablet  Indications: Edema, unspecified type, Shortness of breath Take one tablet (40 mg total) by mouth daily as needed (as needed for weight gain greater than 3 pounds). 30 tablet  0 04/15/2017   ipratropium (ATROVENT) 0.03% nasal spray  Indications: Vasomotor rhinitis, unspecified chronicity two sprays by Nasal route 4 (four) times daily. 30 mL  5 08/17/2016 08/17/2017  ipratropium-albuterol (DUONEB) 0.5-2.5 mg/3 mL ML SOLN nebulizer solution  Inhale 1 ampule into the lungs every 6 (six) hours as needed (sob/wheezing).   11 12/13/2015   ipratropium-albuterol (DUONEB) 0.5-2.5 mg/3 mL ML SOLN nebulizer solution  Take 3 mLs by nebulization 3 (three) times a day. 360 mL  0 09/21/2016   isosorbide mononitrate (IMDUR) 30 mg 24 hr tablet  Take one half tablet (15 mg total) by mouth daily. 30 tablet  0 04/15/2017   lansoprazole (PREVACID) 30 mg capsule  Take 30 mg by mouth every morning.      loratadine (CLARITIN) 10 MG tablet  Take 10 mg by mouth daily.      memantine HCl (NAMENDA) 10 mg tablet  Take 10 mg by mouth 2 (two) times daily.   3 06/22/2016   metoprolol succinate (TOPROL-XL) 25 mg 24 hr tablet  Take one tablet (25 mg total) by mouth daily. 30 tablet  0 04/15/2017   montelukast (SINGULAIR) 10 MG tablet  TAKE 1 TABLET BY MOUTH DAILY 30 tablet  11 09/13/2016   Multiple Vitamins-Minerals  (PRESERVISION/LUTEIN) CAPS  Take 1 capsule by mouth 2 (two) times daily.      multivitamin with minerals (CENTRUM) tablet  Take 1 tablet by mouth daily.      nitroGLYCERIN (NITROSTAT) 0.4 mg SL tablet  Take 1 tablet at the onset of chest pain (may repeat every 5 minutes x 3 dose - if no relief in pain after 3 doses call 911)   06/02/2015   predniSONE (DELTASONE) 5 mg tablet  Take 40mg  daily. Reduce by one tablet every FOUR days until taking 15mg  (THREE TABLETS) daily 100 tablet  3 04/15/2017   QUEtiapine fumarate (SEROQUEL) 50 mg tablet  Take 50 mg by mouth at bedtime.      ropinirole (REQUIP) 0.5 MG tablet  Take 0.5 mg by mouth 3 (three) times a day.      rosuvastatin calcium (CRESTOR) 10 mg tablet  Take 5 mg by mouth every other day.       tamsulosin (FLOMAX) 0.4 mg CAPS  Take one capsule (0.4 mg total) by mouth daily. 30 capsule  0 04/15/2017   amoxicillin-clavulanate (AUGMENTIN) 875-125 mg per tablet  Take one tablet by mouth every 12 (twelve) hours. 8 tablet  0 04/15/2017 04/19/2017  fluconazole (DIFLUCAN) 100 mg tablet  Indications: candida esophagitis Take one tablet (100 mg total) by mouth daily. 5 tablet  0 04/15/2017 04/20/2017      Allergies  Allergen Reactions  . Hydromorphone Hcl       Review of Systems:  Constitutional:  No  fever, +recent illness, No unintentional weight changes. +significant fatigue.   HEENT: No  headache, no vision change  Cardiac: No  chest pain, No  pressure, No palpitations  Respiratory:  No  shortness of breath. No  Cough  Gastrointestinal: No  abdominal pain, No  nausea, No  vomiting,  No  blood in stool, No  diarrhea, No  constipation   Musculoskeletal: No new myalgia/arthralgia  Neurologic: No  dizziness, No  slurred speech/focal weakness/facial droop   Exam:  BP 113/60   Pulse 81   Temp 98 F (36.7 C) (Oral)   SpO2 96%   Constitutional: VS see above. General Appearance: alert, well-developed,  well-nourished, NAD  Eyes: Normal lids and conjunctive, non-icteric sclera  Ears, Nose, Mouth, Throat: MMM, Normal external inspection ears/nares/mouth/lips/gums.   Neck: No masses, trachea midline.   Respiratory: Normal respiratory effort. no wheeze, no rhonchi, no rales  Cardiovascular: S1/S2 normal, no murmur, no rub/gallop auscultated. RRR. No lower extremity edema except trace pedal edema  Musculoskeletal: Gait normal. No clubbing/cyanosis of digits.   Neurological: Normal balance/coordination. No tremor.   Skin: warm, dry, intact. No rash/ulcer.    Psychiatric: Fair judgment/insight. Normal mood and affect.  Results for orders placed or performed in visit on 04/25/17 (from the past 48 hour(s))  CBC     Status: Abnormal   Collection Time: 04/25/17  3:28 PM  Result Value Ref Range   WBC 12.4 (H) 3.8 - 10.8 K/uL   RBC 3.92 (L) 4.20 - 5.80 MIL/uL   Hemoglobin 12.8 (L) 13.2 - 17.1 g/dL   HCT 39.2 38.5 - 50.0 %   MCV 100.0 80.0 - 100.0 fL   MCH 32.7 27.0 - 33.0 pg   MCHC 32.7 32.0 - 36.0 g/dL   RDW 15.1 (H) 11.0 - 15.0 %   Platelets 161 140 - 400 K/uL   MPV 10.3 7.5 - 12.5 fL  COMPLETE METABOLIC PANEL WITH GFR     Status: Abnormal   Collection Time: 04/25/17  3:28 PM  Result Value Ref Range   Sodium 137 135 - 146 mmol/L   Potassium 3.3 (L) 3.5 - 5.3 mmol/L   Chloride 95 (L) 98 - 110 mmol/L   CO2 29 20 - 31 mmol/L   Glucose, Bld 147 (H) 65 - 99 mg/dL   BUN 19 7 - 25 mg/dL   Creat 1.06 0.70 - 1.11 mg/dL    Comment:   For patients > or = 81 years of age: The upper reference limit for Creatinine is approximately 13% higher for people identified as African-American.      Total Bilirubin 0.6 0.2 - 1.2 mg/dL   Alkaline Phosphatase 81 40 - 115 U/L   AST 16 10 - 35 U/L   ALT 29 9 - 46 U/L   Total Protein 4.7 (L) 6.1 - 8.1 g/dL   Albumin 3.0 (L) 3.6 - 5.1 g/dL   Calcium 9.0 8.6 - 10.3 mg/dL   GFR, Est African American 75 >=60 mL/min   GFR, Est Non African American 65  >=60 mL/min     ASSESSMENT/PLAN:   See printed instructions  Overall stable, low blood pressure is a concern. Probably overmedication with Lasix plus metoprolol.  Chronic systolic congestive heart failure (  [place]) - Plan: metoprolol succinate (TOPROL-XL) 25 MG 24 hr tablet, isosorbide mononitrate (IMDUR) 30 MG 24 hr tablet, Ambulatory referral to [place], CBC, COMPLETE METABOLIC PANEL WITH GFR  Coronary artery disease involving native coronary artery of native heart without angina pectoris - Plan: Ambulatory referral to [place]  COPD mixed type ([place]) - Plan: predniSONE (DELTASONE) 5 MG tablet, Ambulatory referral to [facility]  Swelling of both lower extremities - Plan: furosemide (LASIX) 20 MG tablet  Debility - Plan: AMBULATORY NON FORMULARY MEDICATION  Hypokalemia - Had normalized by time of discharge, has been off potassium, we'll recheck levels.  Hypoalbuminemia  Hypokalemia  Anemia, unspecified type    Patient Instructions  PLAN  Decrease Lasix/Furosemide to 20 mg once per day  If weight gain of 3 pounds in 1 day or 5 pounds in 2 days, increase Lasix to 40 mg twice per day and call Dr. Sheppard Coil  If top number on blood pressure drops below 100 or bottom number drops below 60, he needs to be seen in a medical office or emergency department, especially if he is having other symptoms of weakness or shaking.  Decrease metoprolol from 25 mg to 12.5 mg by taking half tablet daily  We will recheck his potassium levels and determine if he continues to need supplementation on this. If so, I will send to the pharmacy   We are getting other labs today to make sure kidney function and other basic organ parameters are okay on current medications  Would recommend follow-up with cardiologist to determine any further recommendations regarding...  Appropriate medication management for heart failure  (such as alternatives to Lasix, metoprolol)  What additional tests or procedures he may need  We'll see if we can get home health out to help with physical therapy/rehabilitation  They should also be able to get commode chair taken care of, we will fax them a prescription for this  Would recommend checking with GI regarding the swallowing issue - I will have home health try to do another swallow study in the meantime. We will follow up on this next week   Questions - call me!      Visit summary with medication list and pertinent instructions was printed for patient to review. All questions at time of visit were answered - patient instructed to contact office with any additional concerns. ER/RTC precautions were reviewed with the patient. Follow-up plan: Return in about 5 days (around 04/30/2017) for recheck blood pressure, go over lab results .  Note: Total time spent 40 minutes, greater than 50% of the visit was spent face-to-face counseling and coordinating care for the following: The primary encounter diagnosis was Chronic systolic congestive heart failure ([place]). Diagnoses of Coronary artery disease involving native coronary artery of native heart without angina pectoris, COPD mixed type (H[facility]), Swelling of both lower extremities, Debility, and Hypokalemia were also pertinent to this visit..   ADDENDUM: D/C SUMMARY - hospital course  1. Dyspnea on exertion, right upper lobe pneumonia vs. COPD with exacerbation: Suspected to be multifactorial. Patient had been treated with Levaquin and Augmentin prior to admission. Pulmonology was consulted on admission noting they would consider repeat bronch once stable from cardiology perspective. Patient was placed on steroids, nebulizer treatments, oxygen supplementation and Zosyn. Sputum cultures 6/26 showing many WBC, moderate epithelial, few gram + cocci, few gram - bacilli, rare gram negative diplococci. MRSA negative 6/26. Patient improved with  medications (steroids, nebs, antibiotics) and  pulmonology noted on 6/29 no need for AVAPS at this time. May need future bronch but not necessary now with patient improvement. Steroids changed to q12 hours and zosyn discontinued and Augmentin x3 days was started to give a total of 7 days of inpatient antibiotics. Peripheral edema resolved and lasix was discontinued on 6/29 but Diamox was started due to increasing CO2.   Another contribution to patient's shortness of breath arose from bronchiectasis. Patient has significant esophageal dysfunction seen on EGD. Esophageal dysfunction and possible penetration into the airway cannot be exclusively ruled out. As a result, patient was instructed to pursue careful chewing and small bites. Family was very knowledgeable of potential risk of aspiration and proceeded to alter textures as needed  2. Systolic Congestive Heart Failure: Recent ECHO on 03/26/17 demonstrated LV global hypokinesis, proximal septal severe hypokinesis, posterior and inferobasal hypokinesis, LVEF 40-45%, aortic valve sclerosis and 1+ TR. Troponin <0.010, <0.010, 0.011, 0.012 (6/26). Cardiology was consulted due to ECHO findings. They noted not opting to pursue ischemic workup due to patient not complaining of chest pain, worsening dementia and worried about further workup not improving quality of life for patient. They suggested medical management of Imdur 15mg  PO daily, toprol 25mg  PO daily, and aspirin.  3. Hypokalemia: Patient had low potassium throughout hospital stay. Potassium was repleted.  4. Leukocytosis: WBC count elevated on admission which was representative of steroid-induced leukocytosis coupled with bronchiectasis . Patient was given Augmentin 875 mg twice a day to complete a 10 day course   5. Balanitis, urinary retention with history of BPH: Patient had flomax continued and lotrimin cream BID.   6. Abdominal Pain/Distention: CT abdomen performed 04/09/17 with no acute findings,  diverticulosis, prostatomegaly, small bilateral renal cysts, CAD, right common iliac artery aneurysm. Patient was started on bowel regimen and resolution of his symptoms after bowel movement.  7. Dysphagia: Due to concern for aspiration, speech was consulted. They had not diet restrictions and left him on regular diet with thin liquids. They did suggest barium swallow which was performed on 04/11/17 with no definite stricture/hernia but had motility disorder with barium tablet impaction in lower thoracic esophagus. Chest xray was taken after procedure demonstrating No acute abnormality; Barium tablet which was lodged in the esophagus on same day barium swallow is no longer seen. Due to difficulty swallowing pills, and episode of barium tablet becoming stuck in esophagus, GI was consulted 6/29. They noted possible motility disorder vs. Stricture and patient was scheduled for EGD on 6/30 which showed esophageal candidiasis that will be treated for 10 days with Diflucan

## 2017-04-26 ENCOUNTER — Inpatient Hospital Stay: Payer: Medicare Other | Admitting: Osteopathic Medicine

## 2017-04-26 LAB — COMPLETE METABOLIC PANEL WITH GFR
ALT: 29 U/L (ref 9–46)
AST: 16 U/L (ref 10–35)
Albumin: 3 g/dL — ABNORMAL LOW (ref 3.6–5.1)
Alkaline Phosphatase: 81 U/L (ref 40–115)
BUN: 19 mg/dL (ref 7–25)
CO2: 29 mmol/L (ref 20–31)
Calcium: 9 mg/dL (ref 8.6–10.3)
Chloride: 95 mmol/L — ABNORMAL LOW (ref 98–110)
Creat: 1.06 mg/dL (ref 0.70–1.11)
GFR, EST AFRICAN AMERICAN: 75 mL/min (ref >=60)
GFR, EST NON AFRICAN AMERICAN: 65 mL/min (ref >=60)
GLUCOSE: 147 mg/dL — AB (ref 65–99)
POTASSIUM: 3.3 mmol/L — AB (ref 3.5–5.3)
SODIUM: 137 mmol/L (ref 135–146)
Total Bilirubin: 0.6 mg/dL (ref 0.2–1.2)
Total Protein: 4.7 g/dL — ABNORMAL LOW (ref 6.1–8.1)

## 2017-04-26 LAB — CBC
HCT: 39.2 % (ref 38.5–50.0)
Hemoglobin: 12.8 g/dL — ABNORMAL LOW (ref 13.2–17.1)
MCH: 32.7 pg (ref 27.0–33.0)
MCHC: 32.7 g/dL (ref 32.0–36.0)
MCV: 100 fL (ref 80.0–100.0)
MPV: 10.3 fL (ref 7.5–12.5)
PLATELETS: 161 10*3/uL (ref 140–400)
RBC: 3.92 MIL/uL — ABNORMAL LOW (ref 4.20–5.80)
RDW: 15.1 % — AB (ref 11.0–15.0)
WBC: 12.4 10*3/uL — ABNORMAL HIGH (ref 3.8–10.8)

## 2017-04-26 MED ORDER — POTASSIUM CHLORIDE POWD: 20.0000 meq | Freq: Every day | 3 refills | Status: DC

## 2017-04-26 NOTE — Addendum Note (Signed)
Addended by: Maryla Morrow on: 04/26/2017 12:27 PM   Modules accepted: Orders

## 2017-04-29 ENCOUNTER — Telehealth: Payer: Self-pay

## 2017-04-29 MED ORDER — POTASSIUM CHLORIDE 20 MEQ PO PACK: 20.0000 meq | PACK | Freq: Every day | ORAL | 5 refills | Status: DC

## 2017-04-29 NOTE — Telephone Encounter (Signed)
Information discussed with pts daughter.

## 2017-04-29 NOTE — Telephone Encounter (Signed)
Pts wife states that she was advised by the pharmacy that they are unsure of how the pt is to receive potassium chloride powder. She also states that she does not want to recievea large bottle of powder which is how the pharmacy told her it would be dispensed. She would prefer packets if possible. Advised that Emeterio Reeve, DO would be contacted to see if it can be written another way. Please advise.

## 2017-04-29 NOTE — Telephone Encounter (Signed)
Alternative packets sent to pharmacy.

## 2017-04-30 ENCOUNTER — Ambulatory Visit (INDEPENDENT_AMBULATORY_CARE_PROVIDER_SITE_OTHER): Payer: Medicare Other | Admitting: Osteopathic Medicine

## 2017-04-30 ENCOUNTER — Encounter: Payer: Self-pay | Admitting: Osteopathic Medicine

## 2017-04-30 VITALS — BP 130/67 | HR 86 | Wt 140.0 lb

## 2017-04-30 DIAGNOSIS — I2510 Atherosclerotic heart disease of native coronary artery without angina pectoris: Secondary | ICD-10-CM

## 2017-04-30 DIAGNOSIS — I5022 Chronic systolic (congestive) heart failure: Secondary | ICD-10-CM

## 2017-04-30 DIAGNOSIS — R4189 Other symptoms and signs involving cognitive functions and awareness: Secondary | ICD-10-CM | POA: Diagnosis not present

## 2017-04-30 MED ORDER — METOPROLOL SUCCINATE ER 25 MG PO TB24: 25.0000 mg | ORAL_TABLET | Freq: Every day | ORAL | 1 refills | Status: DC

## 2017-04-30 NOTE — Progress Notes (Signed)
HPI: Bill Hinton is a 83 y.o. male  who presents to [place] today, 04/30/17,  for chief complaint of:  Chief Complaint  Patient presents with  . Follow-up    discuss labs    Follow-up CHF: No concerns with swelling, no chest pain. He is still fatigued from his hospitalization but is no set up with home physical therapy.  Hypokalemia: Labs still indicated some electrolyte imbalance. Plan to recheck.  Family still has some concerns about cognitive issues, memory problems. He had apparently been seeing a neurologist but they wanted to get a second opinion regarding possible dementia.  Past medical history, surgical history, social history and family history reviewed.  Patient Active Problem List   Diagnosis Date Noted  . Benign prostatic hyperplasia 12/14/2016  . CAD (coronary artery disease), native coronary artery 12/14/2016  . Depression with anxiety 12/14/2016  . History of nephrolithiasis 12/14/2016  . Pneumothorax 12/14/2016  . Tinnitus 12/14/2016  . Vitamin D deficiency 12/14/2016  . Rhinorrhea 12/14/2016  . Declining functional status 09/26/2016  . Pneumonia of both lungs due to infectious organism 09/19/2016  . Restless leg syndrome 09/19/2016  . Acute respiratory failure with hypoxia ([place]) 09/17/2016  . Bell's palsy 09/17/2016  . Bronchiectasis ([place]) 09/17/2016  . 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 Dysphagia 08/17/2016  . Vasomotor rhinitis 08/17/2016  . Right ureteral calculus 04/13/2015  . Atherosclerotic heart disease of native coronary artery without angina pectoris 04/11/2015  . Chronic kidney disease 04/11/2015  . Diaphragmatic hernia without obstruction or gangrene 04/11/2015  . Enlarged prostate without lower urinary tract symptoms (luts) 04/11/2015  . Essential (primary) hypertension 04/11/2015  . Nocturia 04/11/2015  . Other seasonal allergic rhinitis 04/11/2015  . Panic disorder  without agoraphobia 04/11/2015  . Status post cataract extraction 01/21/2014  . Presence of intraocular lens 01/21/2014  . Pseudophakia of both eyes 01/21/2014  . Nuclear sclerosis of right eye 01/19/2014  . 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 Pterygium of left eye 05/29/2012  . HLD (hyperlipidemia) 12/30/2008  . 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 Gastroesophageal reflux disease without esophagitis 12/30/2008    Current medication list and allergy/intolerance information reviewed.   Current Outpatient Prescriptions on File Prior to Visit  Medication Sig Dispense Refill  . AMBULATORY NON FORMULARY MEDICATION Disp commode chair Dx Debility and ambulatory dysfunction, CHF, COPD 1 Units prn  . aspirin 81 MG chewable tablet Chew by mouth.    . citalopram (CELEXA) 40 MG tablet Take 40 mg by mouth daily.    . clotrimazole (LOTRIMIN) 1 % cream Apply topically.    . donepezil (ARICEPT) 10 MG tablet Take 10 mg by mouth daily.    . furosemide (LASIX) 20 MG tablet Take 1 tablet (20 mg total) by mouth daily. 90 tablet 1  . ipratropium-albuterol (DUONEB) 0.5-2.5 (3) MG/3ML SOLN Inhale into the lungs.    . isosorbide mononitrate (IMDUR) 30 MG 24 hr tablet Take by mouth.    . lansoprazole (PREVACID) 30 MG capsule Take 1 capsule (30 mg total) by mouth daily. 90 capsule 3  . montelukast (SINGULAIR) 10 MG tablet Take 10 mg by mouth daily.  11  . potassium chloride (KLOR-CON) 20 MEQ packet Take 20 mEq by mouth daily. 50 packet 5  . predniSONE (DELTASONE) 5 MG tablet Take 56m daily.  Reduce by one tablet every FOUR days until taking 176m(THREE TABLETS) daily    .  rOPINIRole (REQUIP) 0.5 MG tablet Take 0.5 mg by mouth daily.  11  . rosuvastatin (CRESTOR) 5 MG tablet Take 1 tablet (5 mg total) by mouth at bedtime. 90 tablet 3  . tamsulosin (FLOMAX) 0.4 MG CAPS capsule Take by mouth.     No current  facility-administered medications on file prior to visit.    Allergies  Allergen Reactions  . Hydromorphone Hcl       Review of Systems:  Cardiac: No  chest pain, No  pressure, No palpitations  Respiratory:  No  shortness of breath. No  Cough  Gastrointestinal: No  abdominal pain, no change on bowel habits  Musculoskeletal: No new myalgia/arthralgia  Skin: No  Rash  Neurologic: No focal  weakness, No  Dizziness   Exam:  BP 130/67   Pulse 86   Wt 140 lb (63.5 kg)   SpO2 94%   BMI 19.53 kg/m   Constitutional: VS see above. General Appearance: alert, well-developed, well-nourished, NAD  Eyes: Normal lids and conjunctive, non-icteric sclera  Ears, Nose, Mouth, Throat: MMM, Normal external inspection ears/nares/mouth/lips/gums.  Neck: No masses, trachea midline.   Respiratory: Normal respiratory effort. no wheeze, no rhonchi, no rales. Diminished breath sounds bilaterally  Cardiovascular: S1/S2 normal, no murmur, no rub/gallop auscultated. RRR.   Musculoskeletal: Symmetric and independent movement of all extremities  Neurological: Normal balance/coordination. No tremor.  Skin: warm, dry, intact.   Psychiatric: Normal judgment/insight. Normal mood and affect. Oriented x3.    Recent Results (from the past 2160 hour(s))  CBC with Differential/Platelet     Status: Abnormal   Collection Time: 03/08/17  9:24 AM  Result Value Ref Range   WBC 14.8 (H) 3.8 - 10.8 K/uL   RBC 4.55 4.20 - 5.80 MIL/uL   Hemoglobin 14.1 13.2 - 17.1 g/dL   HCT 43.6 38.5 - 50.0 %   MCV 95.8 80.0 - 100.0 fL   MCH 31.0 27.0 - 33.0 pg   MCHC 32.3 32.0 - 36.0 g/dL   RDW 14.7 11.0 - 15.0 %   Platelets 196 140 - 400 K/uL   MPV 10.7 7.5 - 12.5 fL   Neutro Abs 12,284 (H) 1,500 - 7,800 cells/uL   Lymphs Abs 1,332 850 - 3,900 cells/uL   Monocytes Absolute 888 200 - 950 cells/uL   Eosinophils Absolute 296 15 - 500 cells/uL   Basophils Absolute 0 0 - 200 cells/uL   Neutrophils Relative % 83 %    Lymphocytes Relative 9 %   Monocytes Relative 6 %   Eosinophils Relative 2 %   Basophils Relative 0 %   Smear Review Criteria for review not met   COMPLETE METABOLIC PANEL WITH GFR     Status: Abnormal   Collection Time: 03/08/17  9:24 AM  Result Value Ref Range   Sodium 142 135 - 146 mmol/L   Potassium 3.6 3.5 - 5.3 mmol/L   Chloride 103 98 - 110 mmol/L   CO2 28 20 - 31 mmol/L   Glucose, Bld 89 65 - 99 mg/dL   BUN 11 7 - 25 mg/dL   Creat 0.91 0.70 - 1.11 mg/dL    Comment:   For patients > or = 81 years of age: The upper reference limit for Creatinine is approximately 13% higher for people identified as African-American.      Total Bilirubin 0.9 0.2 - 1.2 mg/dL   Alkaline Phosphatase 101 40 - 115 U/L   AST 13 10 - 35 U/L   ALT 20 9 - 46  U/L   Total Protein 5.2 (L) 6.1 - 8.1 g/dL   Albumin 2.8 (L) 3.6 - 5.1 g/dL   Calcium 9.0 8.6 - 10.3 mg/dL   GFR, Est African American >89 >=60 mL/min   GFR, Est Non African American 78 >=60 mL/min  TSH     Status: None   Collection Time: 03/08/17  9:24 AM  Result Value Ref Range   TSH 1.62 0.40 - 4.50 mIU/L  T4, free     Status: None   Collection Time: 03/08/17  9:24 AM  Result Value Ref Range   Free T4 1.1 0.8 - 1.8 ng/dL  Hemoglobin A1c     Status: Abnormal   Collection Time: 03/08/17  9:24 AM  Result Value Ref Range   Hgb A1c MFr Bld 6.5 (H) <5.7 %    Comment:   For someone without known diabetes, a hemoglobin A1c value of 6.5% or greater indicates that they may have diabetes and this should be confirmed with a follow-up test.   For someone with known diabetes, a value <7% indicates that their diabetes is well controlled and a value greater than or equal to 7% indicates suboptimal control. A1c targets should be individualized based on duration of diabetes, age, comorbid conditions, and other considerations.   Currently, no consensus exists for use of hemoglobin A1c for diagnosis of diabetes for children.      Mean Plasma  Glucose 140 mg/dL  VITAMIN D 25 Hydroxy (Vit-D Deficiency, Fractures)     Status: None   Collection Time: 03/08/17  9:24 AM  Result Value Ref Range   Vit D, 25-Hydroxy 33 30 - 100 ng/mL    Comment: Vitamin D Status           25-OH Vitamin D        Deficiency                <20 ng/mL        Insufficiency         20 - 29 ng/mL        Optimal             > or = 30 ng/mL   For 25-OH Vitamin D testing on patients on D2-supplementation and patients for whom quantitation of D2 and D3 fractions is required, the [facility] 25-OH VIT D, (D2,D3), LC/MS/MS is recommended: order code [id number] (patients > 2 yrs).   Urine Microscopic     Status: None   Collection Time: 03/08/17  9:24 AM  Result Value Ref Range   WBC, UA 0-5 <=5 WBC/HPF   RBC / HPF 0-2 <=2 RBC/HPF   Squamous Epithelial / LPF NONE SEEN <=5 HPF   Bacteria, UA NONE SEEN NONE SEEN HPF   Crystals NONE SEEN NONE SEEN HPF   Casts NONE SEEN NONE SEEN LPF   Yeast NONE SEEN NONE SEEN HPF  CBC with Differential/Platelet     Status: Abnormal   Collection Time: 03/13/17 11:12 AM  Result Value Ref Range   WBC 13.7 (H) 3.8 - 10.8 K/uL   RBC 4.35 4.20 - 5.80 MIL/uL   Hemoglobin 13.7 13.2 - 17.1 g/dL   HCT 42.0 38.5 - 50.0 %   MCV 96.6 80.0 - 100.0 fL   MCH 31.5 27.0 - 33.0 pg   MCHC 32.6 32.0 - 36.0 g/dL   RDW 14.8 11.0 - 15.0 %   Platelets 204 140 - 400 K/uL   MPV 10.1 7.5 - 12.5 fL   Neutro Abs  10,960 (H) 1,500 - 7,800 cells/uL   Lymphs Abs 1,781 850 - 3,900 cells/uL   Monocytes Absolute 685 200 - 950 cells/uL   Eosinophils Absolute 274 15 - 500 cells/uL   Basophils Absolute 0 0 - 200 cells/uL   Neutrophils Relative % 80 %   Lymphocytes Relative 13 %   Monocytes Relative 5 %   Eosinophils Relative 2 %   Basophils Relative 0 %   Smear Review Criteria for review not met   Pathologist smear review     Status: None   Collection Time: 03/14/17  9:55 AM  Result Value Ref Range   Path Review SEE NOTE     Comment: Leukocytosis due to  absolute granulocytosis. Myeloid population consists predominantly of mature segmented neutrophils with reactive changes. No immature cells are identified.  Platelets are unremarkable. A few Elliptocytes and slight polychromasia noted in RBCs. Reviewed by Francis Gaines Mammarappallil MD (Electronic Signature on File) 03/15/2017.   COMPLETE METABOLIC PANEL WITH GFR     Status: Abnormal   Collection Time: 04/08/17 12:29 PM  Result Value Ref Range   Sodium 143 135 - 146 mmol/L   Potassium 3.6 3.5 - 5.3 mmol/L   Chloride 106 98 - 110 mmol/L   CO2 28 20 - 31 mmol/L   Glucose, Bld 77 65 - 99 mg/dL   BUN 14 7 - 25 mg/dL   Creat 0.97 0.70 - 1.11 mg/dL    Comment:   For patients > or = 81 years of age: The upper reference limit for Creatinine is approximately 13% higher for people identified as African-American.      Total Bilirubin 1.3 (H) 0.2 - 1.2 mg/dL   Alkaline Phosphatase 80 40 - 115 U/L   AST 20 10 - 35 U/L   ALT 24 9 - 46 U/L   Total Protein 5.0 (L) 6.1 - 8.1 g/dL   Albumin 3.1 (L) 3.6 - 5.1 g/dL   Calcium 8.9 8.6 - 10.3 mg/dL   GFR, Est African American 83 >=60 mL/min   GFR, Est Non African American 72 >=60 mL/min  Lipid panel     Status: None   Collection Time: 04/08/17 12:29 PM  Result Value Ref Range   Cholesterol 177 <200 mg/dL   Triglycerides 90 <150 mg/dL   HDL 105 >40 mg/dL   Total CHOL/HDL Ratio 1.7 <5.0 Ratio   VLDL 18 <30 mg/dL   LDL Cholesterol 54 <100 mg/dL  Microalbumin / creatinine urine ratio     Status: None   Collection Time: 04/08/17 12:29 PM  Result Value Ref Range   Creatinine, Urine 120 20 - 370 mg/dL   Microalb, Ur 3.0 Not estab mg/dL   Microalb Creat Ratio 25 <30 mcg/mg creat    Comment: The ADA has defined abnormalities in albumin excretion as follows:           Category           Result                            (mcg/mg creatinine)                 Normal:    <30       Microalbuminuria:    30 - 299   Clinical albuminuria:    > or = 300    The ADA recommends that at least two of three specimens collected within a 3 - 6 month period  be abnormal before considering a patient to be within a diagnostic category.     Urine Microscopic     Status: Abnormal   Collection Time: 04/08/17 12:29 PM  Result Value Ref Range   WBC, UA 0-5 <=5 WBC/HPF   RBC / HPF 0-2 <=2 RBC/HPF   Squamous Epithelial / LPF NONE SEEN <=5 HPF   Bacteria, UA NONE SEEN NONE SEEN HPF   Crystals See Below (A) NONE SEEN HPF    Comment: Calcium Oxalate Crystals             FEW       ABN   NONE SEEN     HPF   Casts NONE SEEN NONE SEEN LPF   Yeast NONE SEEN NONE SEEN HPF  CP4508-PT/INR AND PTT     Status: None   Collection Time: 04/08/17 12:29 PM  Result Value Ref Range   Prothrombin Time 10.7 9.0 - 11.5 sec    Comment:   For more information on this test, go to: http://education.quest[facility].com/faq/FAQ104      INR 1.0     Comment:   Reference Range                        0.9-1.1 Moderate-intensity Warfarin Therapy    2.0-3.0 Higher-intensity Warfarin Therapy      3.0-4.0      aPTT 27 22 - 34 sec    Comment:   This test has not been validated for monitoring unfractionated heparin therapy. For testing that is validated for this type of therapy, please refer to the Heparin Anti-Xa assay (test code [id number]).   For additional information, please refer to http://education.[facility].com/faq/FAQ159 (This link is being provided for informational/educational purposes only.)     CBC     Status: Abnormal   Collection Time: 04/25/17  3:28 PM  Result Value Ref Range   WBC 12.4 (H) 3.8 - 10.8 K/uL   RBC 3.92 (L) 4.20 - 5.80 MIL/uL   Hemoglobin 12.8 (L) 13.2 - 17.1 g/dL   HCT 39.2 38.5 - 50.0 %   MCV 100.0 80.0 - 100.0 fL   MCH 32.7 27.0 - 33.0 pg   MCHC 32.7 32.0 - 36.0 g/dL   RDW 15.1 (H) 11.0 - 15.0 %   Platelets 161 140 - 400 K/uL   MPV 10.3 7.5 - 12.5 fL  COMPLETE METABOLIC PANEL WITH GFR     Status: Abnormal   Collection Time:  04/25/17  3:28 PM  Result Value Ref Range   Sodium 137 135 - 146 mmol/L   Potassium 3.3 (L) 3.5 - 5.3 mmol/L   Chloride 95 (L) 98 - 110 mmol/L   CO2 29 20 - 31 mmol/L   Glucose, Bld 147 (H) 65 - 99 mg/dL   BUN 19 7 - 25 mg/dL   Creat 1.06 0.70 - 1.11 mg/dL    Comment:   For patients > or = 81 years of age: The upper reference limit for Creatinine is approximately 13% higher for people identified as African-American.      Total Bilirubin 0.6 0.2 - 1.2 mg/dL   Alkaline Phosphatase 81 40 - 115 U/L   AST 16 10 - 35 U/L   ALT 29 9 - 46 U/L   Total Protein 4.7 (L) 6.1 - 8.1 g/dL   Albumin 3.0 (L) 3.6 - 5.1 g/dL   Calcium 9.0 8.6 - 10.3 mg/dL   GFR, Est African American 75 >=60 mL/min   GFR, Est  Non African American 65 >=60 mL/min    No results found.  No flowsheet data found.  Depression screen PHQ 2/9 04/08/2017  Decreased Interest 0  Down, Depressed, Hopeless 1  PHQ - 2 Score 1      ASSESSMENT/PLAN:   Chronic systolic congestive heart failure (HCC) - Plan: CMP and Liver, DISCONTINUED: metoprolol succinate (TOPROL-XL) 25 MG 24 hr tablet  Cognitive decline - Plan: Ambulatory referral to Neurology    Patient Instructions  Plan: Continue current medications Continue checking weights as directed Will plan to recheck labs in 4-6 weeks and come see me a few days after that to go over results.  Come see me sooner if needed!     Follow-up plan: Return in about 6 weeks (around 06/11/2017) for recheck labs and blood pressure, sooner if needed .  Visit summary with medication list and pertinent instructions was printed for patient to review, alert [place] if any changes needed. All questions at time of visit were answered - patient instructed to contact office with any additional concerns. ER/RTC precautions were reviewed with the patient and understanding verbalized.

## 2017-04-30 NOTE — Patient Instructions (Signed)
Plan: Continue current medications Continue checking weights as directed Will plan to recheck labs in 4-6 weeks and come see me a few days after that to go over results.  Come see me sooner if needed!

## 2017-05-01 ENCOUNTER — Ambulatory Visit (INDEPENDENT_AMBULATORY_CARE_PROVIDER_SITE_OTHER): Payer: Medicare Other | Admitting: Osteopathic Medicine

## 2017-05-01 ENCOUNTER — Encounter: Payer: Self-pay | Admitting: Osteopathic Medicine

## 2017-05-01 VITALS — BP 109/45 | HR 91 | Temp 98.1°F | Wt 140.0 lb

## 2017-05-01 DIAGNOSIS — I2510 Atherosclerotic heart disease of native coronary artery without angina pectoris: Secondary | ICD-10-CM

## 2017-05-01 DIAGNOSIS — L03114 Cellulitis of left upper limb: Secondary | ICD-10-CM

## 2017-05-01 MED ORDER — CLINDAMYCIN HCL 300 MG PO CAPS: 300.0000 mg | ORAL_CAPSULE | Freq: Three times a day (TID) | ORAL | 0 refills | Status: DC

## 2017-05-01 NOTE — Progress Notes (Signed)
HPI: Bill Hinton is a 83 y.o. male  who presents to [place] today, 05/01/17,  for chief complaint of:  Chief Complaint  Patient presents with  . Arm Pain    L arm    . Rash is new today along underside of left arm, skin is tender and so are muscle but he hurts with lifting from physical therapy. Small scratch on elbow, not sure when it happened.    Past medical history, surgical history, social history and family history reviewed.  Patient Active Problem List   Diagnosis Date Noted  . Benign prostatic hyperplasia 12/14/2016  . CAD (coronary artery disease), native coronary artery 12/14/2016  . Depression with anxiety 12/14/2016  . History of nephrolithiasis 12/14/2016  . Pneumothorax 12/14/2016  . Tinnitus 12/14/2016  . Vitamin D deficiency 12/14/2016  . Rhinorrhea 12/14/2016  . Declining functional status 09/26/2016  . Pneumonia of both lungs due to infectious organism 09/19/2016  . Restless leg syndrome 09/19/2016  . Acute respiratory failure with hypoxia ([place]) 09/17/2016  . Bell's palsy 09/17/2016  . Bronchiectasis ([place]) 09/17/2016  . 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 Dysphagia 08/17/2016  . Vasomotor rhinitis 08/17/2016  . Right ureteral calculus 04/13/2015  . Atherosclerotic heart disease of native coronary artery without angina pectoris 04/11/2015  . Chronic kidney disease 04/11/2015  . Diaphragmatic hernia without obstruction or gangrene 04/11/2015  . Enlarged prostate without lower urinary tract symptoms (luts) 04/11/2015  . Essential (primary) hypertension 04/11/2015  . Nocturia 04/11/2015  . Other seasonal allergic rhinitis 04/11/2015  . Panic disorder without agoraphobia 04/11/2015  . Status post cataract extraction 01/21/2014  . Presence of intraocular lens 01/21/2014  . Pseudophakia of both eyes 01/21/2014  . Nuclear sclerosis of right eye 01/19/2014  . Nodular corneal  degeneration 11/19/2013  . 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 Pterygium of left eye 05/29/2012  . HLD (hyperlipidemia) 12/30/2008  . 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 Gastroesophageal reflux disease without esophagitis 12/30/2008    Current medication list and allergy/intolerance information reviewed.   Current Outpatient Prescriptions on File Prior to Visit  Medication Sig Dispense Refill  . AMBULATORY NON FORMULARY MEDICATION Disp commode chair Dx Debility and ambulatory dysfunction, CHF, COPD 1 Units prn  . aspirin 81 MG chewable tablet Chew by mouth.    . Cholecalciferol (VITAMIN D-3) 1000 units CAPS Take 1 capsule by mouth daily.    . citalopram (CELEXA) 40 MG tablet Take 40 mg by mouth daily.    . clotrimazole (LOTRIMIN) 1 % cream Apply topically.    . donepezil (ARICEPT) 10 MG tablet Take 10 mg by mouth daily.    . furosemide (LASIX) 20 MG tablet Take 1 tablet (20 mg total) by mouth daily. 90 tablet 1  . ipratropium-albuterol (DUONEB) 0.5-2.5 (3) MG/3ML SOLN Inhale into the lungs.    . isosorbide mononitrate (IMDUR) 30 MG 24 hr tablet Take by mouth.    . lansoprazole (PREVACID) 30 MG capsule Take 1 capsule (30 mg total) by mouth daily. 90 capsule 3  . metoprolol succinate (TOPROL-XL) 25 MG 24 hr tablet Take 1 tablet (25 mg total) by mouth daily. 90 tablet 1  . montelukast (SINGULAIR) 10 MG tablet Take 10 mg by mouth daily.  11  . potassium chloride (KLOR-CON) 20 MEQ packet Take 20 mEq by mouth daily. 50 packet 5  . predniSONE (DELTASONE) 5 MG tablet Take 40mg  daily.  Reduce by one tablet every FOUR days  until taking 15mg  (THREE TABLETS) daily    . rOPINIRole (REQUIP) 0.5 MG tablet Take 0.5 mg by mouth daily.  11  . rosuvastatin (CRESTOR) 5 MG tablet Take 1 tablet (5 mg total) by mouth at bedtime. 90 tablet 3  . tamsulosin (FLOMAX) 0.4 MG CAPS capsule Take by mouth.     No current facility-administered medications on file prior to  visit.    Allergies  Allergen Reactions  . Hydromorphone Hcl       Review of Systems:  Constitutional: No fever/chills, no increased fatigue/weakness  Cardiac: No  chest pain, No  pressure, No palpitations  Respiratory:  No  shortness of breath. No  Cough  Skin: +Rash  Neurologic: No  Dizziness   Exam:  BP (!) 109/45   Pulse 91   Temp 98.1 F (36.7 C)   Wt 140 lb (63.5 kg)   SpO2 94%   BMI 19.53 kg/m   Constitutional: VS see above. General Appearance: alert, well-developed, well-nourished, NAD  Respiratory: Normal respiratory effort.  Musculoskeletal:  Symmetric and independent movement of all extremities  Skin: warm, dry. Erythematous on posterior L arm, mild edema near L elbow, small scrapes on elbow, one healing, one a bit fresher but no purulent drainage or palpable abscess, red area is bit tinder            ASSESSMENT/PLAN:   Cellulitis - family would like to avoid hospital if possible, will trial po abx and I drew border around rash, if extends and/or if fever recommend go to ER.   Neglected to recheck BP, several monitors have been off today and I called pt's wife to confirm that he has no dizziness or lightheadedness, SOB, fatigue. He feels fine except for rash. Will bring back tomorrow to check rash and BP.   Cellulitis of left upper extremity - Plan: clindamycin (CLEOCIN) 300 MG capsule     After visit: Called daughter - I neglected to recheck his BP prior to leaving the office, we need home health to recheck it and/or I'll see him tomorrow to follow up on rash. Called wife - cell phone was answered and hung up, eventually reached her on house phone. Will bring pt back tomorrow for recheck rash and BP.    Follow-up plan: Return for recheck skin 1-2 days, sooner if needed.  Visit summary with medication list and pertinent instructions was printed for patient to review, alert [place] if any changes needed. All questions at time of visit were answered -  patient instructed to contact office with any additional concerns. ER/RTC precautions were reviewed with the patient and understanding verbalized.

## 2017-05-01 NOTE — Patient Instructions (Signed)

## 2017-05-02 ENCOUNTER — Telehealth: Payer: Self-pay | Admitting: Osteopathic Medicine

## 2017-05-02 ENCOUNTER — Ambulatory Visit: Payer: Medicare Other | Admitting: Osteopathic Medicine

## 2017-05-02 DIAGNOSIS — R918 Other nonspecific abnormal finding of lung field: Secondary | ICD-10-CM | POA: Diagnosis not present

## 2017-05-02 DIAGNOSIS — A419 Sepsis, unspecified organism: Secondary | ICD-10-CM | POA: Diagnosis present

## 2017-05-02 DIAGNOSIS — F329 Major depressive disorder, single episode, unspecified: Secondary | ICD-10-CM | POA: Diagnosis present

## 2017-05-02 DIAGNOSIS — D696 Thrombocytopenia, unspecified: Secondary | ICD-10-CM | POA: Diagnosis present

## 2017-05-02 DIAGNOSIS — Z9049 Acquired absence of other specified parts of digestive tract: Secondary | ICD-10-CM | POA: Diagnosis not present

## 2017-05-02 DIAGNOSIS — J690 Pneumonitis due to inhalation of food and vomit: Secondary | ICD-10-CM | POA: Diagnosis not present

## 2017-05-02 DIAGNOSIS — K5900 Constipation, unspecified: Secondary | ICD-10-CM | POA: Diagnosis not present

## 2017-05-02 DIAGNOSIS — J984 Other disorders of lung: Secondary | ICD-10-CM | POA: Diagnosis not present

## 2017-05-02 DIAGNOSIS — Z885 Allergy status to narcotic agent status: Secondary | ICD-10-CM | POA: Diagnosis not present

## 2017-05-02 DIAGNOSIS — Z87891 Personal history of nicotine dependence: Secondary | ICD-10-CM | POA: Diagnosis not present

## 2017-05-02 DIAGNOSIS — K5730 Diverticulosis of large intestine without perforation or abscess without bleeding: Secondary | ICD-10-CM | POA: Diagnosis not present

## 2017-05-02 DIAGNOSIS — J441 Chronic obstructive pulmonary disease with (acute) exacerbation: Secondary | ICD-10-CM | POA: Diagnosis not present

## 2017-05-02 DIAGNOSIS — L0390 Cellulitis, unspecified: Secondary | ICD-10-CM | POA: Diagnosis not present

## 2017-05-02 DIAGNOSIS — Z7951 Long term (current) use of inhaled steroids: Secondary | ICD-10-CM | POA: Diagnosis not present

## 2017-05-02 DIAGNOSIS — K219 Gastro-esophageal reflux disease without esophagitis: Secondary | ICD-10-CM | POA: Diagnosis present

## 2017-05-02 DIAGNOSIS — G253 Myoclonus: Secondary | ICD-10-CM | POA: Diagnosis not present

## 2017-05-02 DIAGNOSIS — J189 Pneumonia, unspecified organism: Secondary | ICD-10-CM | POA: Diagnosis not present

## 2017-05-02 DIAGNOSIS — N200 Calculus of kidney: Secondary | ICD-10-CM | POA: Diagnosis not present

## 2017-05-02 DIAGNOSIS — I10 Essential (primary) hypertension: Secondary | ICD-10-CM | POA: Diagnosis not present

## 2017-05-02 DIAGNOSIS — Z79899 Other long term (current) drug therapy: Secondary | ICD-10-CM | POA: Diagnosis not present

## 2017-05-02 DIAGNOSIS — D72829 Elevated white blood cell count, unspecified: Secondary | ICD-10-CM | POA: Diagnosis not present

## 2017-05-02 DIAGNOSIS — J929 Pleural plaque without asbestos: Secondary | ICD-10-CM | POA: Diagnosis not present

## 2017-05-02 DIAGNOSIS — K5790 Diverticulosis of intestine, part unspecified, without perforation or abscess without bleeding: Secondary | ICD-10-CM | POA: Diagnosis not present

## 2017-05-02 DIAGNOSIS — I129 Hypertensive chronic kidney disease with stage 1 through stage 4 chronic kidney disease, or unspecified chronic kidney disease: Secondary | ICD-10-CM | POA: Diagnosis present

## 2017-05-02 DIAGNOSIS — J471 Bronchiectasis with (acute) exacerbation: Secondary | ICD-10-CM | POA: Diagnosis not present

## 2017-05-02 DIAGNOSIS — N183 Chronic kidney disease, stage 3 (moderate): Secondary | ICD-10-CM | POA: Diagnosis not present

## 2017-05-02 DIAGNOSIS — M7989 Other specified soft tissue disorders: Secondary | ICD-10-CM | POA: Diagnosis not present

## 2017-05-02 DIAGNOSIS — R140 Abdominal distension (gaseous): Secondary | ICD-10-CM | POA: Diagnosis not present

## 2017-05-02 DIAGNOSIS — E11628 Type 2 diabetes mellitus with other skin complications: Secondary | ICD-10-CM | POA: Diagnosis not present

## 2017-05-02 DIAGNOSIS — R109 Unspecified abdominal pain: Secondary | ICD-10-CM | POA: Diagnosis not present

## 2017-05-02 DIAGNOSIS — G2581 Restless legs syndrome: Secondary | ICD-10-CM | POA: Diagnosis present

## 2017-05-02 DIAGNOSIS — Z888 Allergy status to other drugs, medicaments and biological substances status: Secondary | ICD-10-CM | POA: Diagnosis not present

## 2017-05-02 DIAGNOSIS — Z0389 Encounter for observation for other suspected diseases and conditions ruled out: Secondary | ICD-10-CM | POA: Diagnosis not present

## 2017-05-02 DIAGNOSIS — T361X5A Adverse effect of cephalosporins and other beta-lactam antibiotics, initial encounter: Secondary | ICD-10-CM | POA: Diagnosis not present

## 2017-05-02 DIAGNOSIS — L03114 Cellulitis of left upper limb: Secondary | ICD-10-CM | POA: Diagnosis present

## 2017-05-02 DIAGNOSIS — N281 Cyst of kidney, acquired: Secondary | ICD-10-CM | POA: Diagnosis not present

## 2017-05-02 DIAGNOSIS — N400 Enlarged prostate without lower urinary tract symptoms: Secondary | ICD-10-CM | POA: Diagnosis present

## 2017-05-02 DIAGNOSIS — Z7982 Long term (current) use of aspirin: Secondary | ICD-10-CM | POA: Diagnosis not present

## 2017-05-02 DIAGNOSIS — T17320A Food in larynx causing asphyxiation, initial encounter: Secondary | ICD-10-CM | POA: Diagnosis not present

## 2017-05-02 DIAGNOSIS — J8410 Pulmonary fibrosis, unspecified: Secondary | ICD-10-CM | POA: Diagnosis not present

## 2017-05-02 DIAGNOSIS — F0390 Unspecified dementia without behavioral disturbance: Secondary | ICD-10-CM | POA: Diagnosis present

## 2017-05-02 DIAGNOSIS — R0602 Shortness of breath: Secondary | ICD-10-CM | POA: Diagnosis not present

## 2017-05-02 DIAGNOSIS — E785 Hyperlipidemia, unspecified: Secondary | ICD-10-CM | POA: Diagnosis present

## 2017-05-02 DIAGNOSIS — K224 Dyskinesia of esophagus: Secondary | ICD-10-CM | POA: Diagnosis present

## 2017-05-02 DIAGNOSIS — J479 Bronchiectasis, uncomplicated: Secondary | ICD-10-CM | POA: Diagnosis not present

## 2017-05-02 DIAGNOSIS — R1310 Dysphagia, unspecified: Secondary | ICD-10-CM | POA: Diagnosis present

## 2017-05-02 DIAGNOSIS — R1084 Generalized abdominal pain: Secondary | ICD-10-CM | POA: Diagnosis not present

## 2017-05-02 DIAGNOSIS — J439 Emphysema, unspecified: Secondary | ICD-10-CM | POA: Diagnosis not present

## 2017-05-02 DIAGNOSIS — J449 Chronic obstructive pulmonary disease, unspecified: Secondary | ICD-10-CM | POA: Diagnosis not present

## 2017-05-02 DIAGNOSIS — E876 Hypokalemia: Secondary | ICD-10-CM | POA: Diagnosis present

## 2017-05-02 NOTE — Telephone Encounter (Signed)
Patient's family member called and canceled his appointment for today adv had to take him to the emergency room for his arm got worse. Thanks

## 2017-05-02 NOTE — Telephone Encounter (Signed)
Noted. Thanks for letting me know.

## 2017-05-10 ENCOUNTER — Encounter: Payer: Self-pay | Admitting: Neurology

## 2017-05-12 DIAGNOSIS — J440 Chronic obstructive pulmonary disease with acute lower respiratory infection: Secondary | ICD-10-CM | POA: Diagnosis not present

## 2017-05-12 DIAGNOSIS — Z9981 Dependence on supplemental oxygen: Secondary | ICD-10-CM | POA: Diagnosis not present

## 2017-05-12 DIAGNOSIS — I5023 Acute on chronic systolic (congestive) heart failure: Secondary | ICD-10-CM | POA: Diagnosis not present

## 2017-05-12 DIAGNOSIS — J189 Pneumonia, unspecified organism: Secondary | ICD-10-CM | POA: Diagnosis not present

## 2017-05-12 DIAGNOSIS — R1310 Dysphagia, unspecified: Secondary | ICD-10-CM | POA: Diagnosis not present

## 2017-05-12 DIAGNOSIS — L03114 Cellulitis of left upper limb: Secondary | ICD-10-CM | POA: Diagnosis not present

## 2017-05-12 DIAGNOSIS — I130 Hypertensive heart and chronic kidney disease with heart failure and stage 1 through stage 4 chronic kidney disease, or unspecified chronic kidney disease: Secondary | ICD-10-CM | POA: Diagnosis not present

## 2017-05-12 DIAGNOSIS — F341 Dysthymic disorder: Secondary | ICD-10-CM | POA: Diagnosis not present

## 2017-05-12 DIAGNOSIS — I2510 Atherosclerotic heart disease of native coronary artery without angina pectoris: Secondary | ICD-10-CM | POA: Diagnosis not present

## 2017-05-12 DIAGNOSIS — N183 Chronic kidney disease, stage 3 (moderate): Secondary | ICD-10-CM | POA: Diagnosis not present

## 2017-05-12 DIAGNOSIS — J441 Chronic obstructive pulmonary disease with (acute) exacerbation: Secondary | ICD-10-CM | POA: Diagnosis not present

## 2017-05-12 DIAGNOSIS — K219 Gastro-esophageal reflux disease without esophagitis: Secondary | ICD-10-CM | POA: Diagnosis not present

## 2017-05-12 DIAGNOSIS — K222 Esophageal obstruction: Secondary | ICD-10-CM | POA: Diagnosis not present

## 2017-05-15 DIAGNOSIS — J440 Chronic obstructive pulmonary disease with acute lower respiratory infection: Secondary | ICD-10-CM | POA: Diagnosis not present

## 2017-05-15 DIAGNOSIS — J441 Chronic obstructive pulmonary disease with (acute) exacerbation: Secondary | ICD-10-CM | POA: Diagnosis not present

## 2017-05-15 DIAGNOSIS — I130 Hypertensive heart and chronic kidney disease with heart failure and stage 1 through stage 4 chronic kidney disease, or unspecified chronic kidney disease: Secondary | ICD-10-CM | POA: Diagnosis not present

## 2017-05-15 DIAGNOSIS — I5023 Acute on chronic systolic (congestive) heart failure: Secondary | ICD-10-CM | POA: Diagnosis not present

## 2017-05-15 DIAGNOSIS — L03114 Cellulitis of left upper limb: Secondary | ICD-10-CM | POA: Diagnosis not present

## 2017-05-15 DIAGNOSIS — J189 Pneumonia, unspecified organism: Secondary | ICD-10-CM | POA: Diagnosis not present

## 2017-05-17 ENCOUNTER — Encounter: Payer: Self-pay | Admitting: Osteopathic Medicine

## 2017-05-17 ENCOUNTER — Ambulatory Visit (INDEPENDENT_AMBULATORY_CARE_PROVIDER_SITE_OTHER): Payer: Medicare Other | Admitting: Osteopathic Medicine

## 2017-05-17 VITALS — BP 139/75 | HR 80 | Temp 97.9°F | Wt 138.0 lb

## 2017-05-17 DIAGNOSIS — J441 Chronic obstructive pulmonary disease with (acute) exacerbation: Secondary | ICD-10-CM | POA: Diagnosis not present

## 2017-05-17 DIAGNOSIS — L0390 Cellulitis, unspecified: Secondary | ICD-10-CM | POA: Diagnosis not present

## 2017-05-17 DIAGNOSIS — I5022 Chronic systolic (congestive) heart failure: Secondary | ICD-10-CM | POA: Diagnosis not present

## 2017-05-17 DIAGNOSIS — R4189 Other symptoms and signs involving cognitive functions and awareness: Secondary | ICD-10-CM | POA: Diagnosis not present

## 2017-05-17 DIAGNOSIS — I2510 Atherosclerotic heart disease of native coronary artery without angina pectoris: Secondary | ICD-10-CM

## 2017-05-17 DIAGNOSIS — J440 Chronic obstructive pulmonary disease with acute lower respiratory infection: Secondary | ICD-10-CM | POA: Diagnosis not present

## 2017-05-17 DIAGNOSIS — J690 Pneumonitis due to inhalation of food and vomit: Secondary | ICD-10-CM

## 2017-05-17 DIAGNOSIS — J449 Chronic obstructive pulmonary disease, unspecified: Secondary | ICD-10-CM | POA: Diagnosis not present

## 2017-05-17 DIAGNOSIS — A419 Sepsis, unspecified organism: Secondary | ICD-10-CM

## 2017-05-17 DIAGNOSIS — J189 Pneumonia, unspecified organism: Secondary | ICD-10-CM | POA: Diagnosis not present

## 2017-05-17 DIAGNOSIS — K224 Dyskinesia of esophagus: Secondary | ICD-10-CM

## 2017-05-17 DIAGNOSIS — L03114 Cellulitis of left upper limb: Secondary | ICD-10-CM | POA: Diagnosis not present

## 2017-05-17 DIAGNOSIS — I5023 Acute on chronic systolic (congestive) heart failure: Secondary | ICD-10-CM | POA: Diagnosis not present

## 2017-05-17 DIAGNOSIS — I130 Hypertensive heart and chronic kidney disease with heart failure and stage 1 through stage 4 chronic kidney disease, or unspecified chronic kidney disease: Secondary | ICD-10-CM | POA: Diagnosis not present

## 2017-05-17 DIAGNOSIS — W5503XA Scratched by cat, initial encounter: Secondary | ICD-10-CM

## 2017-05-17 DIAGNOSIS — M7989 Other specified soft tissue disorders: Secondary | ICD-10-CM | POA: Diagnosis not present

## 2017-05-17 LAB — CBC
HCT: 38.2 % — ABNORMAL LOW (ref 38.5–50.0)
Hemoglobin: 12.1 g/dL — ABNORMAL LOW (ref 13.2–17.1)
MCH: 32.5 pg (ref 27.0–33.0)
MCHC: 31.7 g/dL — AB (ref 32.0–36.0)
MCV: 102.7 fL — ABNORMAL HIGH (ref 80.0–100.0)
MPV: 10 fL (ref 7.5–12.5)
PLATELETS: 249 10*3/uL (ref 140–400)
RBC: 3.72 MIL/uL — AB (ref 4.20–5.80)
RDW: 15.1 % — AB (ref 11.0–15.0)
WBC: 11 10*3/uL — ABNORMAL HIGH (ref 3.8–10.8)

## 2017-05-17 MED ORDER — FUROSEMIDE 20 MG PO TABS: 20.0000 mg | ORAL_TABLET | Freq: Every day | ORAL | 1 refills | Status: DC

## 2017-05-17 MED ORDER — AMOXICILLIN-POT CLAVULANATE 875-125 MG PO TABS: 1.0000 | ORAL_TABLET | Freq: Two times a day (BID) | ORAL | 0 refills | Status: DC

## 2017-05-17 MED ORDER — QUETIAPINE FUMARATE 100 MG PO TABS: 100.0000 mg | ORAL_TABLET | Freq: Every day | ORAL | 0 refills | Status: DC

## 2017-05-17 MED ORDER — PREDNISONE 5 MG PO TABS: 15.0000 mg | ORAL_TABLET | Freq: Every day | ORAL | 0 refills | Status: DC

## 2017-05-17 NOTE — Patient Instructions (Addendum)
Medications:  Metoprolol continue 1/2 tablet  Seroquel continue 100 mg in evenings  Rosuvastatin ok to take daily  Lasix take 20 mg daily but increase to 2 tablets if selling/weight gain, take up to 3 days in a row   Prednisone 15 mg daily until told otherwise by pulmonologist    Thickening Liquids for Dysphagia Diet If you are on the dysphagia diet, you may need to thicken drinks, soups, foods that melt at room temperature, and other liquids before you drink or eat them. Thickening liquids makes them easier to swallow. It also reduces the risk of liquid traveling to your lungs. To make a thickened liquid you will need to add a commercial thickening product or a soft food to the liquid until it reaches the consistency it needs to be. Your health care provider or dietitian will explain to you the consistency you need to aim for. Liquid consistencies include:  Thin. Thin liquids include most drinks (such as water, milk, tea, soda, juice, carbonated drinks), as well as ice cream, sherbet, sorbet, ice pops, and broth-based soups.  Nectar-like. Nectar-like liquids include maple syrup and creamy soup.  Honey-like. Honey-like liquids are made to be runny but are thick like honey. They cannot be sipped through a straw.  Spoon-thick. Spoon-thick liquids are thick, like pudding.  My plan I should thicken my liquids to a nectar-like consistency. Diet guidelines  Thicken liquids to the consistency your health care provider recommends.  Follow your dietitian's or health care provider's recommendation on how to thicken your liquids.  See your dietitian or health care provider regularly for help with your dietary changes. How can I thicken my liquids? Liquids can be thickened with a commercial food and beverage thickener or with a soft food. [employer] Thickeners A food and beverage thickener is a powder or gel that makes a food or beverage thicker. Thickeners are sold at  pharmacies, medical supply stores, some grocery stores, and online. They can be added to both hot and cold liquids and do not change the taste of the liquid. Ask your health care provider or dietitian for a complete list of commercial thickeners. Each thickening product is different. Some need to be blended into a liquid with a blender while others can be stirred into a liquid with a fork or spoon. Follow the instructions on the product label. Soft Foods Some foods such as soups, casseroles, and gravies can be thickened with soft foods. Soft foods include:  Baby cereal.  Gravy powder.  Mashed potato.  Pureed baby food.  Instant potato flakes.  Powdered sauce mixes (such as cheese mixes).  Flour.  To use one of these soft food items, stir or mix them into the thin liquid until it reaches the desired thickness. Start with a small amount and adjust soft food and liquid as necessary. Note: Flour works best with warm liquids, such as broth. To thicken a liquid with flour, make a paste out of flour and water. Cook or warm your liquid and add the paste to it. Stir until the mixture thickens. What are some tips to make thickening liquids easier?  Take thickeners with you when eating out or traveling.  If a liquid gets too thick, add more of the thinner liquid until the desired consistency is reached.  Consider purchasing pre-made thickened drinks.  Consider using a thickening product to make your own frozen desserts. This information is not intended to replace advice given to you by your health care provider.  Make sure you discuss any questions you have with your health care provider. Document Released: 04/01/2012 Document Revised: 03/08/2016 Document Reviewed: 09/14/2013 Elsevier Interactive Patient Education  2017 [employer].

## 2017-05-17 NOTE — Progress Notes (Signed)
HPI: Bill Hinton is a 83 y.o. male  who presents to [facility] today, 05/17/17,  for chief complaint of:  Chief Complaint  Patient presents with  . Hospitalization Follow-up    Recently hospitalized for sepsis due to cellulitis of left upper extremity. Saline seems to have resolved, he recently finished his outpatient antibiotics.  Other problems addressed during hospitalization:  Hospital acquired pneumonia, likely due to aspiration. Patient has a history of dysphagia and esophageal dysfunction. Recommended mechanically grounded diet with nectar thickened liquids. Recently treated for esophageal candidiasis with course of Diflucan, enteric dilatation on last EGD.  Severe COPD, on chronic steroids. She was discharged on home dose of prednisone 20 mg daily. He has follow-up in place with his pulmonologist in the next few weeks. Last visit there for 03/22/2017 - treated for exacerbation at that time with 12 day prednisone taper. Typical dose is 15 mg daily.  Experienced jerking movements/myoclonus likely complicated and of cefepime. This was added to his allergy/intolerance list.   Daughter and wife today has some concerns about his medication regimen. He was recently seen by his neurologist, they are currently awaiting an appointment with a different neurologist for second opinion. He is being treated for dementia, restless legs, and presumably some kind of blood disorder issues or behavioral disturbance accompanying dementia. Daughter has a lot of concerns about possible side effects of Seroquel. We had discussed stopping this back in May, never really came around to this discussion again given his other medical problems. He was up to 300 mg 3 times a day that this was causing him a lot of sedation, they have been using 100 mg daily at bedtime seems to be helping him sleep.  Has some questions about how he should be taking the Lasix. Swelling is better, no  chest pain or breathing troubles.   Acute concerns today: Patient was scratched by the family Last night but didn't tell anyone, one was not cleaned. It appears scabbed over, on his left dorsum hand. They're concerned about some redness and possible infection of this.    Past medical, surgical, social and family history reviewed: Patient Active Problem List   Diagnosis Date Noted  . Benign prostatic hyperplasia 12/14/2016  . CAD (coronary artery disease), native coronary artery 12/14/2016  . Depression with anxiety 12/14/2016  . History of nephrolithiasis 12/14/2016  . Pneumothorax 12/14/2016  . Tinnitus 12/14/2016  . Vitamin D deficiency 12/14/2016  . Rhinorrhea 12/14/2016  . Declining functional status 09/26/2016  . Pneumonia of both lungs due to infectious organism 09/19/2016  . Restless leg syndrome 09/19/2016  . Acute respiratory failure with hypoxia ([place]) 09/17/2016  . Bell's palsy 09/17/2016  . Bronchiectasis ([place]) 09/17/2016  . 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 Dysphagia 08/17/2016  . Vasomotor rhinitis 08/17/2016  . Right ureteral calculus 04/13/2015  . Atherosclerotic heart disease of native coronary artery without angina pectoris 04/11/2015  . Chronic kidney disease 04/11/2015  . Diaphragmatic hernia without obstruction or gangrene 04/11/2015  . Enlarged prostate without lower urinary tract symptoms (luts) 04/11/2015  . Essential (primary) hypertension 04/11/2015  . Nocturia 04/11/2015  . Other seasonal allergic rhinitis 04/11/2015  . Panic disorder without agoraphobia 04/11/2015  . Status post cataract extraction 01/21/2014  . Presence of intraocular lens 01/21/2014  . Pseudophakia of both eyes 01/21/2014  . Nuclear sclerosis of right eye 01/19/2014  . 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 Pterygium of left eye  05/29/2012  . HLD (hyperlipidemia) 12/30/2008  . Steroid-dependent  chronic obstructive pulmonary disease ([place]) 12/30/2008  . Gastroesophageal reflux disease without esophagitis 12/30/2008   Past Surgical History:  Procedure Laterality Date  . CHOLECYSTECTOMY    . HERNIA REPAIR     Social History  Substance Use Topics  . Smoking status: Former Research scientist (life sciences)  . Smokeless tobacco: Never Used  . Alcohol use No   Family History  Problem Relation Age of Onset  . Heart attack Mother   . Heart attack Father      Current medication list and allergy/intolerance information reviewed:   Current Outpatient Prescriptions  Medication Sig Dispense Refill  . metoprolol tartrate (LOPRESSOR) 25 MG tablet Take 12.5 mg by mouth daily.    . AMBULATORY NON FORMULARY MEDICATION Disp commode chair Dx Debility and ambulatory dysfunction, CHF, COPD 1 Units prn  . aspirin 81 MG chewable tablet Chew by mouth.    . Cholecalciferol (VITAMIN D-3) 1000 units CAPS Take 1 capsule by mouth daily.    . citalopram (CELEXA) 40 MG tablet Take 40 mg by mouth daily.    . clotrimazole (LOTRIMIN) 1 % cream Apply topically.    . donepezil (ARICEPT) 10 MG tablet Take 10 mg by mouth daily.    . furosemide (LASIX) 20 MG tablet Take 1 tablet (20 mg total) by mouth daily. 90 tablet 1  . ipratropium-albuterol (DUONEB) 0.5-2.5 (3) MG/3ML SOLN Inhale into the lungs.    . isosorbide mononitrate (IMDUR) 30 MG 24 hr tablet Take by mouth.    . lansoprazole (PREVACID) 30 MG capsule Take 1 capsule (30 mg total) by mouth daily. 90 capsule 3  . montelukast (SINGULAIR) 10 MG tablet Take 10 mg by mouth daily.  11  . potassium chloride (KLOR-CON) 20 MEQ packet Take 20 mEq by mouth daily. 50 packet 5  . predniSONE (DELTASONE) 5 MG tablet Take 40mg  daily.  Reduce by one tablet every FOUR days until taking 15mg  (THREE TABLETS) daily    . rOPINIRole (REQUIP) 0.5 MG tablet Take 0.5 mg by mouth daily.  11  . rosuvastatin (CRESTOR) 5 MG tablet Take 1 tablet (5 mg total) by mouth at bedtime. 90 tablet 3  . tamsulosin  (FLOMAX) 0.4 MG CAPS capsule Take by mouth.     No current facility-administered medications for this visit.    Allergies  Allergen Reactions  . Cefepime Other (See Comments)    Myoclonus  . Hydromorphone Hcl       Review of Systems:  Constitutional:  No  fever, no chills, +recent illness, No unintentional weight changes. +significant fatigue at baseline  HEENT: No  headache  Cardiac: No  chest pain, No  Orthopnea  Respiratory:  No new shortness of breath.  Gastrointestinal: No  abdominal pain,  Musculoskeletal: No new myalgia/arthralgia  Skin: No  Rash, +other wounds/concerning lesions  Neurologic: No  slurred speech/focal weakness/facial droop  Psychiatric: No  concerns with depression, No  concerns with anxiety, +sleep problems, +mood problems  Exam:  BP 139/75   Pulse 80   Temp 97.9 F (36.6 C) (Oral)   Wt 138 lb (62.6 kg)   SpO2 94%   BMI 19.25 kg/m   Constitutional: VS see above. General Appearance: alert, well-developed, well-nourished, NAD  Eyes: Normal lids and conjunctive, non-icteric sclera  Ears, Nose, Mouth, Throat: MMM, Normal external inspection ears/nares/mouth/lips/gums  Neck: No masses, trachea midline.   Respiratory: Normal respiratory effort. no wheeze, no rhonchi, no rales  Cardiovascular: S1/S2 normal, no murmur, no  rub/gallop auscultated. RRR. +1 lower extremity edema to ankles  Neurological: Baseline mild unsteadiness to balance/coordination. No tremor  Skin: warm, dry. Small laceration about 1 inches, linear, dorsum of left hand. Mild edema, no drainage.  Psychiatric: Normal judgment/insight. Normal mood and affect. Oriented x3.    ASSESSMENT/PLAN:   Sepsis due to cellulitis ([place]) - Resolved at this point, monitor closely.  COPD mixed type ([place]) - We'll decrease dose to baseline prednisone at 15 mg until told otherwise by pulmonology - Plan: predniSONE (DELTASONE) 5 MG tablet, CBC, COMPLETE METABOLIC PANEL WITH GFR  Swelling  of both lower extremities - Plan: furosemide (LASIX) 20 MG tablet, CBC, COMPLETE METABOLIC PANEL WITH GFR  Cognitive decline - Okay to continue Seroquel 100 mg daily at bedtime until told otherwise by neurology - Plan: QUEtiapine (SEROQUEL) 100 MG tablet  Aspiration pneumonia, unspecified aspiration pneumonia type, unspecified laterality, unspecified part of lung ([place]) - Lung exam at baseline today, patient is breathing well. Discussed dietary modifications to prevent aspiration.  Cat scratch - Difficult to tell if infected given extensive skin bruising otherwise. Since wasn't cleaned at time of scratch, okay to do short course of Augmentin - Plan: amoxicillin-clavulanate (AUGMENTIN) 875-125 MG tablet  Esophageal dysfunction  Chronic systolic congestive heart failure ([place]) - Discussed daily weights, Lasix for maintenance as well as increase dose as needed for weight gain/swelling - Plan: metoprolol tartrate (LOPRESSOR) 25 MG tablet    Patient Instructions  Medications:  Metoprolol continue 1/2 tablet  Seroquel continue 100 mg in evenings  Rosuvastatin ok to take daily  Lasix take 20 mg daily but increase to 2 tablets if selling/weight gain, take up to 3 days in a row   Prednisone 15 mg daily until told otherwise by pulmonologist    Thickening Liquids for Dysphagia Diet If you are on the dysphagia diet, you may need to thicken drinks, soups, foods that melt at room temperature, and other liquids before you drink or eat them. Thickening liquids makes them easier to swallow. It also reduces the risk of liquid traveling to your lungs. To make a thickened liquid you will need to add a commercial thickening product or a soft food to the liquid until it reaches the consistency it needs to be. Your health care provider or dietitian will explain to you the consistency you need to aim for. Liquid consistencies include:  Thin. Thin liquids include most drinks (such as water, milk, tea, soda,  juice, carbonated drinks), as well as ice cream, sherbet, sorbet, ice pops, and broth-based soups.  Nectar-like. Nectar-like liquids include maple syrup and creamy soup.  Honey-like. Honey-like liquids are made to be runny but are thick like honey. They cannot be sipped through a straw.  Spoon-thick. Spoon-thick liquids are thick, like pudding.  My plan I should thicken my liquids to a nectar-like consistency. Diet guidelines  Thicken liquids to the consistency your health care provider recommends.  Follow your dietitian's or health care provider's recommendation on how to thicken your liquids.  See your dietitian or health care provider regularly for help with your dietary changes. How can I thicken my liquids? Liquids can be thickened with a commercial food and beverage thickener or with a soft food. [employer] Thickeners A food and beverage thickener is a powder or gel that makes a food or beverage thicker. Thickeners are sold at pharmacies, medical supply stores, some grocery stores, and online. They can be added to both hot and cold liquids and do not change the  taste of the liquid. Ask your health care provider or dietitian for a complete list of commercial thickeners. Each thickening product is different. Some need to be blended into a liquid with a blender while others can be stirred into a liquid with a fork or spoon. Follow the instructions on the product label. Soft Foods Some foods such as soups, casseroles, and gravies can be thickened with soft foods. Soft foods include:  Baby cereal.  Gravy powder.  Mashed potato.  Pureed baby food.  Instant potato flakes.  Powdered sauce mixes (such as cheese mixes).  Flour.  To use one of these soft food items, stir or mix them into the thin liquid until it reaches the desired thickness. Start with a small amount and adjust soft food and liquid as necessary. Note: Flour works best with warm liquids, such as  broth. To thicken a liquid with flour, make a paste out of flour and water. Cook or warm your liquid and add the paste to it. Stir until the mixture thickens. What are some tips to make thickening liquids easier?  Take thickeners with you when eating out or traveling.  If a liquid gets too thick, add more of the thinner liquid until the desired consistency is reached.  Consider purchasing pre-made thickened drinks.  Consider using a thickening product to make your own frozen desserts. This information is not intended to replace advice given to you by your health care provider. Make sure you discuss any questions you have with your health care provider. Document Released: 04/01/2012 Document Revised: 03/08/2016 Document Reviewed: 09/14/2013 Elsevier Interactive Patient Education  2017 [employer].     Visit summary with medication list and pertinent instructions was printed for patient to review. All questions at time of visit were answered - patient instructed to contact office with any additional concerns. ER/RTC precautions were reviewed with the patient. Follow-up plan: Return in about 6 weeks (around 06/28/2017) for routine check-up with Dr Sheppard Coil .  Note: Total time spent 40 minutes, greater than 50% of the visit was spent face-to-face counseling and coordinating care for the following: The primary encounter diagnosis was Sepsis due to cellulitis ([place]). Diagnoses of COPD mixed type ([place]), Swelling of both lower extremities, Cognitive decline, Aspiration pneumonia, unspecified aspiration pneumonia type, unspecified laterality, unspecified part of lung ([place]), Cat scratch, Esophageal dysfunction, and Chronic systolic congestive heart failure ([place]) were also pertinent to this visit.Marland Kitchen

## 2017-05-18 LAB — COMPLETE METABOLIC PANEL WITH GFR
ALBUMIN: 3.1 g/dL — AB (ref 3.6–5.1)
ALK PHOS: 68 U/L (ref 40–115)
ALT: 25 U/L (ref 9–46)
AST: 17 U/L (ref 10–35)
BILIRUBIN TOTAL: 0.6 mg/dL (ref 0.2–1.2)
BUN: 15 mg/dL (ref 7–25)
CALCIUM: 9.3 mg/dL (ref 8.6–10.3)
CO2: 27 mmol/L (ref 20–31)
Chloride: 104 mmol/L (ref 98–110)
Creat: 0.87 mg/dL (ref 0.70–1.11)
GFR, EST NON AFRICAN AMERICAN: 80 mL/min (ref >=60)
GFR, Est African American: >89 mL/min (ref >=60)
GLUCOSE: 59 mg/dL — AB (ref 65–99)
Potassium: 3.3 mmol/L — ABNORMAL LOW (ref 3.5–5.3)
SODIUM: 144 mmol/L (ref 135–146)
TOTAL PROTEIN: 5 g/dL — AB (ref 6.1–8.1)

## 2017-05-20 ENCOUNTER — Other Ambulatory Visit: Payer: Self-pay

## 2017-05-20 MED ORDER — TAMSULOSIN HCL 0.4 MG PO CAPS: 0.4000 mg | ORAL_CAPSULE | Freq: Every day | ORAL | 3 refills | Status: DC

## 2017-05-20 MED ORDER — LANSOPRAZOLE 30 MG PO TBDP: 30.0000 mg | ORAL_TABLET | Freq: Every day | ORAL | 3 refills | Status: DC

## 2017-05-20 NOTE — Telephone Encounter (Signed)
Refills sent, Lansoprazole changed to tablet Please see lab result note re: results and further instructions.  Thanks.

## 2017-05-20 NOTE — Telephone Encounter (Signed)
PT's daughter came by the office to ask for  Tamsulosin & Lansoprazole to be filled. PT is out of Tamsulosin and will be out of Lansoprazole as 8/7.  She would also like a tablet for Lansoprazole pt has a hard time swallowing the capsule.   She was also inquiring on the results of his blood work.  DPR is in the system for her Everlean Cherry [phone] Please call her as soon as possible.

## 2017-05-20 NOTE — Addendum Note (Signed)
Addended by: Maryla Morrow on: 05/20/2017 02:28 PM   Modules accepted: Orders

## 2017-05-21 DIAGNOSIS — J441 Chronic obstructive pulmonary disease with (acute) exacerbation: Secondary | ICD-10-CM | POA: Diagnosis not present

## 2017-05-21 DIAGNOSIS — I130 Hypertensive heart and chronic kidney disease with heart failure and stage 1 through stage 4 chronic kidney disease, or unspecified chronic kidney disease: Secondary | ICD-10-CM | POA: Diagnosis not present

## 2017-05-21 DIAGNOSIS — L03114 Cellulitis of left upper limb: Secondary | ICD-10-CM | POA: Diagnosis not present

## 2017-05-21 DIAGNOSIS — J189 Pneumonia, unspecified organism: Secondary | ICD-10-CM | POA: Diagnosis not present

## 2017-05-21 DIAGNOSIS — I5023 Acute on chronic systolic (congestive) heart failure: Secondary | ICD-10-CM | POA: Diagnosis not present

## 2017-05-21 DIAGNOSIS — J440 Chronic obstructive pulmonary disease with acute lower respiratory infection: Secondary | ICD-10-CM | POA: Diagnosis not present

## 2017-05-21 NOTE — Telephone Encounter (Signed)
Sandra notified and voiced understanding.

## 2017-05-23 DIAGNOSIS — I5023 Acute on chronic systolic (congestive) heart failure: Secondary | ICD-10-CM | POA: Diagnosis not present

## 2017-05-23 DIAGNOSIS — I130 Hypertensive heart and chronic kidney disease with heart failure and stage 1 through stage 4 chronic kidney disease, or unspecified chronic kidney disease: Secondary | ICD-10-CM | POA: Diagnosis not present

## 2017-05-23 DIAGNOSIS — L03114 Cellulitis of left upper limb: Secondary | ICD-10-CM | POA: Diagnosis not present

## 2017-05-23 DIAGNOSIS — J441 Chronic obstructive pulmonary disease with (acute) exacerbation: Secondary | ICD-10-CM | POA: Diagnosis not present

## 2017-05-23 DIAGNOSIS — J189 Pneumonia, unspecified organism: Secondary | ICD-10-CM | POA: Diagnosis not present

## 2017-05-23 DIAGNOSIS — J440 Chronic obstructive pulmonary disease with acute lower respiratory infection: Secondary | ICD-10-CM | POA: Diagnosis not present

## 2017-05-27 DIAGNOSIS — J441 Chronic obstructive pulmonary disease with (acute) exacerbation: Secondary | ICD-10-CM | POA: Diagnosis not present

## 2017-05-27 DIAGNOSIS — I5023 Acute on chronic systolic (congestive) heart failure: Secondary | ICD-10-CM | POA: Diagnosis not present

## 2017-05-27 DIAGNOSIS — L03114 Cellulitis of left upper limb: Secondary | ICD-10-CM | POA: Diagnosis not present

## 2017-05-27 DIAGNOSIS — J440 Chronic obstructive pulmonary disease with acute lower respiratory infection: Secondary | ICD-10-CM | POA: Diagnosis not present

## 2017-05-27 DIAGNOSIS — J189 Pneumonia, unspecified organism: Secondary | ICD-10-CM | POA: Diagnosis not present

## 2017-05-27 DIAGNOSIS — I130 Hypertensive heart and chronic kidney disease with heart failure and stage 1 through stage 4 chronic kidney disease, or unspecified chronic kidney disease: Secondary | ICD-10-CM | POA: Diagnosis not present

## 2017-05-28 ENCOUNTER — Ambulatory Visit: Payer: Medicare Other | Admitting: Neurology

## 2017-05-28 DIAGNOSIS — L03114 Cellulitis of left upper limb: Secondary | ICD-10-CM | POA: Diagnosis not present

## 2017-05-28 DIAGNOSIS — I130 Hypertensive heart and chronic kidney disease with heart failure and stage 1 through stage 4 chronic kidney disease, or unspecified chronic kidney disease: Secondary | ICD-10-CM | POA: Diagnosis not present

## 2017-05-28 DIAGNOSIS — J440 Chronic obstructive pulmonary disease with acute lower respiratory infection: Secondary | ICD-10-CM | POA: Diagnosis not present

## 2017-05-28 DIAGNOSIS — J441 Chronic obstructive pulmonary disease with (acute) exacerbation: Secondary | ICD-10-CM | POA: Diagnosis not present

## 2017-05-28 DIAGNOSIS — J189 Pneumonia, unspecified organism: Secondary | ICD-10-CM | POA: Diagnosis not present

## 2017-05-28 DIAGNOSIS — I5023 Acute on chronic systolic (congestive) heart failure: Secondary | ICD-10-CM | POA: Diagnosis not present

## 2017-05-29 DIAGNOSIS — J189 Pneumonia, unspecified organism: Secondary | ICD-10-CM | POA: Diagnosis not present

## 2017-05-29 DIAGNOSIS — J440 Chronic obstructive pulmonary disease with acute lower respiratory infection: Secondary | ICD-10-CM | POA: Diagnosis not present

## 2017-05-29 DIAGNOSIS — L03114 Cellulitis of left upper limb: Secondary | ICD-10-CM | POA: Diagnosis not present

## 2017-05-29 DIAGNOSIS — I5023 Acute on chronic systolic (congestive) heart failure: Secondary | ICD-10-CM | POA: Diagnosis not present

## 2017-05-29 DIAGNOSIS — I130 Hypertensive heart and chronic kidney disease with heart failure and stage 1 through stage 4 chronic kidney disease, or unspecified chronic kidney disease: Secondary | ICD-10-CM | POA: Diagnosis not present

## 2017-05-29 DIAGNOSIS — J441 Chronic obstructive pulmonary disease with (acute) exacerbation: Secondary | ICD-10-CM | POA: Diagnosis not present

## 2017-05-30 DIAGNOSIS — I429 Cardiomyopathy, unspecified: Secondary | ICD-10-CM | POA: Diagnosis not present

## 2017-05-30 DIAGNOSIS — I5022 Chronic systolic (congestive) heart failure: Secondary | ICD-10-CM | POA: Diagnosis not present

## 2017-05-31 DIAGNOSIS — J479 Bronchiectasis, uncomplicated: Secondary | ICD-10-CM | POA: Diagnosis not present

## 2017-05-31 DIAGNOSIS — R0902 Hypoxemia: Secondary | ICD-10-CM | POA: Diagnosis not present

## 2017-05-31 DIAGNOSIS — I509 Heart failure, unspecified: Secondary | ICD-10-CM | POA: Diagnosis not present

## 2017-05-31 DIAGNOSIS — J449 Chronic obstructive pulmonary disease, unspecified: Secondary | ICD-10-CM | POA: Diagnosis not present

## 2017-06-04 DIAGNOSIS — I5023 Acute on chronic systolic (congestive) heart failure: Secondary | ICD-10-CM | POA: Diagnosis not present

## 2017-06-04 DIAGNOSIS — L03114 Cellulitis of left upper limb: Secondary | ICD-10-CM | POA: Diagnosis not present

## 2017-06-04 DIAGNOSIS — J440 Chronic obstructive pulmonary disease with acute lower respiratory infection: Secondary | ICD-10-CM | POA: Diagnosis not present

## 2017-06-04 DIAGNOSIS — J189 Pneumonia, unspecified organism: Secondary | ICD-10-CM | POA: Diagnosis not present

## 2017-06-04 DIAGNOSIS — J441 Chronic obstructive pulmonary disease with (acute) exacerbation: Secondary | ICD-10-CM | POA: Diagnosis not present

## 2017-06-04 DIAGNOSIS — I130 Hypertensive heart and chronic kidney disease with heart failure and stage 1 through stage 4 chronic kidney disease, or unspecified chronic kidney disease: Secondary | ICD-10-CM | POA: Diagnosis not present

## 2017-06-06 DIAGNOSIS — R1312 Dysphagia, oropharyngeal phase: Secondary | ICD-10-CM | POA: Diagnosis not present

## 2017-06-06 DIAGNOSIS — R4189 Other symptoms and signs involving cognitive functions and awareness: Secondary | ICD-10-CM | POA: Diagnosis not present

## 2017-06-06 DIAGNOSIS — B449 Aspergillosis, unspecified: Secondary | ICD-10-CM | POA: Diagnosis not present

## 2017-06-06 DIAGNOSIS — R845 Abnormal microbiological findings in specimens from respiratory organs and thorax: Secondary | ICD-10-CM | POA: Diagnosis not present

## 2017-06-06 DIAGNOSIS — I429 Cardiomyopathy, unspecified: Secondary | ICD-10-CM | POA: Diagnosis not present

## 2017-06-06 DIAGNOSIS — J479 Bronchiectasis, uncomplicated: Secondary | ICD-10-CM | POA: Diagnosis not present

## 2017-06-06 DIAGNOSIS — I5022 Chronic systolic (congestive) heart failure: Secondary | ICD-10-CM | POA: Diagnosis not present

## 2017-06-11 DIAGNOSIS — L03114 Cellulitis of left upper limb: Secondary | ICD-10-CM | POA: Diagnosis not present

## 2017-06-11 DIAGNOSIS — I5023 Acute on chronic systolic (congestive) heart failure: Secondary | ICD-10-CM | POA: Diagnosis not present

## 2017-06-11 DIAGNOSIS — I130 Hypertensive heart and chronic kidney disease with heart failure and stage 1 through stage 4 chronic kidney disease, or unspecified chronic kidney disease: Secondary | ICD-10-CM | POA: Diagnosis not present

## 2017-06-11 DIAGNOSIS — J189 Pneumonia, unspecified organism: Secondary | ICD-10-CM | POA: Diagnosis not present

## 2017-06-11 DIAGNOSIS — J440 Chronic obstructive pulmonary disease with acute lower respiratory infection: Secondary | ICD-10-CM | POA: Diagnosis not present

## 2017-06-11 DIAGNOSIS — J441 Chronic obstructive pulmonary disease with (acute) exacerbation: Secondary | ICD-10-CM | POA: Diagnosis not present

## 2017-06-13 DIAGNOSIS — J441 Chronic obstructive pulmonary disease with (acute) exacerbation: Secondary | ICD-10-CM | POA: Diagnosis not present

## 2017-06-13 DIAGNOSIS — L03114 Cellulitis of left upper limb: Secondary | ICD-10-CM | POA: Diagnosis not present

## 2017-06-13 DIAGNOSIS — J189 Pneumonia, unspecified organism: Secondary | ICD-10-CM | POA: Diagnosis not present

## 2017-06-13 DIAGNOSIS — J440 Chronic obstructive pulmonary disease with acute lower respiratory infection: Secondary | ICD-10-CM | POA: Diagnosis not present

## 2017-06-13 DIAGNOSIS — I130 Hypertensive heart and chronic kidney disease with heart failure and stage 1 through stage 4 chronic kidney disease, or unspecified chronic kidney disease: Secondary | ICD-10-CM | POA: Diagnosis not present

## 2017-06-13 DIAGNOSIS — I5023 Acute on chronic systolic (congestive) heart failure: Secondary | ICD-10-CM | POA: Diagnosis not present

## 2017-06-14 ENCOUNTER — Ambulatory Visit: Payer: Medicare Other | Admitting: Osteopathic Medicine

## 2017-06-18 DIAGNOSIS — J440 Chronic obstructive pulmonary disease with acute lower respiratory infection: Secondary | ICD-10-CM | POA: Diagnosis not present

## 2017-06-18 DIAGNOSIS — I130 Hypertensive heart and chronic kidney disease with heart failure and stage 1 through stage 4 chronic kidney disease, or unspecified chronic kidney disease: Secondary | ICD-10-CM | POA: Diagnosis not present

## 2017-06-18 DIAGNOSIS — I5023 Acute on chronic systolic (congestive) heart failure: Secondary | ICD-10-CM | POA: Diagnosis not present

## 2017-06-18 DIAGNOSIS — J189 Pneumonia, unspecified organism: Secondary | ICD-10-CM | POA: Diagnosis not present

## 2017-06-18 DIAGNOSIS — L03114 Cellulitis of left upper limb: Secondary | ICD-10-CM | POA: Diagnosis not present

## 2017-06-18 DIAGNOSIS — J441 Chronic obstructive pulmonary disease with (acute) exacerbation: Secondary | ICD-10-CM | POA: Diagnosis not present

## 2017-06-21 ENCOUNTER — Ambulatory Visit: Payer: Medicare Other | Admitting: Osteopathic Medicine

## 2017-06-21 DIAGNOSIS — I130 Hypertensive heart and chronic kidney disease with heart failure and stage 1 through stage 4 chronic kidney disease, or unspecified chronic kidney disease: Secondary | ICD-10-CM | POA: Diagnosis not present

## 2017-06-21 DIAGNOSIS — L03114 Cellulitis of left upper limb: Secondary | ICD-10-CM | POA: Diagnosis not present

## 2017-06-21 DIAGNOSIS — J441 Chronic obstructive pulmonary disease with (acute) exacerbation: Secondary | ICD-10-CM | POA: Diagnosis not present

## 2017-06-21 DIAGNOSIS — J189 Pneumonia, unspecified organism: Secondary | ICD-10-CM | POA: Diagnosis not present

## 2017-06-21 DIAGNOSIS — I5023 Acute on chronic systolic (congestive) heart failure: Secondary | ICD-10-CM | POA: Diagnosis not present

## 2017-06-21 DIAGNOSIS — J440 Chronic obstructive pulmonary disease with acute lower respiratory infection: Secondary | ICD-10-CM | POA: Diagnosis not present

## 2017-06-27 DIAGNOSIS — I130 Hypertensive heart and chronic kidney disease with heart failure and stage 1 through stage 4 chronic kidney disease, or unspecified chronic kidney disease: Secondary | ICD-10-CM | POA: Diagnosis not present

## 2017-06-27 DIAGNOSIS — J189 Pneumonia, unspecified organism: Secondary | ICD-10-CM | POA: Diagnosis not present

## 2017-06-27 DIAGNOSIS — J441 Chronic obstructive pulmonary disease with (acute) exacerbation: Secondary | ICD-10-CM | POA: Diagnosis not present

## 2017-06-27 DIAGNOSIS — L03114 Cellulitis of left upper limb: Secondary | ICD-10-CM | POA: Diagnosis not present

## 2017-06-27 DIAGNOSIS — J440 Chronic obstructive pulmonary disease with acute lower respiratory infection: Secondary | ICD-10-CM | POA: Diagnosis not present

## 2017-06-27 DIAGNOSIS — I5023 Acute on chronic systolic (congestive) heart failure: Secondary | ICD-10-CM | POA: Diagnosis not present

## 2017-06-28 ENCOUNTER — Ambulatory Visit: Payer: Medicare Other | Admitting: Osteopathic Medicine

## 2017-07-05 ENCOUNTER — Ambulatory Visit (INDEPENDENT_AMBULATORY_CARE_PROVIDER_SITE_OTHER): Payer: Medicare Other | Admitting: Osteopathic Medicine

## 2017-07-05 ENCOUNTER — Encounter: Payer: Self-pay | Admitting: Osteopathic Medicine

## 2017-07-05 VITALS — BP 135/82 | HR 68 | Wt 140.0 lb

## 2017-07-05 DIAGNOSIS — B078 Other viral warts: Secondary | ICD-10-CM

## 2017-07-05 DIAGNOSIS — E876 Hypokalemia: Secondary | ICD-10-CM | POA: Diagnosis not present

## 2017-07-05 DIAGNOSIS — J441 Chronic obstructive pulmonary disease with (acute) exacerbation: Secondary | ICD-10-CM | POA: Diagnosis not present

## 2017-07-05 DIAGNOSIS — G2581 Restless legs syndrome: Secondary | ICD-10-CM

## 2017-07-05 DIAGNOSIS — R4189 Other symptoms and signs involving cognitive functions and awareness: Secondary | ICD-10-CM | POA: Diagnosis not present

## 2017-07-05 DIAGNOSIS — I2510 Atherosclerotic heart disease of native coronary artery without angina pectoris: Secondary | ICD-10-CM | POA: Diagnosis not present

## 2017-07-05 DIAGNOSIS — I5022 Chronic systolic (congestive) heart failure: Secondary | ICD-10-CM

## 2017-07-05 DIAGNOSIS — J449 Chronic obstructive pulmonary disease, unspecified: Secondary | ICD-10-CM

## 2017-07-05 DIAGNOSIS — F0390 Unspecified dementia without behavioral disturbance: Secondary | ICD-10-CM | POA: Diagnosis not present

## 2017-07-05 DIAGNOSIS — I130 Hypertensive heart and chronic kidney disease with heart failure and stage 1 through stage 4 chronic kidney disease, or unspecified chronic kidney disease: Secondary | ICD-10-CM | POA: Diagnosis not present

## 2017-07-05 DIAGNOSIS — F3341 Major depressive disorder, recurrent, in partial remission: Secondary | ICD-10-CM | POA: Diagnosis not present

## 2017-07-05 DIAGNOSIS — G510 Bell's palsy: Secondary | ICD-10-CM

## 2017-07-05 DIAGNOSIS — J440 Chronic obstructive pulmonary disease with acute lower respiratory infection: Secondary | ICD-10-CM | POA: Diagnosis not present

## 2017-07-05 DIAGNOSIS — L03114 Cellulitis of left upper limb: Secondary | ICD-10-CM | POA: Diagnosis not present

## 2017-07-05 DIAGNOSIS — Z23 Encounter for immunization: Secondary | ICD-10-CM

## 2017-07-05 DIAGNOSIS — I5023 Acute on chronic systolic (congestive) heart failure: Secondary | ICD-10-CM | POA: Diagnosis not present

## 2017-07-05 DIAGNOSIS — J189 Pneumonia, unspecified organism: Secondary | ICD-10-CM | POA: Diagnosis not present

## 2017-07-05 DIAGNOSIS — J479 Bronchiectasis, uncomplicated: Secondary | ICD-10-CM

## 2017-07-05 LAB — BASIC METABOLIC PANEL WITH GFR
BUN/Creatinine Ratio: 16 (calc) (ref 6–22)
BUN: 18 mg/dL (ref 7–25)
CHLORIDE: 105 mmol/L (ref 98–110)
CO2: 33 mmol/L — AB (ref 20–32)
CREATININE: 1.14 mg/dL — AB (ref 0.70–1.11)
Calcium: 9.6 mg/dL (ref 8.6–10.3)
GFR, Est African American: 69 mL/min/{1.73_m2} (ref >=60)
GFR, Est Non African American: 59 mL/min/{1.73_m2} — ABNORMAL LOW (ref >=60)
GLUCOSE: 67 mg/dL (ref 65–99)
Potassium: 3.7 mmol/L (ref 3.5–5.3)
SODIUM: 145 mmol/L (ref 135–146)

## 2017-07-05 MED ORDER — LANSOPRAZOLE 30 MG PO CPDR: 30.0000 mg | DELAYED_RELEASE_CAPSULE | Freq: Every day | ORAL | 3 refills | Status: AC

## 2017-07-05 MED ORDER — ASPIRIN 81 MG PO CHEW: 81.0000 mg | CHEWABLE_TABLET | Freq: Every day | ORAL | 3 refills | Status: AC

## 2017-07-05 MED ORDER — GUAIFENESIN 100 MG/5ML PO SYRP: 200.0000 mg | ORAL_SOLUTION | Freq: Four times a day (QID) | ORAL | 1 refills | Status: DC

## 2017-07-05 NOTE — Patient Instructions (Addendum)
Plan:  Ask about FMLA to take Mr. Macken to appointments   Would recommend follow-up with Sports Medicine - Dr. Georgina Snell or Dr. Darene Lamer here in the office.   Refills on medications sent.   Will recheck lab  If still billing problems and don't hear back, call our office and ask to be put in touch with Abigail Butts

## 2017-07-05 NOTE — Progress Notes (Signed)
HPI: Bill Hinton is a 83 y.o. male  who presents to [facility] today, 07/05/17,  for chief complaint of:  Chief Complaint  Patient presents with  . Follow-up    Swallowing dysfunction: Has some difficulty swallowing larger tablets or even capsules. Requests changing few of the medications to something a little bit easier for him, including guaifenesin syrup, lansoprazole capsules rather than dissolvable  Skin concern: He keeps scratching at small spot on the left knee. He had what sounds like an abscess on the right side of his face which he squeezed and drained a good bit of pus out and now the skin there just seems firm  Dementia: Frequently confused, today is at about baseline. They have upcoming appointment with neurologist next week but they are worried that this may be too far to go routinely. Other neurologic issues include apparently poorly controlled restless leg syndrome, questionable history of mood disorder/depression  Skeletal skeletal: Difficulty participating in therapy exercises due to foot and knee pain. Knee pain is worse on the left.  Cardiology records reviewed: Chronic systolic congestive heart failure  05/30/2017: Hypotensive 80/52- discontinued Imdur, hold Flomax, continue Toprol  06/06/2017. Blood pressure better at 112/72. Voiding without issues since discontinuation of Flomax. Continue no ACE/ARB due to blood pressure issues.  Pulmonology and infectious disease records reviewed: COPD and bronchiectasis  05/31/2017: Stable PFT, continue supplemental oxygen as needed to maintain SaO2 greater than 88%. Continue vest therapy and Mucinex for secretion clearance.  06/06/2017: Infectious disease - Evaluation for Positive Culture Consult (Aspergillus fumigatus, Paecilomyces lilacinus & Mycobacterium chelonae). Thought to be colonization of the airways due to bronchiectasis. Another culture was collected, planning for follow-up imaging  of the chest in 2 months. Or suspicious of swallowing impairment  Patient is accompanied by wife Holley Raring and daughter Katharine Look who assist with history-taking.     Past medical, surgical, social and family history reviewed: Patient Active Problem List   Diagnosis Date Noted  . Sepsis due to cellulitis ([place]) 05/02/2017  . Mass of upper lobe of right lung 04/09/2017  . Thrombocytopenia ([place]) 04/09/2017  . Benign prostatic hyperplasia 12/14/2016  . CAD (coronary artery disease), native coronary artery 12/14/2016  . Depression with anxiety 12/14/2016  . History of nephrolithiasis 12/14/2016  . Pneumothorax 12/14/2016  . Tinnitus 12/14/2016  . Vitamin D deficiency 12/14/2016  . Rhinorrhea 12/14/2016  . Declining functional status 09/26/2016  . Hospital-acquired pneumonia 09/19/2016  . Restless leg syndrome 09/19/2016  . Acute respiratory failure with hypoxia ([place]) 09/17/2016  . Bell's palsy 09/17/2016  . Bronchiectasis ([place]) 09/17/2016  . Mounier-Kuhn bronchiectasis with acute exacerbation ([place]) 08/17/2016  . Dysphagia 08/17/2016  . Vasomotor rhinitis 08/17/2016  . Right ureteral calculus 04/13/2015  . Atherosclerotic heart disease of native coronary artery without angina pectoris 04/11/2015  . Chronic kidney disease 04/11/2015  . Diaphragmatic hernia without obstruction or gangrene 04/11/2015  . Enlarged prostate without lower urinary tract symptoms (luts) 04/11/2015  . Essential (primary) hypertension 04/11/2015  . Nocturia 04/11/2015  . Other seasonal allergic rhinitis 04/11/2015  . Panic disorder without agoraphobia 04/11/2015  . Status post cataract extraction 01/21/2014  . Presence of intraocular lens 01/21/2014  . Pseudophakia of both eyes 01/21/2014  . Nuclear sclerosis of right eye 01/19/2014  . 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 Pterygium of left eye 05/29/2012  . Hyperlipidemia 12/30/2008  . Steroid-dependent chronic  obstructive pulmonary disease ([place]) 12/30/2008  . Gastroesophageal reflux disease without esophagitis  12/30/2008   Past Surgical History:  Procedure Laterality Date  . CHOLECYSTECTOMY    . HERNIA REPAIR     Social History  Substance Use Topics  . Smoking status: Former Research scientist (life sciences)  . Smokeless tobacco: Never Used  . Alcohol use No   Family History  Problem Relation Age of Onset  . Heart attack Mother   . Heart attack Father      Current medication list and allergy/intolerance information reviewed:   Current Outpatient Prescriptions  Medication Sig Dispense Refill  . AMBULATORY NON FORMULARY MEDICATION Disp commode chair Dx Debility and ambulatory dysfunction, CHF, COPD 1 Units prn  . amoxicillin-clavulanate (AUGMENTIN) 875-125 MG tablet Take 1 tablet by mouth 2 (two) times daily. For 5 days - cat scratch 10 tablet 0  . aspirin 81 MG chewable tablet Chew by mouth.    . Cholecalciferol (VITAMIN D-3) 1000 units CAPS Take 1 capsule by mouth daily.    . citalopram (CELEXA) 40 MG tablet Take 40 mg by mouth daily.    . clotrimazole (LOTRIMIN) 1 % cream Apply topically.    . donepezil (ARICEPT) 10 MG tablet Take 10 mg by mouth daily.    . furosemide (LASIX) 20 MG tablet Take 1 tablet (20 mg total) by mouth daily. Can take 2 tablets (40 mg) If increased swelling or weight gain 90 tablet 1  . ipratropium-albuterol (DUONEB) 0.5-2.5 (3) MG/3ML SOLN Inhale into the lungs.    . isosorbide mononitrate (IMDUR) 30 MG 24 hr tablet Take by mouth.    . lansoprazole (PREVACID SOLUTAB) 30 MG disintegrating tablet Take 1 tablet (30 mg total) by mouth daily. 90 tablet 3  . metoprolol tartrate (LOPRESSOR) 25 MG tablet Take 12.5 mg by mouth daily.    . montelukast (SINGULAIR) 10 MG tablet Take 10 mg by mouth daily.  11  . potassium chloride (KLOR-CON) 20 MEQ packet Take 20 mEq by mouth daily. 50 packet 5  . predniSONE (DELTASONE) 5 MG tablet Take 3 tablets (15 mg total) by mouth daily with breakfast. Until in  to see lung doctor 30 tablet 0  . QUEtiapine (SEROQUEL) 100 MG tablet Take 1 tablet (100 mg total) by mouth at bedtime. 90 tablet 0  . rOPINIRole (REQUIP) 0.5 MG tablet Take 0.5 mg by mouth daily.  11  . rosuvastatin (CRESTOR) 5 MG tablet Take 1 tablet (5 mg total) by mouth at bedtime. 90 tablet 3  . tamsulosin (FLOMAX) 0.4 MG CAPS capsule Take 1 capsule (0.4 mg total) by mouth daily. 90 capsule 3   No current facility-administered medications for this visit.    Allergies  Allergen Reactions  . Cefepime Other (See Comments)    Myoclonus  . Hydromorphone Hcl       Review of Systems: Limited due to patient dementia, he states he feels fairly well today but overall sick/tired  Constitutional:  No  fever  Cardiac: No  chest pain  Respiratory:  No  Worse shortness of breath.   Musculoskeletal: +myalgia/arthralgia  Skin: +Rash,    Exam:  BP 135/82   Pulse 68   Wt 140 lb (63.5 kg)   BMI 19.53 kg/m   Constitutional: VS see above. General Appearance: thin, sleepy,  NAD - at baseline  Eyes: Normal lids and conjunctive, non-icteric sclera  Ears, Nose, Mouth, Throat: MMM, Normal external inspection ears/nares/mouth/lips/gums.   Neck: No masses, trachea midline.   Respiratory: Normal respiratory effort. +bilateral rhonchi and coarse breath sounds consistent with previous exams  Cardiovascular: S1/S2 normal, no  murmur, no rub/gallop auscultated. RRR. Trace lower extremity edema.  Gastrointestinal: Nontender, no masses.   Musculoskeletal: Gait normal. Maybe a bit of laxity in the left knee on anterior drawer  Neurological: Normal balance/coordination. No tremor.   Skin: warm, dry, intact. Warty growth on left knee. Right cheek indurated area consistent with healed abscess  Psychiatric: Poor judgment/insight. Normal mood and affect. Oriented x person   ASSESSMENT/PLAN:   Cognitive decline - Family will decide whether to keep her neurology appointment, asked for referral  to local neurologist and will think about it. - Plan: Ambulatory referral to Neurology  Need for immunization against influenza - Plan: Flu vaccine HIGH DOSE PF  Restless leg syndrome - Plan: Ambulatory referral to Neurology  Bell's palsy - Plan: Ambulatory referral to Neurology  Dementia without behavioral disturbance, unspecified dementia type - Plan: Ambulatory referral to Neurology  Hypokalemia - Plan: BASIC METABOLIC PANEL WITH GFR  COPD mixed type ([place]) - Continue follow-up with pulmonology  Bronchiectasis without complication ([place])  Chronic systolic congestive heart failure ([place]) - Continue follow-up with cardiology  Recurrent major depressive disorder, in partial remission ([place])  Flat wart - Warty growth on left knee, appears benign. I think freezing would just cause him more discomfort. Keep covered, watch    Patient Instructions  Plan:  Ask about FMLA to take Mr. Virani to appointments   Would recommend follow-up with Sports Medicine - Dr. Georgina Snell or Dr. Darene Lamer here in the office.   Refills on medications sent.   Will recheck lab  If still billing problems and don't hear back, call our office and ask to be put in touch with Abigail Butts     Visit summary with medication list and pertinent instructions was printed for patient to review. All questions at time of visit were answered - patient instructed to contact office with any additional concerns. ER/RTC precautions were reviewed with the patient. Follow-up plan: Return for check with sports med re: knee pain, and see Dr A in 3 months, sooner if needed .  Note: Total time spent 40 minutes, greater than 50% of the visit was spent face-to-face counseling and coordinating care for the following: The primary encounter diagnosis was Cognitive decline. Diagnoses of Need for immunization against influenza, Restless leg syndrome, Bell's palsy, Dementia without behavioral disturbance, unspecified dementia type, Hypokalemia, COPD mixed type  (HCC), Bronchiectasis without complication ([place]), Chronic systolic congestive heart failure ([place]), Recurrent major depressive disorder, in partial remission ([place]), and Flat wart were also pertinent to this visit.Marland Kitchen

## 2017-07-08 ENCOUNTER — Other Ambulatory Visit: Payer: Self-pay | Admitting: Osteopathic Medicine

## 2017-07-09 DIAGNOSIS — I130 Hypertensive heart and chronic kidney disease with heart failure and stage 1 through stage 4 chronic kidney disease, or unspecified chronic kidney disease: Secondary | ICD-10-CM | POA: Diagnosis not present

## 2017-07-09 DIAGNOSIS — L03114 Cellulitis of left upper limb: Secondary | ICD-10-CM | POA: Diagnosis not present

## 2017-07-09 DIAGNOSIS — I5023 Acute on chronic systolic (congestive) heart failure: Secondary | ICD-10-CM | POA: Diagnosis not present

## 2017-07-09 DIAGNOSIS — J189 Pneumonia, unspecified organism: Secondary | ICD-10-CM | POA: Diagnosis not present

## 2017-07-09 DIAGNOSIS — J441 Chronic obstructive pulmonary disease with (acute) exacerbation: Secondary | ICD-10-CM | POA: Diagnosis not present

## 2017-07-09 DIAGNOSIS — R1310 Dysphagia, unspecified: Secondary | ICD-10-CM | POA: Diagnosis not present

## 2017-07-09 DIAGNOSIS — J440 Chronic obstructive pulmonary disease with acute lower respiratory infection: Secondary | ICD-10-CM | POA: Diagnosis not present

## 2017-07-12 ENCOUNTER — Ambulatory Visit (INDEPENDENT_AMBULATORY_CARE_PROVIDER_SITE_OTHER): Payer: Medicare Other | Admitting: Neurology

## 2017-07-12 ENCOUNTER — Encounter: Payer: Self-pay | Admitting: Neurology

## 2017-07-12 VITALS — BP 124/50 | HR 63 | Ht 71.0 in | Wt 142.0 lb

## 2017-07-12 DIAGNOSIS — I2510 Atherosclerotic heart disease of native coronary artery without angina pectoris: Secondary | ICD-10-CM

## 2017-07-12 DIAGNOSIS — G253 Myoclonus: Secondary | ICD-10-CM | POA: Diagnosis not present

## 2017-07-12 DIAGNOSIS — F0391 Unspecified dementia with behavioral disturbance: Secondary | ICD-10-CM

## 2017-07-12 DIAGNOSIS — G2581 Restless legs syndrome: Secondary | ICD-10-CM

## 2017-07-12 DIAGNOSIS — F03B18 Unspecified dementia, moderate, with other behavioral disturbance: Secondary | ICD-10-CM

## 2017-07-12 MED ORDER — CLONAZEPAM 0.5 MG PO TABS: ORAL_TABLET | ORAL | 5 refills | Status: DC

## 2017-07-12 MED ORDER — QUETIAPINE FUMARATE 50 MG PO TABS: 50.0000 mg | ORAL_TABLET | Freq: Every day | ORAL | 6 refills | Status: DC

## 2017-07-12 NOTE — Patient Instructions (Addendum)
1. Start clonazepam 0.5mg : Take 1/2 tablet 30 minutes before bedtime (start on the second week of Requip taper,or if symptoms significantly worsen) 2. Reduce Quetiapine to 50mg  at night 3. Start tapering off the Requip to 1 tablet at night for a week, then 1/2 tablet at night for a week, then stop 4. Continue Donepezil 10mg  daily 5. Continue control of blood pressure, cholesterol, as well as physical exercises and brain stimulation exercises for brain health 6. Follow-up in 5-6 months, call for any changes  FALL PRECAUTIONS: Be cautious when walking. Scan the area for obstacles that may increase the risk of trips and falls. When getting up in the mornings, sit up at the edge of the bed for a few minutes before getting out of bed. Consider elevating the bed at the head end to avoid drop of blood pressure when getting up. Walk always in a well-lit room (use night lights in the walls). Avoid area rugs or power cords from appliances in the middle of the walkways. Use a walker or a cane if necessary and consider physical therapy for balance exercise. Get your eyesight checked regularly.  FINANCIAL OVERSIGHT: Supervision, especially oversight when making financial decisions or transactions is also recommended.  HOME SAFETY: Consider the safety of the kitchen when operating appliances like stoves, microwave oven, and blender. Consider having supervision and share cooking responsibilities until no longer able to participate in those. Accidents with firearms and other hazards in the house should be identified and addressed as well.  DRIVING: Regarding driving, in patients with progressive memory problems, driving will be impaired. We advise to have someone else do the driving if trouble finding directions or if minor accidents are reported. Independent driving assessment is available to determine safety of driving.  ABILITY TO BE LEFT ALONE: If patient is unable to contact 911 operator, consider using  [employer], or when the need is there, arrange for someone to stay with patients. Smoking is a fire hazard, consider supervision or cessation. Risk of wandering should be assessed by caregiver and if detected at any point, supervision and safe proof recommendations should be instituted.  MEDICATION SUPERVISION: Inability to self-administer medication needs to be constantly addressed. Implement a mechanism to ensure safe administration of the medications.  RECOMMENDATIONS FOR ALL PATIENTS WITH MEMORY PROBLEMS: 1. Continue to exercise (Recommend 30 minutes of walking everyday, or 3 hours every week) 2. Increase social interactions - continue going to [place] and enjoy social gatherings with friends and family 3. Eat healthy, avoid fried foods and eat more fruits and vegetables 4. Maintain adequate blood pressure, blood sugar, and blood cholesterol level. Reducing the risk of stroke and cardiovascular disease also helps promoting better memory. 5. Avoid stressful situations. Live a simple life and avoid aggravations. Organize your time and prepare for the next day in anticipation. 6. Sleep well, avoid any interruptions of sleep and avoid any distractions in the bedroom that may interfere with adequate sleep quality 7. Avoid sugar, avoid sweets as there is a strong link between excessive sugar intake, diabetes, and cognitive impairment We discussed the Mediterranean diet, which has been shown to help patients reduce the risk of progressive memory disorders and reduces cardiovascular risk. This includes eating fish, eat fruits and green leafy vegetables, nuts like almonds and hazelnuts, walnuts, and also use olive oil. Avoid fast foods and fried foods as much as possible. Avoid sweets and sugar as sugar use has been linked to worsening of memory function.  There is always a concern of  gradual progression of memory problems. If this is the case, then we may need to adjust level of care according to patient  needs. Support, both to the patient and caregiver, should then be put into place.

## 2017-07-12 NOTE — Progress Notes (Signed)
NEUROLOGY CONSULTATION NOTE  Bill Hinton MRN: [id number] DOB: Feb 15, 1932  Referring provider: Dr. Emeterio Reeve Primary care provider: Dr. Emeterio Reeve  Reason for consult:  Cognitive decline  Dear Dr Sheppard Coil:  Thank you for your kind referral of Bill Hinton for consultation of the above symptoms. Although his history is well known to you, please allow me to reiterate it for the purpose of our medical record. The patient was accompanied to the clinic by his wife and daughter who also provide collateral information. Records and images were personally reviewed where available.  HISTORY OF PRESENT ILLNESS: This is a pleasant 83 year old ambidextrous right-hand dominant man with a history of COPD, CHF, and dementia, presenting to establish care. He reports his memory is not too good. He forgets things he used to know such as locations, distances. He stopped driving 8 months ago due to vision problems, denied getting lost driving. His wife took over bill payments a year ago due to his vision issues. She administers his medications. He states he can see an image but cannot see well. His wife and daughter report that memory has been progressively worsening over the past 2 years. They report his long-term memory is "excellent," but short-term memory is a problem. He forgets more, needing help in the shower for the past 2-3 months. He can dress himself but his wife usually assists him. He can follow instructions per wife. He has not been sleeping well, usually waking up after 3-4 hours and putting his clothes on to sleep in the recliner. Family denies any wandering. He had been seeing neurologist Dr. Wynelle Link, per wife he had an MRI brain but they are not aware of the results. He has been taking Donepezil, Citalopram, and Seroquel 100mg  qhs. His wife reports that he has been having visual hallucinations, sometimes reporting seeing a dog or a child by his recliner. Over the past 2 weeks, he  thinks there is a worm under his skin and has tried to pull it out from his hand. He was on a higher dose of Seroquel which cause daytime drowsiness. On the 100mg  dose, he still wakes up at night after 3-4 hours of sleep. He is drowsy in the office today, family reports this is his usual nap time. Family reports right facial weakness 3 years ago, he was brought to [facility] where he was diagnosed with Bell's palsy, however they report that since then, everything went downhill. He was apparently in good shape until the Bell's palsy and has had vision changes and speech changes afterwards. He had speech therapy recently for dysphagia, which has helped a lot. Family denies any paranoia, he is more irritable and snaps more easily. He has spells where he hollers for his mother when he is asleep, this does not occur in the daytime.   He has a history of restless leg syndrome, his wife reports that Requip 1mg  qhs has helped a lot. He states that his legs still jump, as well as his arms. He is noted to have myoclonic jerks in the office today in either leg/arm, and trunk. Body jerks have been going on for at least 2 years. He has occasional dizziness. He has a lot of back pain and aching on his left leg. He reports a history of left ankle injury that has affected his left leg. He has mild neck pain. He denies any headaches, focal numbness/tingling, bowel/bladder dysfunction. He has noticed a decrease in sense of smell. He has occasional hand  tremors. There is no family history of dementia. He denies any history of significant head injury, no alcohol use.    PAST MEDICAL HISTORY: Past Medical History:  Diagnosis Date  . COPD (chronic obstructive pulmonary disease) ([place])   . Reflux   . Spontaneous pneumothorax     PAST SURGICAL HISTORY: Past Surgical History:  Procedure Laterality Date  . CHOLECYSTECTOMY    . HERNIA REPAIR      MEDICATIONS: Current Outpatient Prescriptions on File Prior to Visit    Medication Sig Dispense Refill  . AMBULATORY NON FORMULARY MEDICATION Disp commode chair Dx Debility and ambulatory dysfunction, CHF, COPD 1 Units prn  . aspirin 81 MG chewable tablet Chew 1 tablet (81 mg total) by mouth daily. 90 tablet 3  . Cholecalciferol (VITAMIN D-3) 1000 units CAPS Take 1 capsule by mouth daily.    . citalopram (CELEXA) 40 MG tablet TAKE 1 TABLET(40 MG) BY MOUTH DAILY 90 tablet 0  . donepezil (ARICEPT) 10 MG tablet Take 10 mg by mouth daily.    . furosemide (LASIX) 20 MG tablet Take 1 tablet (20 mg total) by mouth daily. Can take 2 tablets (40 mg) If increased swelling or weight gain 90 tablet 1  . guaifenesin (ROBITUSSIN) 100 MG/5ML syrup Take 10 mLs (200 mg total) by mouth 4 (four) times daily. 3840 mL 1  . ipratropium-albuterol (DUONEB) 0.5-2.5 (3) MG/3ML SOLN Inhale into the lungs.    . isosorbide mononitrate (IMDUR) 30 MG 24 hr tablet Take by mouth.    . lansoprazole (PREVACID) 30 MG capsule Take 1 capsule (30 mg total) by mouth daily at 12 noon. 90 capsule 3  . metoprolol tartrate (LOPRESSOR) 25 MG tablet Take 12.5 mg by mouth daily.    . montelukast (SINGULAIR) 10 MG tablet Take 10 mg by mouth daily.  11  . potassium chloride (KLOR-CON) 20 MEQ packet Take 20 mEq by mouth daily. 50 packet 5  . predniSONE (DELTASONE) 5 MG tablet Take 3 tablets (15 mg total) by mouth daily with breakfast. Until in to see lung doctor 30 tablet 0  . QUEtiapine (SEROQUEL) 100 MG tablet Take 1 tablet (100 mg total) by mouth at bedtime. 90 tablet 0  . rOPINIRole (REQUIP) 0.5 MG tablet Take 0.5 mg by mouth daily.  11  . rosuvastatin (CRESTOR) 5 MG tablet Take 1 tablet (5 mg total) by mouth at bedtime. 90 tablet 3   No current facility-administered medications on file prior to visit.     ALLERGIES: Allergies  Allergen Reactions  . Cefepime Other (See Comments)    Myoclonus  . Hydromorphone Hcl     FAMILY HISTORY: Family History  Problem Relation Age of Onset  . Heart attack  Mother   . Heart attack Father     SOCIAL HISTORY: Social History   Social History  . Marital status: Married    Spouse name: N/A  . Number of children: N/A  . Years of education: N/A   Occupational History  . Not on file.   Social History Main Topics  . Smoking status: Former Research scientist (life sciences)  . Smokeless tobacco: Never Used  . Alcohol use No  . Drug use: No  . Sexual activity: Not on file   Other Topics Concern  . Not on file   Social History Narrative  . No narrative on file    REVIEW OF SYSTEMS: Constitutional: No fevers, chills, or sweats, no generalized fatigue, change in appetite Eyes: No visual changes, double vision, eye pain Ear, nose  and throat: No hearing loss, ear pain, nasal congestion, sore throat Cardiovascular: No chest pain, palpitations Respiratory:  + shortness of breath at rest or with exertion, no wheezes GastrointestinaI: No nausea, vomiting, diarrhea, abdominal pain, fecal incontinence Genitourinary:  No dysuria, urinary retention or frequency Musculoskeletal:  + neck pain, back pain Integumentary: No rash, pruritus, + skin lesions with easy bruising Neurological: as above Psychiatric: No depression, insomnia, anxiety Endocrine: No palpitations, fatigue, diaphoresis, mood swings, change in appetite, change in weight, increased thirst Hematologic/Lymphatic:  No anemia, purpura, petechiae. Allergic/Immunologic: no itchy/runny eyes, nasal congestion, recent allergic reactions, rashes  PHYSICAL EXAM: Vitals:   07/12/17 0906  BP: (!) 124/50  Pulse: 63  SpO2: 92%   General: No acute distress, drowsy during the visit, easily arousable to answer questions and follow commands. He is noted to have myoclonic jerks of the legs, arm, and trunk during his visit today. Head:  Normocephalic/atraumatic, edentulous Eyes: Fundoscopic exam shows bilateral sharp discs, no vessel changes, exudates, or hemorrhages Neck: supple, no paraspinal tenderness, full range of  motion Back: No paraspinal tenderness Heart: regular rate and rhythm Lungs: Clear to auscultation bilaterally. Vascular: No carotid bruits. Skin/Extremities: No rash, no edema, +multiple bruises on right inner canthus, both elbow and arms Neurological Exam: Mental status: alert and oriented to person, place, and month/season, no aphasia, mild dysarthria, Fund of knowledge is appropriate.  Recent and remote memory are reduced.  Attention and concentration are impaired.    Able to name objects and repeat phrases. CDT 1/5 MMSE - Mini Mental State Exam 07/12/2017  Orientation to time 2  Orientation to Place 5  Registration 3  Attention/ Calculation 1  Recall 1  Language- name 2 objects 2  Language- repeat 1  Language- follow 3 step command 3  Language- read & follow direction 1  Write a sentence 1  Copy design 0  Total score 20   Cranial nerves: CN I: not tested CN II: pupils equal, round and reactive to light, visual fields intact, fundi unremarkable. CN III, IV, VI:  full range of motion, no nystagmus, no ptosis CN V: facial sensation intact CN VII: symmetric smile, Bell's phenomenon with right eye on eye closure (orbicularis oculi weakness) CN VIII: hearing intact to finger rub CN IX, X: gag intact, uvula midline CN XI: sternocleidomastoid and trapezius muscles intact CN XII: tongue midline Bulk & Tone: normal, no cogwheeling, no fasciculations. Motor: 5/5 throughout except for 4/5 left hip flexion, no pronator drift. Sensation: intact to light touch, cold, pin, vibration and joint position sense.  No extinction to double simultaneous stimulation.  Romberg test negative Deep Tendon Reflexes: +1 throughout, no ankle clonus Plantar responses: downgoing bilaterally Cerebellar: no incoordination on finger to nose, heel to shin. No dysdiadochokinesia Gait: slow and cautious with walker, no ataxia Tremor: no resting tremor, mild postural tremor, no action tremor  IMPRESSION: This is  an 83 year old ambidextrous right-hand dominant man with a history of COPD, CHF, dementia, RLS, right Bell's palsy, presenting to establish care. His neurological exam shows mild left proximal leg weakness, family reports he is scheduled to see Sports Medicine soon. His MMSE today is 20/30, symptoms suggestive of mild to moderate dementia with behavioral changes (hallucinations). His family is concerned about his medications. Continue Donepezil 10mg  daily and Citalopram 10mg  daily. We discussed the Seroquel for behavioral changes, family describes formication where he thinks worms are crawling under his skin. Would continue Seroquel but lower dose to 50mg  qhs. He is drowsy in  the office today. We discussed how the Requip can also potentially cause more confusion, we will start tapering this off and use a different medication for the RLS. Although we would usually avoid benzodiazepines in this age group, a very low dose clonazepam 0.25mg  35mins before bedtime may help with RLS and sleep maintenance issues. It may help with the myoclonic jerks as well.  Records from his prior neurologist will be requested for review. We discussed 24/7 care, and need for more help at home (caregiver fatigue). It appears his children are helping out a lot as well. Resources for [employer] through the Alzheimer's association was given today, they are interested in day programs. He may eventually need higher level of care such as assisted living. We discussed the importance of control of vascular risk factors, physical exercise, and brain stimulation exercises for brain health. He will follow-up in 5-6 months and knows to call for any changes.   Thank you for allowing me to participate in the care of this patient. Please do not hesitate to call for any questions or concerns.   Ellouise Newer, M.D.  CC: Dr. Sheppard Coil

## 2017-07-16 ENCOUNTER — Ambulatory Visit (INDEPENDENT_AMBULATORY_CARE_PROVIDER_SITE_OTHER): Payer: Medicare Other

## 2017-07-16 ENCOUNTER — Ambulatory Visit (INDEPENDENT_AMBULATORY_CARE_PROVIDER_SITE_OTHER): Payer: Medicare Other | Admitting: Family Medicine

## 2017-07-16 VITALS — BP 110/52 | HR 73 | Temp 97.7°F | Wt 144.0 lb

## 2017-07-16 DIAGNOSIS — M545 Low back pain: Secondary | ICD-10-CM | POA: Diagnosis not present

## 2017-07-16 DIAGNOSIS — I2510 Atherosclerotic heart disease of native coronary artery without angina pectoris: Secondary | ICD-10-CM | POA: Diagnosis not present

## 2017-07-16 DIAGNOSIS — I700 Atherosclerosis of aorta: Secondary | ICD-10-CM

## 2017-07-16 DIAGNOSIS — M4186 Other forms of scoliosis, lumbar region: Secondary | ICD-10-CM | POA: Diagnosis not present

## 2017-07-16 DIAGNOSIS — S99922A Unspecified injury of left foot, initial encounter: Secondary | ICD-10-CM | POA: Diagnosis not present

## 2017-07-16 DIAGNOSIS — M1288 Other specific arthropathies, not elsewhere classified, other specified site: Secondary | ICD-10-CM | POA: Diagnosis not present

## 2017-07-16 DIAGNOSIS — G8929 Other chronic pain: Secondary | ICD-10-CM

## 2017-07-16 DIAGNOSIS — M5416 Radiculopathy, lumbar region: Secondary | ICD-10-CM | POA: Diagnosis not present

## 2017-07-16 DIAGNOSIS — M25572 Pain in left ankle and joints of left foot: Secondary | ICD-10-CM

## 2017-07-16 DIAGNOSIS — M79672 Pain in left foot: Secondary | ICD-10-CM | POA: Diagnosis not present

## 2017-07-16 DIAGNOSIS — L98491 Non-pressure chronic ulcer of skin of other sites limited to breakdown of skin: Secondary | ICD-10-CM

## 2017-07-16 DIAGNOSIS — S99912A Unspecified injury of left ankle, initial encounter: Secondary | ICD-10-CM | POA: Diagnosis not present

## 2017-07-16 NOTE — Patient Instructions (Signed)
Thank you for coming in today. Get xray lumbar spine now.  We will likely start Gabapentin at bedtime.   Recheck with me in 4 weeks or sooner if needed.   For the wound the knee keep it covered with Vaseline and a secure dressing to hopefull prevent picking.    Sciatica Sciatica is pain, numbness, weakness, or tingling along the path of the sciatic nerve. The sciatic nerve starts in the lower back and runs down the back of each leg. The nerve controls the muscles in the lower leg and in the back of the knee. It also provides feeling (sensation) to the back of the thigh, the lower leg, and the sole of the foot. Sciatica is a symptom of another medical condition that pinches or puts pressure on the sciatic nerve. Generally, sciatica only affects one side of the body. Sciatica usually goes away on its own or with treatment. In some cases, sciatica may keep coming back (recur). What are the causes? This condition is caused by pressure on the sciatic nerve, or pinching of the sciatic nerve. This may be the result of:  A disk in between the bones of the spine (vertebrae) bulging out too far (herniated disk).  Age-related changes in the spinal disks (degenerative disk disease).  A pain disorder that affects a muscle in the buttock (piriformis syndrome).  Extra bone growth (bone spur) near the sciatic nerve.  An injury or break (fracture) of the pelvis.  Pregnancy.  Tumor (rare).  What increases the risk? The following factors may make you more likely to develop this condition:  Playing sports that place pressure or stress on the spine, such as football or weight lifting.  Having poor strength and flexibility.  A history of back injury.  A history of back surgery.  Sitting for long periods of time.  Doing activities that involve repetitive bending or lifting.  Obesity.  What are the signs or symptoms? Symptoms can vary from mild to very severe, and they may include:  Any of  these problems in the lower back, leg, hip, or buttock: ? Mild tingling or dull aches. ? Burning sensations. ? Sharp pains.  Numbness in the back of the calf or the sole of the foot.  Leg weakness.  Severe back pain that makes movement difficult.  These symptoms may get worse when you cough, sneeze, or laugh, or when you sit or stand for long periods of time. Being overweight may also make symptoms worse. In some cases, symptoms may recur over time. How is this diagnosed? This condition may be diagnosed based on:  Your symptoms.  A physical exam. Your health care provider may ask you to do certain movements to check whether those movements trigger your symptoms.  You may have tests, including: ? Blood tests. ? X-rays. ? MRI. ? CT scan.  How is this treated? In many cases, this condition improves on its own, without any treatment. However, treatment may include:  Reducing or modifying physical activity during periods of pain.  Exercising and stretching to strengthen your abdomen and improve the flexibility of your spine.  Icing and applying heat to the affected area.  Medicines that help: ? To relieve pain and swelling. ? To relax your muscles.  Injections of medicines that help to relieve pain, irritation, and inflammation around the sciatic nerve (steroids).  Surgery.  Follow these instructions at home: Medicines  Take over-the-counter and prescription medicines only as told by your health care provider.  Do not  drive or operate heavy machinery while taking prescription pain medicine. Managing pain  If directed, apply ice to the affected area. ? Put ice in a plastic bag. ? Place a towel between your skin and the bag. ? Leave the ice on for 20 minutes, 2-3 times a day.  After icing, apply heat to the affected area before you exercise or as often as told by your health care provider. Use the heat source that your health care provider recommends, such as a moist  heat pack or a heating pad. ? Place a towel between your skin and the heat source. ? Leave the heat on for 20-30 minutes. ? Remove the heat if your skin turns bright red. This is especially important if you are unable to feel pain, heat, or cold. You may have a greater risk of getting burned. Activity  Return to your normal activities as told by your health care provider. Ask your health care provider what activities are safe for you. ? Avoid activities that make your symptoms worse.  Take brief periods of rest throughout the day. Resting in a lying or standing position is usually better than sitting to rest. ? When you rest for longer periods, mix in some mild activity or stretching between periods of rest. This will help to prevent stiffness and pain. ? Avoid sitting for long periods of time without moving. Get up and move around at least one time each hour.  Exercise and stretch regularly, as told by your health care provider.  Do not lift anything that is heavier than 10 lb (4.5 kg) while you have symptoms of sciatica. When you do not have symptoms, you should still avoid heavy lifting, especially repetitive heavy lifting.  When you lift objects, always use proper lifting technique, which includes: ? Bending your knees. ? Keeping the load close to your body. ? Avoiding twisting. General instructions  Use good posture. ? Avoid leaning forward while sitting. ? Avoid hunching over while standing.  Maintain a healthy weight. Excess weight puts extra stress on your back and makes it difficult to maintain good posture.  Wear supportive, comfortable shoes. Avoid wearing high heels.  Avoid sleeping on a mattress that is too soft or too hard. A mattress that is firm enough to support your back when you sleep may help to reduce your pain.  Keep all follow-up visits as told by your health care provider. This is important. Contact a health care provider if:  You have pain that wakes you  up when you are sleeping.  You have pain that gets worse when you lie down.  Your pain is worse than you have experienced in the past.  Your pain lasts longer than 4 weeks.  You experience unexplained weight loss. Get help right away if:  You lose control of your bowel or bladder (incontinence).  You have: ? Weakness in your lower back, pelvis, buttocks, or legs that gets worse. ? Redness or swelling of your back. ? A burning sensation when you urinate. This information is not intended to replace advice given to you by your health care provider. Make sure you discuss any questions you have with your health care provider. Document Released: 09/25/2001 Document Revised: 03/06/2016 Document Reviewed: 06/10/2015 Elsevier Interactive Patient Education  2017 [place].   Gabapentin capsules or tablets What is this medicine? GABAPENTIN (GA ba pen tin) is used to control partial seizures in adults with epilepsy. It is also used to treat certain types of nerve pain.  This medicine may be used for other purposes; ask your health care provider or pharmacist if you have questions. COMMON BRAND NAME(S): Active-PAC with Gabapentin, Gabarone, Neurontin What should I tell my health care provider before I take this medicine? They need to know if you have any of these conditions: -kidney disease -suicidal thoughts, plans, or attempt; a previous suicide attempt by you or a family member -an unusual or allergic reaction to gabapentin, other medicines, foods, dyes, or preservatives -pregnant or trying to get pregnant -breast-feeding How should I use this medicine? Take this medicine by mouth with a glass of water. Follow the directions on the prescription label. You can take it with or without food. If it upsets your stomach, take it with food.Take your medicine at regular intervals. Do not take it more often than directed. Do not stop taking except on your doctor's advice. If you are directed to  break the 600 or 800 mg tablets in half as part of your dose, the extra half tablet should be used for the next dose. If you have not used the extra half tablet within 28 days, it should be thrown away. A special MedGuide will be given to you by the pharmacist with each prescription and refill. Be sure to read this information carefully each time. Talk to your pediatrician regarding the use of this medicine in children. Special care may be needed. Overdosage: If you think you have taken too much of this medicine contact a poison control center or emergency room at once. NOTE: This medicine is only for you. Do not share this medicine with others. What if I miss a dose? If you miss a dose, take it as soon as you can. If it is almost time for your next dose, take only that dose. Do not take double or extra doses. What may interact with this medicine? Do not take this medicine with any of the following medications: -other gabapentin products This medicine may also interact with the following medications: -alcohol -antacids -antihistamines for allergy, cough and cold -certain medicines for anxiety or sleep -certain medicines for depression or psychotic disturbances -homatropine; hydrocodone -naproxen -narcotic medicines (opiates) for pain -phenothiazines like chlorpromazine, mesoridazine, prochlorperazine, thioridazine This list may not describe all possible interactions. Give your health care provider a list of all the medicines, herbs, non-prescription drugs, or dietary supplements you use. Also tell them if you smoke, drink alcohol, or use illegal drugs. Some items may interact with your medicine. What should I watch for while using this medicine? Visit your doctor or health care professional for regular checks on your progress. You may want to keep a record at home of how you feel your condition is responding to treatment. You may want to share this information with your doctor or health care  professional at each visit. You should contact your doctor or health care professional if your seizures get worse or if you have any new types of seizures. Do not stop taking this medicine or any of your seizure medicines unless instructed by your doctor or health care professional. Stopping your medicine suddenly can increase your seizures or their severity. Wear a medical identification bracelet or chain if you are taking this medicine for seizures, and carry a card that lists all your medications. You may get drowsy, dizzy, or have blurred vision. Do not drive, use machinery, or do anything that needs mental alertness until you know how this medicine affects you. To reduce dizzy or fainting spells, do not sit  or stand up quickly, especially if you are an older patient. Alcohol can increase drowsiness and dizziness. Avoid alcoholic drinks. Your mouth may get dry. Chewing sugarless gum or sucking hard candy, and drinking plenty of water will help. The use of this medicine may increase the chance of suicidal thoughts or actions. Pay special attention to how you are responding while on this medicine. Any worsening of mood, or thoughts of suicide or dying should be reported to your health care professional right away. Women who become pregnant while using this medicine may enroll in the [place] Pregnancy Registry by calling [phone]. This registry collects information about the safety of antiepileptic drug use during pregnancy. What side effects may I notice from receiving this medicine? Side effects that you should report to your doctor or health care professional as soon as possible: -allergic reactions like skin rash, itching or hives, swelling of the face, lips, or tongue -worsening of mood, thoughts or actions of suicide or dying Side effects that usually do not require medical attention (report to your doctor or health care professional if they continue or are  bothersome): -constipation -difficulty walking or controlling muscle movements -dizziness -nausea -slurred speech -tiredness -tremors -weight gain This list may not describe all possible side effects. Call your doctor for medical advice about side effects. You may report side effects to FDA at 1-800-FDA-1088. Where should I keep my medicine? Keep out of reach of children. This medicine may cause accidental overdose and death if it taken by other adults, children, or pets. Mix any unused medicine with a substance like cat litter or coffee grounds. Then throw the medicine away in a sealed container like a sealed bag or a coffee can with a lid. Do not use the medicine after the expiration date. Store at room temperature between 15 and 30 degrees C (59 and 86 degrees F). NOTE: This sheet is a summary. It may not cover all possible information. If you have questions about this medicine, talk to your doctor, pharmacist, or health care provider.  2018 Elsevier/Gold Standard (2013-11-27 15:26:50)

## 2017-07-16 NOTE — Progress Notes (Signed)
Subjective:    I'm seeing this patient as a consultation for:  Dr. Emeterio Reeve, DO  CC: ankle pain  HPI:  History obtained from patient, daughter, and wife.   Patient reports left ankle pain for about 8 months. He initially noticed the pain after exercising on a stationary bike and hitting his ankle on a pedal. There was no swelling or bruising at the time of injury. Patient reports that pain is also present along the outer thigh, radiates down towards the lateral knee, and wraps around lower leg to the ankle and foot. Patient has not tried anything to relieve the pain. He endorses some numbness and tingling present in his lower extremities.   Patient also reports an wound on his left knee. He states it has been there for about a year, but has failed to heal during this time. His family reports that he frequently picks at the sore. They have previously tried applying antibiotic ointment to the area and covering it with a band-aid, but the patient continues to pick at the area. The sore does not itch.  Past medical history, Surgical history, Family history not pertinant except as noted below, Social history, Allergies, and medications have been entered into the medical record, reviewed, and no changes needed.   Review of Systems: No headache, visual changes, nausea, vomiting, diarrhea, constipation, dizziness, abdominal pain, skin rash, fevers, chills, night sweats, weight loss, swollen lymph nodes, body aches, joint swelling, muscle aches, chest pain, shortness of breath, mood changes, visual or auditory hallucinations.   Objective:    Vitals:   07/16/17 1538  BP: (!) 110/52  Pulse: 73  Temp: 97.7 F (36.5 C)  SpO2: 94%   General: Well Developed, well nourished, and in no acute distress.  Neuro/Psych: Alert, extra-ocular muscles intact, able to move all 4 extremities, sensation grossly intact. Skin: Warm and dry, no rashes noted. Non-healing 0.5 cm circular erosion with  surrounding crust present on superior medial aspect of knee, no drainage present Respiratory: Not using accessory muscles, speaking in full sentences, trachea midline.  Cardiovascular: Pulses palpable, no extremity edema. Abdomen: Does not appear distended. MSK:  Left ankle: No gross deformity or bruising, 1+ pitting edema Tenderness to palpation at lateral malleolus and area of talofibular ligament Range of motion is full with inversion, eversion, plantar flexion and dorsiflexion Strength is 5/5 with plantar flexion and dorsiflexion Able to ambulate using rolling walker, rises from seated position without assistance, normal gait  X-ray ankle does not show significant arthritis or acute bony changes.  X-ray lumbar spine pending  Impression and Recommendations:    Assessment and Plan: 83 y.o. male with ankle pain and wound on knee. For his ankle pain, x-rays were obtained in clinic to look for any signs of ankle arthritis. Initial read of x-rays were unremarkable and did not show signs of arthritis. Final read is pending. Given that patient's pain is localized to area innervated by L5, this may be Lumbar radiculopathy. Patient will also undergo x-ray of lumber spine in clinic today. Patient was recently started on low-dose clonazepam but has not begun taking this yet. It is reasonable to hold-off on starting any other new medications at this time. If pain continues, additional methods for pain relief should be considered, including adding a low-dose of gabapentin to be taken at bedtime.   For the non-healing area on his knee, this could be a simple non-healing wound given patient's constant picking of area. It is reasonable to try applying antibiotic  ointment regularly and covering with a band-aid for a few weeks. If area does not heal during this time, other conditions to consider include squamous cell carcinoma or basal cell carcinoma. If the wound does not heal, a shave biopsy will be  performed to further assess for the presence of these.   Orders Placed This Encounter  Procedures  . DG Ankle Complete Left    Standing Status:   Future    Number of Occurrences:   1    Standing Expiration Date:   09/15/2018    Order Specific Question:   Reason for Exam (SYMPTOM  OR DIAGNOSIS REQUIRED)    Answer:   eval left foot and leg pain    Order Specific Question:   Preferred imaging location?    Answer:   [facility]    Order Specific Question:   Radiology Contrast Protocol - do NOT remove file path    Answer:   \\[email or web address]s.pdf  . DG Foot Complete Left    Standing Status:   Future    Number of Occurrences:   1    Standing Expiration Date:   09/15/2018    Order Specific Question:   Reason for Exam (SYMPTOM  OR DIAGNOSIS REQUIRED)    Answer:   eval left foot and ankle pain    Order Specific Question:   Preferred imaging location?    Answer:   [facility]    Order Specific Question:   Radiology Contrast Protocol - do NOT remove file path    Answer:   \\[email or web address]s.pdf  . DG Lumbar Spine Complete    Standing Status:   Future    Number of Occurrences:   1    Standing Expiration Date:   09/15/2018    Order Specific Question:   Reason for Exam (SYMPTOM  OR DIAGNOSIS REQUIRED)    Answer:   eval ? Left L5 rad    Order Specific Question:   Preferred imaging location?    Answer:   [facility]    Order Specific Question:   Radiology Contrast Protocol - do NOT remove file path    Answer:   \\[email or web address]s.[email or web address]   No orders of the defined types were placed in this encounter.   Discussed warning signs or symptoms. Please see discharge instructions. Patient expresses understanding.

## 2017-07-19 ENCOUNTER — Telehealth: Payer: Self-pay | Admitting: Family Medicine

## 2017-07-19 ENCOUNTER — Telehealth: Payer: Self-pay | Admitting: Neurology

## 2017-07-19 DIAGNOSIS — J479 Bronchiectasis, uncomplicated: Secondary | ICD-10-CM | POA: Diagnosis not present

## 2017-07-19 DIAGNOSIS — R845 Abnormal microbiological findings in specimens from respiratory organs and thorax: Secondary | ICD-10-CM | POA: Diagnosis not present

## 2017-07-19 DIAGNOSIS — J984 Other disorders of lung: Secondary | ICD-10-CM | POA: Diagnosis not present

## 2017-07-19 DIAGNOSIS — I700 Atherosclerosis of aorta: Secondary | ICD-10-CM

## 2017-07-19 DIAGNOSIS — R918 Other nonspecific abnormal finding of lung field: Secondary | ICD-10-CM | POA: Diagnosis not present

## 2017-07-19 DIAGNOSIS — I708 Atherosclerosis of other arteries: Principal | ICD-10-CM

## 2017-07-19 DIAGNOSIS — B449 Aspergillosis, unspecified: Secondary | ICD-10-CM | POA: Diagnosis not present

## 2017-07-19 DIAGNOSIS — J439 Emphysema, unspecified: Secondary | ICD-10-CM | POA: Diagnosis not present

## 2017-07-19 NOTE — Addendum Note (Signed)
Addended by: Huel Cote on: 07/19/2017 10:46 AM   Modules accepted: Orders

## 2017-07-19 NOTE — Telephone Encounter (Signed)
[place] order changed per Radiology protocol.

## 2017-07-19 NOTE — Telephone Encounter (Signed)
Spoke to daughter Katharine Look regarding concerns. I discussed with her that I had written on my note that although benzos are not typically recommended in this age group, a very low dose of 0.25mg  clonazepam may help with RLS and sleep. She is understandably very concerned, especially after 2 people (pharmacist and [facility]) were "shocked" about the clonazepam. I discussed with her that we can just go ahead with tapering the ropinirole (as this can cause confusion) and see how he does with the RLS. If he does well, then potentially we don't need the clonazepam. Other option is gabapentin, which had been offered by [facility] as well. She reports difficulty with sleep maintenance and was scared of him falling while on clonazepam. He has tried melatonin in the past. I discussed with her that any sleep aid could potentially have similar side effects. She will see how he does with Requip taper and call our office in a weeks time.

## 2017-07-19 NOTE — Telephone Encounter (Signed)
Spoke with pt's daughter.  She was a little upset that I returned her call and not Dr. Delice Lesch.  She states that when she picked up pt's clonazepam the pharmacist was "shocked" that this was prescribed to "someone as old" as the pt.  Then yesterday at sports medicine, the doctor there was again "shocked" that pt was prescribed clonazepam.  Daughter states that pharmacist told her that "every one needs to stay awake all night when he starts this medication as to make sure that pt does not fall when he gets up in the middle of the night".  She says that she just wants to make sure that we are doing the very best for her father, and that we are not jeopardizing anything.  Her concern is that it will not help with pt's sleep or RLS, but will increase his risk of falls.  She would appreciate a return call from Dr. Delice Lesch at the number listed below.

## 2017-07-19 NOTE — Telephone Encounter (Signed)
Daughter calling about her father's med- clonazepam.  Needs to talks to Dr. Delice Lesch. Please call her on her cell [phone]

## 2017-07-19 NOTE — Telephone Encounter (Signed)
Aorta ultrasound ordered to evaluate possible aortic aneurysm seen on x-ray.

## 2017-07-19 NOTE — Telephone Encounter (Signed)
information discussed with daughter. See result note.

## 2017-07-22 ENCOUNTER — Telehealth: Payer: Self-pay | Admitting: Neurology

## 2017-07-22 ENCOUNTER — Ambulatory Visit (INDEPENDENT_AMBULATORY_CARE_PROVIDER_SITE_OTHER): Payer: Medicare Other

## 2017-07-22 DIAGNOSIS — I708 Atherosclerosis of other arteries: Principal | ICD-10-CM

## 2017-07-22 DIAGNOSIS — I714 Abdominal aortic aneurysm, without rupture: Secondary | ICD-10-CM | POA: Diagnosis not present

## 2017-07-22 DIAGNOSIS — Z136 Encounter for screening for cardiovascular disorders: Secondary | ICD-10-CM | POA: Diagnosis not present

## 2017-07-22 DIAGNOSIS — I700 Atherosclerosis of aorta: Secondary | ICD-10-CM | POA: Diagnosis not present

## 2017-07-22 NOTE — Telephone Encounter (Signed)
Patient's daughter called needing to speak with Dr. Delice Lesch regarding the new medication. Please Call. Thanks

## 2017-07-22 NOTE — Telephone Encounter (Signed)
Spoke to daughter. The past 3 days he has not been himself, drowsy during the day but not sleeping at night because his legs are visibly jerking and keeping him up at night. No behavioral issues on lower dose Seroquel. She is asking about starting the low dose clonazepam 0.25mg  qhs, there will be someone to keep an eye on him at night to make sure he does not get up and potentially fall, continue to monitor as we start new medication. His daughter will keep [place] updated.

## 2017-07-23 ENCOUNTER — Telehealth: Payer: Self-pay | Admitting: Neurology

## 2017-07-23 ENCOUNTER — Encounter: Payer: Self-pay | Admitting: Family Medicine

## 2017-07-23 DIAGNOSIS — I714 Abdominal aortic aneurysm, without rupture, unspecified: Secondary | ICD-10-CM

## 2017-07-23 HISTORY — DX: Abdominal aortic aneurysm, without rupture: I71.4

## 2017-07-23 HISTORY — DX: Abdominal aortic aneurysm, without rupture, unspecified: I71.40

## 2017-07-23 NOTE — Telephone Encounter (Signed)
Pt's daughter left a voicemail message asking for a call back regarding some medication he took last night

## 2017-07-23 NOTE — Telephone Encounter (Signed)
Returned call.  Spoke with pt's daughter who states that she gave pt his Clonazepam at 10PM last night, he went to bed at 10:30PM and slept on and off until 5:30AM when he laid down in his recliner and slept until 11:30AM at which point his daughter woke him to administer his AM medications.  I suggested that she give Clonazepam earlier, maybe 7 pt or 8PM and see how he does.  She inquired about Requip and Questiapine.  I told her to give those as directed, but if she notices that pt is still drowsy in the AM she can move administering those meds up as well.  I let her know that with adding a medication and removing a medication there will be a period of "trial and error" while trying to find the "perfect" timing to administer.

## 2017-07-26 DIAGNOSIS — I5022 Chronic systolic (congestive) heart failure: Secondary | ICD-10-CM | POA: Diagnosis not present

## 2017-07-26 DIAGNOSIS — J440 Chronic obstructive pulmonary disease with acute lower respiratory infection: Secondary | ICD-10-CM | POA: Diagnosis not present

## 2017-07-26 DIAGNOSIS — L03114 Cellulitis of left upper limb: Secondary | ICD-10-CM | POA: Diagnosis not present

## 2017-07-26 DIAGNOSIS — J189 Pneumonia, unspecified organism: Secondary | ICD-10-CM | POA: Diagnosis not present

## 2017-07-26 DIAGNOSIS — I10 Essential (primary) hypertension: Secondary | ICD-10-CM | POA: Diagnosis not present

## 2017-07-26 DIAGNOSIS — J441 Chronic obstructive pulmonary disease with (acute) exacerbation: Secondary | ICD-10-CM | POA: Diagnosis not present

## 2017-07-28 IMAGING — DX DG CHEST 2V
2 series · 2 of 2 positions shown · non-contrast
Comparison: None.

CLINICAL DATA: Patient with history of body aches and fever.

EXAM:
CHEST  2 VIEW

[chest pa]
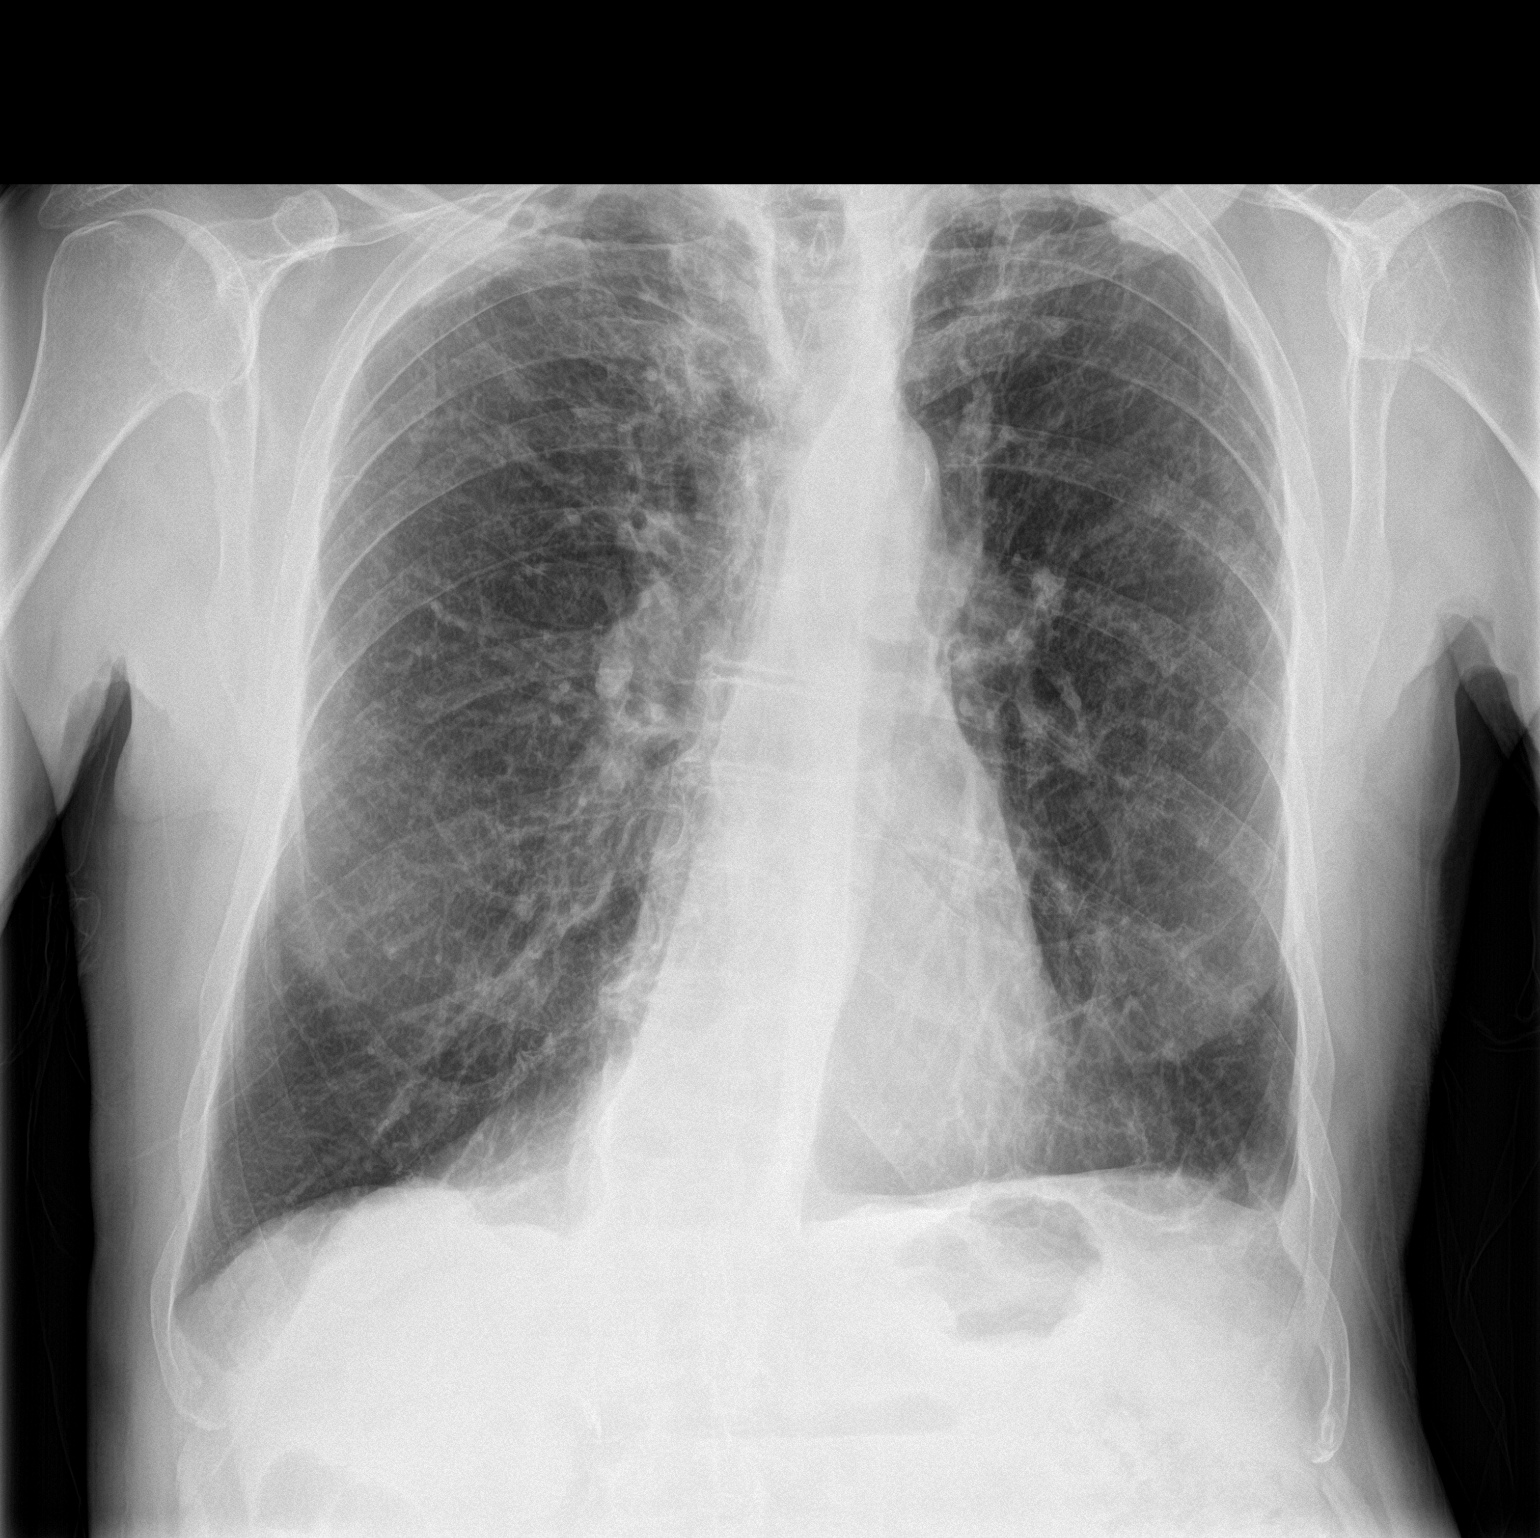

[chest lat]
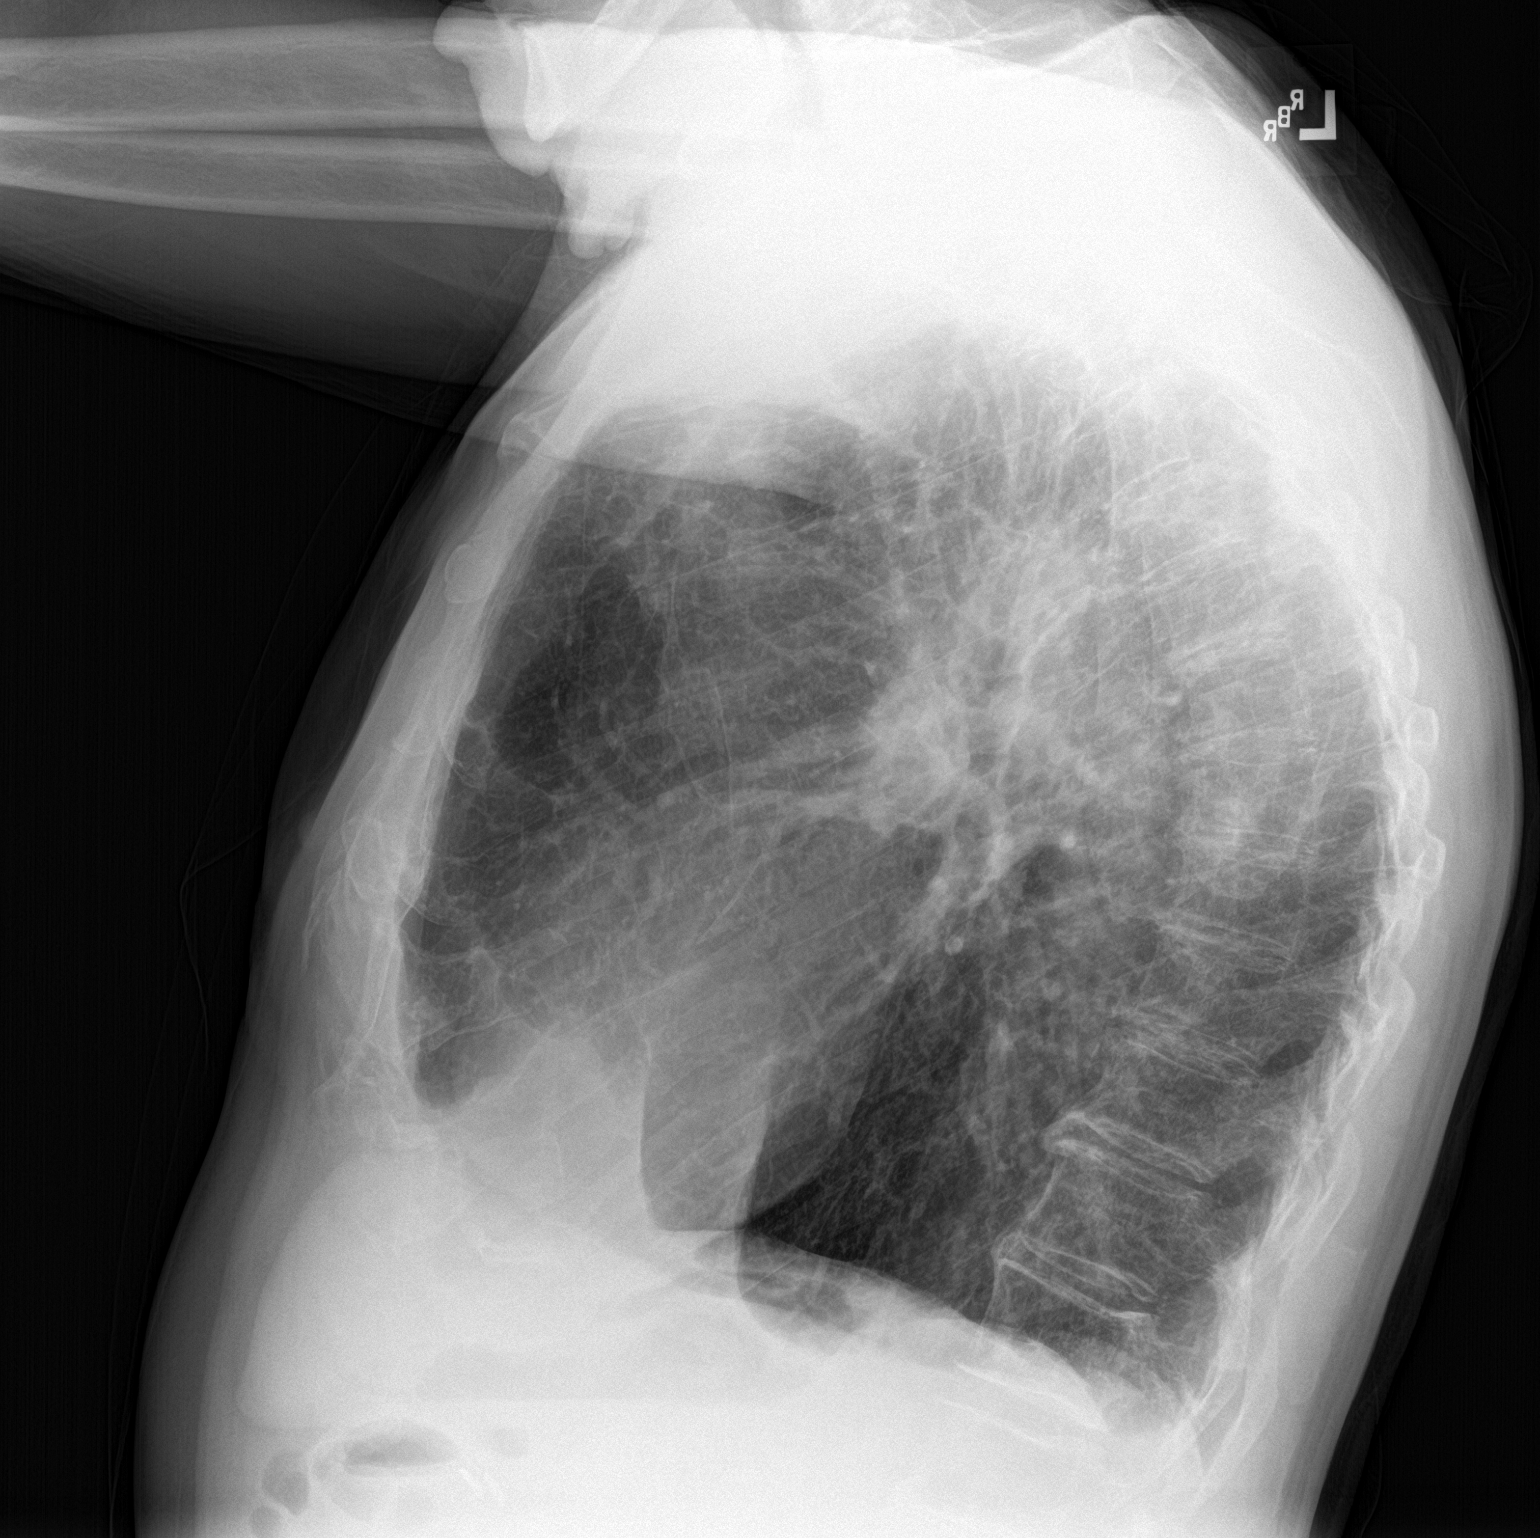

[2 of 2 positions shown; findings below may reference images not displayed]

FINDINGS: Normal cardiac and mediastinal contours. Pulmonary hyperinflation.
No large area of pulmonary consolidation. No pleural effusion or
pneumothorax. Biapical pleural parenchymal thickening and scarring.
Mid thoracic spine degenerative changes. Additionally there is a
more focal masslike opacity within the right upper hemi thorax.
IMPRESSION: More focal masslike opacity within the right upper hemi thorax is
nonspecific may be related to prior scarring. Right apical pulmonary
mass is not excluded. This needs evaluation with chest CT in the non
acute setting.

Pulmonary hyperinflation.  No acute cardiopulmonary process.

These results will be called to the ordering clinician or
representative by the Radiologist Assistant, and communication
documented in the PACS or zVision Dashboard.

## 2017-07-29 ENCOUNTER — Telehealth: Payer: Self-pay

## 2017-07-29 NOTE — Telephone Encounter (Signed)
Thanks for update

## 2017-07-29 NOTE — Telephone Encounter (Signed)
Spoke with pt's daughter, Katharine Look, who states that they starting giving pt his half tablet of Clonazepam at [facility] Friday night.  He was still getting up numerous time with visual hallucinations.  Last night (Sunday night) she gave pt 1/4 tablet at [facility].  Pt slept all night and is not groggy this morning.  She asked if giving this amount is OK, I advised her that it is, if it works for pt and family.  She had a question regarding pt Donepezil - paper work states to take 1 tablet, bottle states to take 2 tablets.  I advised that pt needs to be taking 10mg /day, so if tablets are 5mg  - he should take 2.  If tablets are 10mg  - he should take 1.  Katharine Look was at work but gave verbal understanding of instructions.

## 2017-07-29 NOTE — Telephone Encounter (Signed)
-----   Message from Cameron Sprang, MD sent at 07/29/2017  8:59 AM EDT ----- Regarding: clonazepam Any word on how he is doing? Pls check with daughter if we haven't heard back. Thanks!

## 2017-07-31 DIAGNOSIS — L03114 Cellulitis of left upper limb: Secondary | ICD-10-CM | POA: Diagnosis not present

## 2017-07-31 DIAGNOSIS — J189 Pneumonia, unspecified organism: Secondary | ICD-10-CM | POA: Diagnosis not present

## 2017-07-31 DIAGNOSIS — J440 Chronic obstructive pulmonary disease with acute lower respiratory infection: Secondary | ICD-10-CM | POA: Diagnosis not present

## 2017-07-31 DIAGNOSIS — J441 Chronic obstructive pulmonary disease with (acute) exacerbation: Secondary | ICD-10-CM | POA: Diagnosis not present

## 2017-08-08 ENCOUNTER — Encounter: Payer: Self-pay | Admitting: Emergency Medicine

## 2017-08-08 ENCOUNTER — Emergency Department (INDEPENDENT_AMBULATORY_CARE_PROVIDER_SITE_OTHER)
Admission: EM | Admit: 2017-08-08 | Discharge: 2017-08-08 | Disposition: A | Discharge status: Home / Self Care | Payer: Medicare Other | Source: Home / Self Care | Attending: Family Medicine | Admitting: Family Medicine

## 2017-08-08 DIAGNOSIS — L989 Disorder of the skin and subcutaneous tissue, unspecified: Secondary | ICD-10-CM

## 2017-08-08 MED ORDER — MUPIROCIN 2 % EX OINT: 1.0000 "application " | TOPICAL_OINTMENT | Freq: Three times a day (TID) | CUTANEOUS | 0 refills | Status: DC

## 2017-08-08 NOTE — ED Provider Notes (Signed)
[name]    CSN: [id number] Arrival date & time: 08/08/17  1844     History   Chief Complaint Chief Complaint  Patient presents with  . Cellulitis    HPI Bill Hinton is a 83 y.o. male.   Patient complains of a lesion on his right face that has been present for at least two months.  It became larger after he squeezed it, and has a persistent "scab" on its surface.  The lesion does not itch and is not painful.  He admits that there has been a small subcutaneous nodule at the same site for quite some time.   The history is provided by the patient, the spouse and a relative.  Rash  Location: right cheek. Quality: dryness, scaling and swelling   Quality: not blistering, not burning, not draining, not itchy, not painful, not peeling and not weeping   Onset quality:  Gradual Duration:  2 months Timing:  Constant Progression:  Worsening Chronicity:  Chronic Context: not exposure to similar rash, not insect bite/sting and not plant contact   Relieved by:  Nothing Worsened by:  Nothing Ineffective treatments:  None tried Associated symptoms: no fatigue, no fever and no induration     Past Medical History:  Diagnosis Date  . AAA (abdominal aortic aneurysm) ([place]) 07/23/2017   US done 07/22/17: 2.7 x 3.2 cm  Recommend followup by ultrasound in 3 years  . COPD (chronic obstructive pulmonary disease) ([place])   . Reflux   . Spontaneous pneumothorax     Patient Active Problem List   Diagnosis Date Noted  . AAA (abdominal aortic aneurysm) ([place]) 07/23/2017  . Aorto-iliac atherosclerosis ([place]) 07/19/2017  . Sepsis due to cellulitis ([place]) 05/02/2017  . Mass of upper lobe of right lung 04/09/2017  . Thrombocytopenia ([place]) 04/09/2017  . Benign prostatic hyperplasia 12/14/2016  . CAD (coronary artery disease), native coronary artery 12/14/2016  . Depression with anxiety 12/14/2016  . History of nephrolithiasis 12/14/2016  . Pneumothorax 12/14/2016  . Tinnitus  12/14/2016  . Vitamin D deficiency 12/14/2016  . Rhinorrhea 12/14/2016  . Declining functional status 09/26/2016  . Hospital-acquired pneumonia 09/19/2016  . Restless leg syndrome 09/19/2016  . Acute respiratory failure with hypoxia ([place]) 09/17/2016  . Bell's palsy 09/17/2016  . Bronchiectasis ([place]) 09/17/2016  . 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 Dysphagia 08/17/2016  . Vasomotor rhinitis 08/17/2016  . Right ureteral calculus 04/13/2015  . Atherosclerotic heart disease of native coronary artery without angina pectoris 04/11/2015  . Chronic kidney disease 04/11/2015  . Diaphragmatic hernia without obstruction or gangrene 04/11/2015  . Enlarged prostate without lower urinary tract symptoms (luts) 04/11/2015  . Essential (primary) hypertension 04/11/2015  . Nocturia 04/11/2015  . Other seasonal allergic rhinitis 04/11/2015  . Panic disorder without agoraphobia 04/11/2015  . Status post cataract extraction 01/21/2014  . Presence of intraocular lens 01/21/2014  . Pseudophakia of both eyes 01/21/2014  . Nuclear sclerosis of right eye 01/19/2014  . 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 Pterygium of left eye 05/29/2012  . Hyperlipidemia 12/30/2008  . 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 Gastroesophageal reflux disease without esophagitis 12/30/2008    Past Surgical History:  Procedure Laterality Date  . CHOLECYSTECTOMY    . HERNIA REPAIR         Home Medications    Prior to Admission medications   Medication Sig Start Date End Date Taking? Authorizing Provider  AMBULATORY NON FORMULARY  MEDICATION Disp commode chair Dx Debility and ambulatory dysfunction, CHF, COPD 04/25/17   Emeterio Reeve, DO  aspirin 81 MG chewable tablet Chew 1 tablet (81 mg total) by mouth daily. 07/05/17   Emeterio Reeve, DO  Cholecalciferol (VITAMIN D-3) 1000 units CAPS Take  1 capsule by mouth daily.    [provider]  citalopram (CELEXA) 40 MG tablet TAKE 1 TABLET(40 MG) BY MOUTH DAILY 07/08/17   Emeterio Reeve, DO  clonazePAM Bobbye [place]) 0.5 MG tablet Take 1/2 tablet 30 minutes before bedtime 07/12/17   Cameron Sprang, MD  donepezil (ARICEPT) 10 MG tablet Take 10 mg by mouth daily.    [provider]  furosemide (LASIX) 20 MG tablet Take 1 tablet (20 mg total) by mouth daily. Can take 2 tablets (40 mg) If increased swelling or weight gain 05/17/17   Emeterio Reeve, DO  guaifenesin (ROBITUSSIN) 100 MG/5ML syrup Take 10 mLs (200 mg total) by mouth 4 (four) times daily. 07/05/17   Emeterio Reeve, DO  ipratropium-albuterol (DUONEB) 0.5-2.5 (3) MG/3ML SOLN Inhale into the lungs. 12/13/15   [provider]  isosorbide mononitrate (IMDUR) 30 MG 24 hr tablet Take by mouth. 04/15/17   [provider]  lansoprazole (PREVACID) 30 MG capsule Take 1 capsule (30 mg total) by mouth daily at 12 noon. 07/05/17   Emeterio Reeve, DO  metoprolol tartrate (LOPRESSOR) 25 MG tablet Take 12.5 mg by mouth daily.    [provider]  montelukast (SINGULAIR) 10 MG tablet Take 10 mg by mouth daily. 11/16/16   [provider]  mupirocin ointment (BACTROBAN) 2 % Apply 1 application topically 3 (three) times daily. 08/08/17   Kandra Nicolas, MD  potassium chloride (KLOR-CON) 20 MEQ packet Take 20 mEq by mouth daily. 04/29/17   Emeterio Reeve, DO  predniSONE (DELTASONE) 5 MG tablet Take 3 tablets (15 mg total) by mouth daily with breakfast. Until in to see lung doctor 05/17/17   Emeterio Reeve, DO  QUEtiapine (SEROQUEL) 50 MG tablet Take 1 tablet (50 mg total) by mouth at bedtime. 07/12/17   Cameron Sprang, MD  rosuvastatin (CRESTOR) 5 MG tablet Take 1 tablet (5 mg total) by mouth at bedtime. 03/08/17   Emeterio Reeve, DO    Family History Family History  Problem Relation Age of Onset  . Heart attack Mother   . Heart attack  Father     Social History Social History  Substance Use Topics  . Smoking status: Former Research scientist (life sciences)  . Smokeless tobacco: Never Used  . Alcohol use No     Allergies   Cefepime and Hydromorphone hcl   Review of Systems Review of Systems  Constitutional: Negative for fatigue and fever.  Skin: Positive for rash.  All other systems reviewed and are negative.    Physical Exam Triage Vital Signs ED Triage Vitals  Enc Vitals Group     BP 08/08/17 1934 123/78     Pulse Rate 08/08/17 1934 100     Resp --      Temp 08/08/17 1934 97.7 F (36.5 C)     Temp Source 08/08/17 1934 Oral     SpO2 08/08/17 1934 94 %     Weight 08/08/17 1935 140 lb (63.5 kg)     Height --      Head Circumference --      Peak Flow --      Pain Score 08/08/17 1935 2     Pain Loc --      Pain Edu? --  Excl. in GC? --    No data found.   Updated Vital Signs BP 123/78 (BP Location: Left Arm)   Pulse 100   Temp 97.7 F (36.5 C) (Oral)   Wt 140 lb (63.5 kg)   SpO2 94%   BMI 19.53 kg/m   Visual Acuity Right Eye Distance:   Left Eye Distance:   Bilateral Distance:    Right Eye Near:   Left Eye Near:    Bilateral Near:     Physical Exam  Constitutional: He appears well-developed and well-nourished. No distress.  HENT:  Head: Normocephalic.    Right Ear: External ear normal.  Left Ear: External ear normal.  Nose: Nose normal.  Mouth/Throat: Oropharynx is clear and moist.  Right cheek has a 1.8cm diameter raised annular erythematous lesion with "rolled" border and central eschar.  No tenderness to palpation.  Minimal surrounding erythema.  Eyes: Pupils are equal, round, and reactive to light. Conjunctivae are normal.  Cardiovascular: Normal rate.   Pulmonary/Chest: Effort normal.  Neurological: He is alert.  Skin: Skin is warm and dry.  Nursing note and vitals reviewed.    UC Treatments / Results  Labs (all labs ordered are listed, but only abnormal results are displayed) Labs  Reviewed - No data to display  EKG  EKG Interpretation None       Radiology No results found.  Procedures Procedures (including critical care time)  Medications Ordered in UC Medications - No data to display   Initial Impression / Assessment and Plan / UC Course  I have reviewed the triage vital signs and the nursing notes.  Pertinent labs & imaging results that were available during my care of the patient were reviewed by me and considered in my medical decision making (see chart for details).    Lesion right face most likely basal cell carcinoma.  Recommend follow-up as soon as possible with dermatologist or plastic surgeon for excisional biopsy and MOHS surgery. Rx for empiric Mupirocin ointment TID.   Final Clinical Impressions(s) / UC Diagnoses   Final diagnoses:  Lesion of skin of face    New Prescriptions New Prescriptions   MUPIROCIN OINTMENT (BACTROBAN) 2 %    Apply 1 application topically 3 (three) times daily.         Kandra Nicolas, MD 08/11/17 1524

## 2017-08-08 NOTE — ED Triage Notes (Signed)
Rt side face cellulitis x 2 months

## 2017-08-09 ENCOUNTER — Telehealth: Payer: Self-pay

## 2017-08-09 NOTE — Telephone Encounter (Signed)
Please be sure family keeps his face moisturized, as dry skin can contribute to this.   They can add an extra half tablet of seroquel or clonazepam in the morning to help with his agitation and hallucinations, but it may make him sleepy.   Jadon Ressler K. Posey Pronto, DO

## 2017-08-09 NOTE — Telephone Encounter (Signed)
Spoke with Katharine Look, relaying message below.  She states that she will increase his Seroquel for now as his Clonazepam "completely knocks him out" even with just 1/4 tablet.  I advised that she call us either Monday or Tuesday to give [place] an update as to how pt handles the increase.

## 2017-08-09 NOTE — Telephone Encounter (Signed)
Received call from pt's daughter, Katharine Look, who states that she had to take pt to Urgent Care last night due to him picking the skin on his face - causing open sores.  She says that pt feels as though "worms are crawling out of his face".  (He is noted to have hallucinations). Family have attempted to give pt "fidget" toys for picking, but pt is fixated on his facial skin.  Katharine Look is concerned that this is due to his medications or lack of medications and is wanting to know if something should be added or taken away from his medications.  Please advise.   She also states that pt is drowsy during the day and suggests that this is due to the 1/4 tablet of 0.5 mg Clonazepam he is given each night.  She did state that pt sleeps all night, in no longer roaming the house at night, and his RLS has settled.  I advised that it is most likely not the Clonazepam, however could just be due to pt's age.

## 2017-08-12 DIAGNOSIS — L570 Actinic keratosis: Secondary | ICD-10-CM | POA: Diagnosis not present

## 2017-08-12 DIAGNOSIS — D485 Neoplasm of uncertain behavior of skin: Secondary | ICD-10-CM | POA: Diagnosis not present

## 2017-08-12 DIAGNOSIS — C44329 Squamous cell carcinoma of skin of other parts of face: Secondary | ICD-10-CM | POA: Diagnosis not present

## 2017-08-12 DIAGNOSIS — L820 Inflamed seborrheic keratosis: Secondary | ICD-10-CM | POA: Diagnosis not present

## 2017-08-16 ENCOUNTER — Ambulatory Visit: Payer: Medicare Other | Admitting: Family Medicine

## 2017-08-19 DIAGNOSIS — I082 Rheumatic disorders of both aortic and tricuspid valves: Secondary | ICD-10-CM | POA: Diagnosis not present

## 2017-08-19 DIAGNOSIS — I519 Heart disease, unspecified: Secondary | ICD-10-CM | POA: Diagnosis not present

## 2017-08-21 ENCOUNTER — Ambulatory Visit (INDEPENDENT_AMBULATORY_CARE_PROVIDER_SITE_OTHER): Payer: Medicare Other

## 2017-08-21 ENCOUNTER — Encounter: Payer: Self-pay | Admitting: Osteopathic Medicine

## 2017-08-21 ENCOUNTER — Ambulatory Visit (INDEPENDENT_AMBULATORY_CARE_PROVIDER_SITE_OTHER): Payer: Medicare Other | Admitting: Osteopathic Medicine

## 2017-08-21 VITALS — BP 124/67 | HR 65 | Temp 97.5°F | Resp 16 | Wt 146.3 lb

## 2017-08-21 DIAGNOSIS — I70299 Other atherosclerosis of native arteries of extremities, unspecified extremity: Secondary | ICD-10-CM

## 2017-08-21 DIAGNOSIS — K224 Dyskinesia of esophagus: Secondary | ICD-10-CM | POA: Diagnosis not present

## 2017-08-21 DIAGNOSIS — J42 Unspecified chronic bronchitis: Secondary | ICD-10-CM | POA: Diagnosis not present

## 2017-08-21 DIAGNOSIS — G8929 Other chronic pain: Secondary | ICD-10-CM

## 2017-08-21 DIAGNOSIS — I700 Atherosclerosis of aorta: Secondary | ICD-10-CM | POA: Diagnosis not present

## 2017-08-21 DIAGNOSIS — M791 Myalgia, unspecified site: Secondary | ICD-10-CM | POA: Diagnosis not present

## 2017-08-21 DIAGNOSIS — R05 Cough: Secondary | ICD-10-CM | POA: Diagnosis not present

## 2017-08-21 DIAGNOSIS — M150 Primary generalized (osteo)arthritis: Secondary | ICD-10-CM

## 2017-08-21 DIAGNOSIS — M5416 Radiculopathy, lumbar region: Secondary | ICD-10-CM | POA: Diagnosis not present

## 2017-08-21 DIAGNOSIS — M25572 Pain in left ankle and joints of left foot: Secondary | ICD-10-CM

## 2017-08-21 DIAGNOSIS — J449 Chronic obstructive pulmonary disease, unspecified: Secondary | ICD-10-CM | POA: Diagnosis not present

## 2017-08-21 DIAGNOSIS — F0390 Unspecified dementia without behavioral disturbance: Secondary | ICD-10-CM

## 2017-08-21 DIAGNOSIS — R110 Nausea: Secondary | ICD-10-CM

## 2017-08-21 DIAGNOSIS — M159 Polyosteoarthritis, unspecified: Secondary | ICD-10-CM

## 2017-08-21 DIAGNOSIS — R059 Cough, unspecified: Secondary | ICD-10-CM

## 2017-08-21 MED ORDER — ONDANSETRON HCL 8 MG PO TABS: 8.0000 mg | ORAL_TABLET | Freq: Three times a day (TID) | ORAL | 1 refills | Status: DC | PRN

## 2017-08-21 MED ORDER — ACETAMINOPHEN 500 MG PO TABS
500.0000 mg | ORAL_TABLET | Freq: Four times a day (QID) | ORAL | 1 refills | Status: DC | PRN
Start: 2017-08-21 — End: 2018-08-22

## 2017-08-21 NOTE — Progress Notes (Signed)
HPI: Bill Hinton is a 83 y.o. male with PMH  has a past medical history of AAA (abdominal aortic aneurysm) ([place]) (07/23/2017), COPD (chronic obstructive pulmonary disease) ([place]), Reflux, and Spontaneous pneumothorax.  who presents to [facility] today, 08/21/17,  for chief complaint of:  Chief Complaint  Patient presents with  . URI    Nasal/cough - white;SOB - little;wheezing - little x 2 dys    Coughing, fatigue. Production clear/white sputum. 2 days ago started, per wife. No fever. Has had flu shot this season and UTD on PNA vaccines. His greatest complaint is aching all over and nausea. No vomiting, no diarrhea. Wife states he's not coughing or not having much more sinus congestion than usual.   Patient's greatest concern today is chronic aches and pains, leg, R arm, upper abdomen. Nausea without vomiting. These are longstanding issues. He is only taking OTC Tylenol intermittently for pain.   Wife, Glenda's, greatest concern today is ruling out serious illness such as pneumonia, as he is prone to infections. She reports dementia  COPD - has appt w/ pulmonology in 2 days. On steroids 15 mg daily, compliant with nebulizer bid Try nausea as noted below.   Patient is accompanied by wife who assists with history-taking.   Past medical, surgical, social and family history reviewed:  Patient Active Problem List   Diagnosis Date Noted  . AAA (abdominal aortic aneurysm) ([place]) 07/23/2017  . Aorto-iliac atherosclerosis ([place]) 07/19/2017  . Sepsis due to cellulitis ([place]) 05/02/2017  . Mass of upper lobe of right lung 04/09/2017  . Thrombocytopenia ([place]) 04/09/2017  . Benign prostatic hyperplasia 12/14/2016  . CAD (coronary artery disease), native coronary artery 12/14/2016  . Depression with anxiety 12/14/2016  . History of nephrolithiasis 12/14/2016  . Pneumothorax 12/14/2016  . Tinnitus 12/14/2016  . Vitamin D deficiency 12/14/2016  . Rhinorrhea  12/14/2016  . Declining functional status 09/26/2016  . Hospital-acquired pneumonia 09/19/2016  . Restless leg syndrome 09/19/2016  . Acute respiratory failure with hypoxia ([place]) 09/17/2016  . Bell's palsy 09/17/2016  . Bronchiectasis ([place]) 09/17/2016  . Mounier-Kuhn bronchiectasis with acute exacerbation ([place]) 08/17/2016  . Dysphagia 08/17/2016  . Vasomotor rhinitis 08/17/2016  . Right ureteral calculus 04/13/2015  . Atherosclerotic heart disease of native coronary artery without angina pectoris 04/11/2015  . Chronic kidney disease 04/11/2015  . Diaphragmatic hernia without obstruction or gangrene 04/11/2015  . Enlarged prostate without lower urinary tract symptoms (luts) 04/11/2015  . Essential (primary) hypertension 04/11/2015  . Nocturia 04/11/2015  . Other seasonal allergic rhinitis 04/11/2015  . Panic disorder without agoraphobia 04/11/2015  . Status post cataract extraction 01/21/2014  . Presence of intraocular lens 01/21/2014  . Pseudophakia of both eyes 01/21/2014  . Nuclear sclerosis of right eye 01/19/2014  . 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 Pterygium of left eye 05/29/2012  . Hyperlipidemia 12/30/2008  . Steroid-dependent chronic obstructive pulmonary disease ([place]) 12/30/2008  . Gastroesophageal reflux disease without esophagitis 12/30/2008    Past Surgical History:  Procedure Laterality Date  . CHOLECYSTECTOMY    . HERNIA REPAIR      Social History   Tobacco Use  . Smoking status: Former Research scientist (life sciences)  . Smokeless tobacco: Never Used  Substance Use Topics  . Alcohol use: No    Family History  Problem Relation Age of Onset  . Heart attack Mother   . Heart attack Father      Current medication list and allergy/intolerance information reviewed:  Current Outpatient Medications  Medication Sig Dispense Refill  . AMBULATORY NON FORMULARY MEDICATION Disp commode chair Dx Debility and ambulatory dysfunction,  CHF, COPD 1 Units prn  . aspirin 81 MG chewable tablet Chew 1 tablet (81 mg total) by mouth daily. 90 tablet 3  . Cholecalciferol (VITAMIN D-3) 1000 units CAPS Take 1 capsule by mouth daily.    . citalopram (CELEXA) 40 MG tablet TAKE 1 TABLET(40 MG) BY MOUTH DAILY 90 tablet 0  . clonazePAM (KLONOPIN) 0.5 MG tablet Take 1/2 tablet 30 minutes before bedtime 15 tablet 5  . donepezil (ARICEPT) 10 MG tablet Take 10 mg by mouth daily.    . furosemide (LASIX) 20 MG tablet Take 1 tablet (20 mg total) by mouth daily. Can take 2 tablets (40 mg) If increased swelling or weight gain 90 tablet 1  . guaifenesin (ROBITUSSIN) 100 MG/5ML syrup Take 10 mLs (200 mg total) by mouth 4 (four) times daily. 3840 mL 1  . ipratropium-albuterol (DUONEB) 0.5-2.5 (3) MG/3ML SOLN Inhale into the lungs.    . lansoprazole (PREVACID) 30 MG capsule Take 1 capsule (30 mg total) by mouth daily at 12 noon. 90 capsule 3  . metoprolol tartrate (LOPRESSOR) 25 MG tablet Take 12.5 mg by mouth daily.    . montelukast (SINGULAIR) 10 MG tablet Take 10 mg by mouth daily.  11  . mupirocin ointment (BACTROBAN) 2 % Apply 1 application topically 3 (three) times daily. 15 g 0  . potassium chloride (KLOR-CON) 20 MEQ packet Take 20 mEq by mouth daily. 50 packet 5  . predniSONE (DELTASONE) 5 MG tablet Take 3 tablets (15 mg total) by mouth daily with breakfast. Until in to see lung doctor 30 tablet 0  . QUEtiapine (SEROQUEL) 50 MG tablet Take 1 tablet (50 mg total) by mouth at bedtime. 30 tablet 6  . rosuvastatin (CRESTOR) 5 MG tablet Take 1 tablet (5 mg total) by mouth at bedtime. 90 tablet 3  . acetaminophen (TYLENOL) 500 MG tablet Take 1-2 tablets (500-1,000 mg total) every 6 (six) hours as needed by mouth. 90 tablet 1  . ondansetron (ZOFRAN) 8 MG tablet Take 1 tablet (8 mg total) every 8 (eight) hours as needed by mouth for nausea or vomiting. 30 tablet 1  . tamsulosin (FLOMAX) 0.4 MG CAPS capsule Take 1 capsule daily by mouth.     No current  facility-administered medications for this visit.     Allergies  Allergen Reactions  . Cefepime Other (See Comments)    Myoclonus  . Hydromorphone Hcl       Review of Systems:  Constitutional:  No  fever, no chills, +recent illness, No unintentional weight changes. +significant fatigue.   HEENT: No  headache, no vision change, no hearing change, No sore throat, +sinus pressure  Cardiac: No  chest pain, No  pressure, No palpitations  Respiratory:  No  shortness of breath. +Cough  Gastrointestinal: No  abdominal pain, +nausea, No  vomiting,  No  blood in stool, No  diarrhea  Musculoskeletal: +generalized myalgia/arthralgia  Skin: No  Rash  Neurologic: No  weakness, No  dizziness   Exam:  BP 124/67 (BP Location: Left Arm, Patient Position: Sitting, Cuff Size: Large)   Pulse 65   Temp (!) 97.5 F (36.4 C) (Oral)   Resp 16   Wt 146 lb 4.8 oz (66.4 kg)   SpO2 96%   BMI 20.40 kg/m   Constitutional: VS see above. General Appearance: alert, well-developed, well-nourished, NAD  Eyes: Normal lids and  conjunctive, non-icteric sclera  Ears, Nose, Mouth, Throat: MMM, Normal external inspection ears/nares/mouth/lips/gums. TM normal bilaterally. Pharynx/tonsils no erythema, no exudate. Nasal mucosa normal.   Neck: No masses, trachea midline. No thyroid enlargement. No tenderness/mass appreciated. No lymphadenopathy  Respiratory: Normal respiratory effort. no wheeze, no rhonchi, no rales. Some coarse breath sounds very bases of lungs cw atelectasis more than edema/effusion  Cardiovascular: S1/S2 normal, no murmur, no rub/gallop auscultated. RRR.   Musculoskeletal: Gait normal.   Neurological: Normal balance/coordination. No tremor.   Skin: warm, dry   CXR on personal review, on pneumonia or infiltrate/mass, appears stable from previous exam, await radiology over-read.     ASSESSMENT/PLAN:   Per patient's concerns, he has hx esophageal motility issues, nausea is  nothing new but he is unmedicated for this, will trial Zofran and consider GI referral for scope if no better. Rx for Tylenol, consider NSAID if needed  Per wife's concerns, I see no pneumonia on CXR and lungs sound ok, will call if plan changes based on radiology review of CXR and will see what pulm has to say. Aches/pains and nausea may also be prodrome of viral illness, will monitor closely.   Steroid-dependent chronic obstructive pulmonary disease ([place]) - Plan: DG Chest 2 View  Cough - Plan: DG Chest 2 View  Myalgia - Plan: acetaminophen (TYLENOL) 500 MG tablet  Dementia without behavioral disturbance, unspecified dementia type  Lumbar radiculitis - Plan: acetaminophen (TYLENOL) 500 MG tablet  Chronic pain of left ankle - Plan: acetaminophen (TYLENOL) 500 MG tablet  Esophageal dysfunction - Plan: ondansetron (ZOFRAN) 8 MG tablet  Primary osteoarthritis involving multiple joints - Plan: ondansetron (ZOFRAN) 8 MG tablet  Nausea - Plan: ondansetron (ZOFRAN) 8 MG tablet    Patient Instructions  Plan: I'll let you know if any pneumonia  Try the tylenol for aches/pain Try nausea medicine (Zofran) as needed     Visit summary with medication list and pertinent instructions was printed for patient to review. All questions at time of visit were answered - patient instructed to contact office with any additional concerns. ER/RTC precautions were reviewed with the patient. Follow-up plan: Return if symptoms worsen or fail to improve.  Note: Total time spent 25 minutes, greater than 50% of the visit was spent face-to-face counseling and coordinating care for the following: The primary encounter diagnosis was Steroid-dependent chronic obstructive pulmonary disease ([place]). Diagnoses of Cough, Myalgia, Dementia without behavioral disturbance, unspecified dementia type, Lumbar radiculitis, Chronic pain of left ankle, Esophageal dysfunction, Primary osteoarthritis involving multiple joints, and  Nausea were also pertinent to this visit.Marland Kitchen  Please note: voice recognition software was used to produce this document, and typos may escape review. Please contact me for any needed clarifications.

## 2017-08-21 NOTE — Patient Instructions (Addendum)
Plan: I'll let you know if any pneumonia  Try the tylenol for aches/pain Try nausea medicine (Zofran) as needed

## 2017-08-23 ENCOUNTER — Other Ambulatory Visit: Payer: Self-pay

## 2017-08-30 DIAGNOSIS — J479 Bronchiectasis, uncomplicated: Secondary | ICD-10-CM | POA: Diagnosis not present

## 2017-08-30 DIAGNOSIS — J449 Chronic obstructive pulmonary disease, unspecified: Secondary | ICD-10-CM | POA: Diagnosis not present

## 2017-08-30 DIAGNOSIS — R918 Other nonspecific abnormal finding of lung field: Secondary | ICD-10-CM | POA: Diagnosis not present

## 2017-08-30 DIAGNOSIS — B449 Aspergillosis, unspecified: Secondary | ICD-10-CM | POA: Diagnosis not present

## 2017-09-02 DIAGNOSIS — C44329 Squamous cell carcinoma of skin of other parts of face: Secondary | ICD-10-CM | POA: Diagnosis not present

## 2017-09-11 DIAGNOSIS — R845 Abnormal microbiological findings in specimens from respiratory organs and thorax: Secondary | ICD-10-CM | POA: Diagnosis not present

## 2017-09-11 DIAGNOSIS — Z7952 Long term (current) use of systemic steroids: Secondary | ICD-10-CM | POA: Diagnosis not present

## 2017-09-11 DIAGNOSIS — R1312 Dysphagia, oropharyngeal phase: Secondary | ICD-10-CM | POA: Diagnosis not present

## 2017-09-11 DIAGNOSIS — J449 Chronic obstructive pulmonary disease, unspecified: Secondary | ICD-10-CM | POA: Diagnosis not present

## 2017-09-11 DIAGNOSIS — J479 Bronchiectasis, uncomplicated: Secondary | ICD-10-CM | POA: Diagnosis not present

## 2017-09-11 DIAGNOSIS — I5022 Chronic systolic (congestive) heart failure: Secondary | ICD-10-CM | POA: Diagnosis not present

## 2017-09-17 DIAGNOSIS — J479 Bronchiectasis, uncomplicated: Secondary | ICD-10-CM | POA: Diagnosis not present

## 2017-09-17 DIAGNOSIS — R845 Abnormal microbiological findings in specimens from respiratory organs and thorax: Secondary | ICD-10-CM | POA: Diagnosis not present

## 2017-09-26 DIAGNOSIS — L910 Hypertrophic scar: Secondary | ICD-10-CM | POA: Diagnosis not present

## 2017-10-04 ENCOUNTER — Encounter: Payer: Self-pay | Admitting: Osteopathic Medicine

## 2017-10-04 ENCOUNTER — Ambulatory Visit (INDEPENDENT_AMBULATORY_CARE_PROVIDER_SITE_OTHER): Payer: Medicare Other | Admitting: Osteopathic Medicine

## 2017-10-04 VITALS — BP 119/69 | HR 93 | Wt 146.7 lb

## 2017-10-04 DIAGNOSIS — M25572 Pain in left ankle and joints of left foot: Secondary | ICD-10-CM

## 2017-10-04 DIAGNOSIS — J449 Chronic obstructive pulmonary disease, unspecified: Secondary | ICD-10-CM

## 2017-10-04 DIAGNOSIS — I700 Atherosclerosis of aorta: Secondary | ICD-10-CM

## 2017-10-04 DIAGNOSIS — M25562 Pain in left knee: Secondary | ICD-10-CM | POA: Diagnosis not present

## 2017-10-04 DIAGNOSIS — I5022 Chronic systolic (congestive) heart failure: Secondary | ICD-10-CM | POA: Diagnosis not present

## 2017-10-04 DIAGNOSIS — R7989 Other specified abnormal findings of blood chemistry: Secondary | ICD-10-CM | POA: Diagnosis not present

## 2017-10-04 DIAGNOSIS — G8929 Other chronic pain: Secondary | ICD-10-CM

## 2017-10-04 DIAGNOSIS — I70299 Other atherosclerosis of native arteries of extremities, unspecified extremity: Secondary | ICD-10-CM

## 2017-10-04 DIAGNOSIS — C4490 Unspecified malignant neoplasm of skin, unspecified: Secondary | ICD-10-CM | POA: Diagnosis not present

## 2017-10-04 LAB — BASIC METABOLIC PANEL WITH GFR
BUN: 17 mg/dL (ref 7–25)
CALCIUM: 9.5 mg/dL (ref 8.6–10.3)
CO2: 29 mmol/L (ref 20–32)
CREATININE: 1.08 mg/dL (ref 0.70–1.11)
Chloride: 106 mmol/L (ref 98–110)
GFR, EST AFRICAN AMERICAN: 73 mL/min/{1.73_m2} (ref >=60)
GFR, EST NON AFRICAN AMERICAN: 63 mL/min/{1.73_m2} (ref >=60)
Glucose, Bld: 76 mg/dL (ref 65–99)
Potassium: 3.3 mmol/L — ABNORMAL LOW (ref 3.5–5.3)
Sodium: 145 mmol/L (ref 135–146)

## 2017-10-04 NOTE — Progress Notes (Signed)
HPI: Bill Hinton is a 83 y.o. male who  has a past medical history of AAA (abdominal aortic aneurysm) ([place]) (07/23/2017), COPD (chronic obstructive pulmonary disease) ([place]), Reflux, and Spontaneous pneumothorax.  he presents to [facility] today, 10/04/17,  for chief complaint of:  Chief Complaint  Patient presents with  . Follow-up - routine check chronic medical issues.     COPD: Doing well, no recent flares. Breathing okay today. Minimal cough, stable.  CHF: Compliant with medications as noted below, no lower extremity swelling or shortness of breath.  Left ankle and knee pain: Recently saw Dr. Georgina Snell for ankle/lower back issues. At that point knee pain was described as a scab on the needed. Seem to be healing but patient states it is more in the joint.  Also following with dermatology, recent removal of cancerous lesion on the right side of the face.     Past medical, surgical, social and family history reviewed:  Patient Active Problem List   Diagnosis Date Noted  . AAA (abdominal aortic aneurysm) ([place]) 07/23/2017  . Aorto-iliac atherosclerosis ([place]) 07/19/2017  . Sepsis due to cellulitis ([place]) 05/02/2017  . Mass of upper lobe of right lung 04/09/2017  . Thrombocytopenia ([place]) 04/09/2017  . Benign prostatic hyperplasia 12/14/2016  . CAD (coronary artery disease), native coronary artery 12/14/2016  . Depression with anxiety 12/14/2016  . History of nephrolithiasis 12/14/2016  . Pneumothorax 12/14/2016  . Tinnitus 12/14/2016  . Vitamin D deficiency 12/14/2016  . Rhinorrhea 12/14/2016  . Declining functional status 09/26/2016  . Hospital-acquired pneumonia 09/19/2016  . Restless leg syndrome 09/19/2016  . Acute respiratory failure with hypoxia ([place]) 09/17/2016  . Bell's palsy 09/17/2016  . Bronchiectasis ([place]) 09/17/2016  . Mounier-Kuhn bronchiectasis with acute exacerbation ([place]) 08/17/2016  . Dysphagia 08/17/2016  . Vasomotor  rhinitis 08/17/2016  . Right ureteral calculus 04/13/2015  . Atherosclerotic heart disease of native coronary artery without angina pectoris 04/11/2015  . Chronic kidney disease 04/11/2015  . Diaphragmatic hernia without obstruction or gangrene 04/11/2015  . Enlarged prostate without lower urinary tract symptoms (luts) 04/11/2015  . Essential (primary) hypertension 04/11/2015  . Nocturia 04/11/2015  . Other seasonal allergic rhinitis 04/11/2015  . Panic disorder without agoraphobia 04/11/2015  . Status post cataract extraction 01/21/2014  . Presence of intraocular lens 01/21/2014  . Pseudophakia of both eyes 01/21/2014  . Nuclear sclerosis of right eye 01/19/2014  . 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 Pterygium of left eye 05/29/2012  . Hyperlipidemia 12/30/2008  . Steroid-dependent chronic obstructive pulmonary disease ([place]) 12/30/2008  . Gastroesophageal reflux disease without esophagitis 12/30/2008    Past Surgical History:  Procedure Laterality Date  . CHOLECYSTECTOMY    . HERNIA REPAIR      Social History   Tobacco Use  . Smoking status: Former Smoker    Last attempt to quit: 10/15/1998    Years since quitting: 18.9  . Smokeless tobacco: Never Used  Substance Use Topics  . Alcohol use: No    Family History  Problem Relation Age of Onset  . Heart attack Mother   . Heart attack Father      Current medication list and allergy/intolerance information reviewed:    Current Outpatient Medications  Medication Sig Dispense Refill  . acetaminophen (TYLENOL) 500 MG tablet Take 1-2 tablets (500-1,000 mg total) every 6 (six) hours as needed by mouth. 90 tablet 1  . AMBULATORY NON FORMULARY MEDICATION Disp commode chair Dx Debility and ambulatory  dysfunction, CHF, COPD 1 Units prn  . aspirin 81 MG chewable tablet Chew 1 tablet (81 mg total) by mouth daily. 90 tablet 3  . Cholecalciferol (VITAMIN D-3) 1000 units CAPS Take 1  capsule by mouth daily.    . citalopram (CELEXA) 40 MG tablet TAKE 1 TABLET(40 MG) BY MOUTH DAILY 90 tablet 0  . clonazePAM (KLONOPIN) 0.5 MG tablet Take 1/2 tablet 30 minutes before bedtime 15 tablet 5  . donepezil (ARICEPT) 10 MG tablet Take 10 mg by mouth daily.    . furosemide (LASIX) 20 MG tablet Take 1 tablet (20 mg total) by mouth daily. Can take 2 tablets (40 mg) If increased swelling or weight gain 90 tablet 1  . ipratropium-albuterol (DUONEB) 0.5-2.5 (3) MG/3ML SOLN Inhale into the lungs.    . lansoprazole (PREVACID) 30 MG capsule Take 1 capsule (30 mg total) by mouth daily at 12 noon. 90 capsule 3  . metoprolol tartrate (LOPRESSOR) 25 MG tablet Take 12.5 mg by mouth daily.    . montelukast (SINGULAIR) 10 MG tablet Take 10 mg by mouth daily.  11  . potassium chloride (KLOR-CON) 20 MEQ packet Take 20 mEq by mouth daily. 50 packet 5  . predniSONE (DELTASONE) 5 MG tablet Take 3 tablets (15 mg total) by mouth daily with breakfast. Until in to see lung doctor 30 tablet 0  . QUEtiapine (SEROQUEL) 50 MG tablet Take 1 tablet (50 mg total) by mouth at bedtime. 30 tablet 6  . rosuvastatin (CRESTOR) 5 MG tablet Take 1 tablet (5 mg total) by mouth at bedtime. 90 tablet 3  . ondansetron (ZOFRAN) 8 MG tablet Take 1 tablet (8 mg total) every 8 (eight) hours as needed by mouth for nausea or vomiting. (Patient not taking: Reported on 10/04/2017) 30 tablet 1   No current facility-administered medications for this visit.     Allergies  Allergen Reactions  . Fluticasone-Salmeterol Anaphylaxis and Other (See Comments)    Other reaction(s): Other (See Comments) Blisters in mouth Blisters in mouth   . Cefepime Other (See Comments)    Myoclonus  . Hydromorphone Hcl   . Theophylline     Other reaction(s): Other (See Comments) Throat irritation      Review of Systems:  Constitutional:  No  fever, no chills, No recent illness, No unintentional weight changes.    HEENT: No  headache, no vision  change  Cardiac: No  chest pain, No  pressure, No palpitations, No  Orthopnea  Respiratory:  No  shortness of breath. No  Cough  Gastrointestinal: No  abdominal pain, No  nausea  Neurologic: No  dizziness   Exam:  BP 119/69   Pulse 93   Wt 146 lb 11.2 oz (66.5 kg)   BMI 20.46 kg/m   Constitutional: VS see above. General Appearance: alert, well-developed, well-nourished, NAD  Eyes: Normal lids and conjunctive, non-icteric sclera  Ears, Nose, Mouth, Throat: MMM, Normal external inspection ears/nares/mouth/lips/gums.   Neck: No masses, trachea midline.   Respiratory: Normal respiratory effort. no wheeze, no rhonchi, no rales  Cardiovascular: S1/S2 normal, no murmur, no rub/gallop auscultated. RRR. No lower extremity edema.  Musculoskeletal: Gait normal. No clubbing/cyanosis of digits.   Neurological: Normal balance/coordination. No tremor.    Skin: warm, dry   Psychiatric: Normal mood and affect.     ASSESSMENT/PLAN:   Elevated serum creatinine - Stable until last checked, will get repeat labs today. - Plan: BASIC METABOLIC PANEL WITH GFR  Chronic systolic congestive heart failure (HCC) - Clinically  stable, continue medications as below  Chronic pain of left ankle - Stable at this point, recommend follow-up with Dr. Georgina Snell  Steroid-dependent chronic obstructive pulmonary disease ([place]) - Lungs sound clear today, no significant respiratory complaints. Continue to monitor  Chronic pain of left knee - On record review, this wasn't deeply addressed at last visit with sports med given other issues with ankle/low back. Recommended follow-up with Dr. Georgina Snell  Skin cancer - Following with dermatology      Visit summary with medication list and pertinent instructions was printed for patient to review. All questions at time of visit were answered - patient instructed to contact office with any additional concerns. ER/RTC precautions were reviewed with the patient.    Follow-up plan: Return in about 4 months (around 02/02/2018) for RECHECK CHRONIC MEDICAL ISSUES - SOONER IF NEEDED .  Note: Total time spent 25 minutes, greater than 50% of the visit was spent face-to-face counseling and coordinating care for the following: The primary encounter diagnosis was Elevated serum creatinine. Diagnoses of Chronic systolic congestive heart failure (HCC), Chronic pain of left ankle, Steroid-dependent chronic obstructive pulmonary disease ([place]), and Chronic pain of left knee were also pertinent to this visit.Marland Kitchen  Please note: voice recognition software was used to produce this document, and typos may escape review. Please contact Dr. Sheppard Coil for any needed clarifications.

## 2017-10-10 ENCOUNTER — Other Ambulatory Visit: Payer: Self-pay | Admitting: Osteopathic Medicine

## 2017-10-10 MED ORDER — POTASSIUM CHLORIDE 20 MEQ PO PACK: 20.0000 meq | PACK | Freq: Every day | ORAL | 3 refills | Status: DC

## 2017-10-10 NOTE — Progress Notes (Signed)
Refill sent.

## 2017-10-11 ENCOUNTER — Other Ambulatory Visit: Payer: Self-pay | Admitting: Osteopathic Medicine

## 2017-10-11 DIAGNOSIS — E876 Hypokalemia: Secondary | ICD-10-CM

## 2017-10-11 MED ORDER — POTASSIUM CHLORIDE 20 MEQ PO PACK: 20.0000 meq | PACK | Freq: Two times a day (BID) | ORAL | 3 refills | Status: DC

## 2017-10-11 NOTE — Progress Notes (Signed)
Potassium dose change

## 2017-10-13 ENCOUNTER — Emergency Department (INDEPENDENT_AMBULATORY_CARE_PROVIDER_SITE_OTHER)
Admission: EM | Admit: 2017-10-13 | Discharge: 2017-10-13 | Disposition: A | Discharge status: Home / Self Care | Payer: Medicare Other | Source: Home / Self Care | Attending: Family Medicine | Admitting: Family Medicine

## 2017-10-13 ENCOUNTER — Other Ambulatory Visit: Payer: Self-pay

## 2017-10-13 ENCOUNTER — Emergency Department (INDEPENDENT_AMBULATORY_CARE_PROVIDER_SITE_OTHER): Payer: Medicare Other

## 2017-10-13 ENCOUNTER — Encounter: Payer: Self-pay | Admitting: Emergency Medicine

## 2017-10-13 ENCOUNTER — Telehealth: Payer: Self-pay | Admitting: Emergency Medicine

## 2017-10-13 DIAGNOSIS — J449 Chronic obstructive pulmonary disease, unspecified: Secondary | ICD-10-CM

## 2017-10-13 DIAGNOSIS — R0602 Shortness of breath: Secondary | ICD-10-CM | POA: Diagnosis not present

## 2017-10-13 DIAGNOSIS — J441 Chronic obstructive pulmonary disease with (acute) exacerbation: Secondary | ICD-10-CM

## 2017-10-13 DIAGNOSIS — J42 Unspecified chronic bronchitis: Secondary | ICD-10-CM

## 2017-10-13 DIAGNOSIS — R05 Cough: Secondary | ICD-10-CM | POA: Diagnosis not present

## 2017-10-13 MED ORDER — METHYLPREDNISOLONE SODIUM SUCC 40 MG IJ SOLR
80.0000 mg | Freq: Once | INTRAMUSCULAR | Status: AC
  Administered 2017-10-13: 80 mg via INTRAMUSCULAR

## 2017-10-13 NOTE — ED Triage Notes (Signed)
Patient has noticed increased congestion, cough and shortness of breath over past 2 days; has COPD; no known fever; used nebulizer txs x 2 today; tylenol at 0800; wore nasal 02 over night and this morning, but not on at time of triage.

## 2017-10-13 NOTE — ED Provider Notes (Signed)
[name]    CSN: [id number] Arrival date & time: 10/13/17  1107     History   Chief Complaint Chief Complaint  Patient presents with  . Cough  . Nasal Congestion  . Shortness of Breath    HPI Bill Hinton is a 83 y.o. male.   HPI Bill Hinton is a 83 y.o. male presenting to UC accompanied by his wife and two daughters with concern for worsening congestion, cough, and SOB over the last 2 days.  Hx of COPD.  No known fever. They have done 2 nebulizer treatments PTA and gave Tylenol around 8AM.  He did wear his emergency O2 nasal canula last night and this morning but does not have one on in triage.  Pt has hx of pneumonia. Family concerned he may have pneumonia again.     Past Medical History:  Diagnosis Date  . AAA (abdominal aortic aneurysm) ([place]) 07/23/2017   US done 07/22/17: 2.7 x 3.2 cm  Recommend followup by ultrasound in 3 years  . COPD (chronic obstructive pulmonary disease) ([place])   . Reflux   . Spontaneous pneumothorax     Patient Active Problem List   Diagnosis Date Noted  . Skin cancer 10/04/2017  . Chronic pain of left knee 10/04/2017  . Chronic systolic congestive heart failure ([place]) 10/04/2017  . Elevated serum creatinine 10/04/2017  . AAA (abdominal aortic aneurysm) ([place]) 07/23/2017  . Aorto-iliac atherosclerosis ([place]) 07/19/2017  . Sepsis due to cellulitis ([place]) 05/02/2017  . Mass of upper lobe of right lung 04/09/2017  . Thrombocytopenia ([place]) 04/09/2017  . Benign prostatic hyperplasia 12/14/2016  . CAD (coronary artery disease), native coronary artery 12/14/2016  . Depression with anxiety 12/14/2016  . History of nephrolithiasis 12/14/2016  . Pneumothorax 12/14/2016  . Tinnitus 12/14/2016  . Vitamin D deficiency 12/14/2016  . Rhinorrhea 12/14/2016  . Declining functional status 09/26/2016  . Hospital-acquired pneumonia 09/19/2016  . Restless leg syndrome 09/19/2016  . Acute respiratory failure with hypoxia ([place]) 09/17/2016  .  Bell's palsy 09/17/2016  . Bronchiectasis ([place]) 09/17/2016  . 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 Dysphagia 08/17/2016  . Vasomotor rhinitis 08/17/2016  . Right ureteral calculus 04/13/2015  . Atherosclerotic heart disease of native coronary artery without angina pectoris 04/11/2015  . Chronic kidney disease 04/11/2015  . Diaphragmatic hernia without obstruction or gangrene 04/11/2015  . Enlarged prostate without lower urinary tract symptoms (luts) 04/11/2015  . Essential (primary) hypertension 04/11/2015  . Nocturia 04/11/2015  . Other seasonal allergic rhinitis 04/11/2015  . Panic disorder without agoraphobia 04/11/2015  . Status post cataract extraction 01/21/2014  . Presence of intraocular lens 01/21/2014  . Pseudophakia of both eyes 01/21/2014  . Nuclear sclerosis of right eye 01/19/2014  . 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 Pterygium of left eye 05/29/2012  . Hyperlipidemia 12/30/2008  . Steroid-dependent chronic obstructive pulmonary disease ([place]) 12/30/2008  . Gastroesophageal reflux disease without esophagitis 12/30/2008    Past Surgical History:  Procedure Laterality Date  . CHOLECYSTECTOMY    . HERNIA REPAIR         Home Medications    Prior to Admission medications   Medication Sig Start Date End Date Taking? Authorizing Provider  acetaminophen (TYLENOL) 500 MG tablet Take 1-2 tablets (500-1,000 mg total) every 6 (six) hours as needed by mouth. 08/21/17   Emeterio Reeve, DO  AMBULATORY NON FORMULARY MEDICATION Disp commode chair Dx Debility and ambulatory dysfunction, CHF, COPD 04/25/17  Emeterio Reeve, DO  aspirin 81 MG chewable tablet Chew 1 tablet (81 mg total) by mouth daily. 07/05/17   Emeterio Reeve, DO  Cholecalciferol (VITAMIN D-3) 1000 units CAPS Take 1 capsule by mouth daily.    [provider]  citalopram (CELEXA) 40 MG tablet TAKE 1 TABLET(40 MG)  BY MOUTH DAILY 10/10/17   Emeterio Reeve, DO  clonazePAM [place]) 0.5 MG tablet Take 1/2 tablet 30 minutes before bedtime 07/12/17   Cameron Sprang, MD  donepezil (ARICEPT) 10 MG tablet Take 10 mg by mouth daily.    [provider]  furosemide (LASIX) 20 MG tablet Take 1 tablet (20 mg total) by mouth daily. Can take 2 tablets (40 mg) If increased swelling or weight gain 05/17/17   Emeterio Reeve, DO  ipratropium-albuterol (DUONEB) 0.5-2.5 (3) MG/3ML SOLN Inhale into the lungs. 12/13/15   [provider]  lansoprazole (PREVACID) 30 MG capsule Take 1 capsule (30 mg total) by mouth daily at 12 noon. 07/05/17   Emeterio Reeve, DO  metoprolol tartrate (LOPRESSOR) 25 MG tablet Take 12.5 mg by mouth daily.    [provider]  montelukast (SINGULAIR) 10 MG tablet Take 10 mg by mouth daily. 11/16/16   [provider]  ondansetron (ZOFRAN) 8 MG tablet Take 1 tablet (8 mg total) every 8 (eight) hours as needed by mouth for nausea or vomiting. Patient not taking: Reported on 10/04/2017 08/21/17   Emeterio Reeve, DO  potassium chloride (KLOR-CON) 20 MEQ packet Take 20 mEq by mouth 2 (two) times daily. 10/11/17   Emeterio Reeve, DO  predniSONE (DELTASONE) 5 MG tablet Take 3 tablets (15 mg total) by mouth daily with breakfast. Until in to see lung doctor 05/17/17   Emeterio Reeve, DO  QUEtiapine (SEROQUEL) 50 MG tablet Take 1 tablet (50 mg total) by mouth at bedtime. 07/12/17   Cameron Sprang, MD  rosuvastatin (CRESTOR) 5 MG tablet Take 1 tablet (5 mg total) by mouth at bedtime. 03/08/17   Emeterio Reeve, DO    Family History Family History  Problem Relation Age of Onset  . Heart attack Mother   . Heart attack Father     Social History Social History   Tobacco Use  . Smoking status: Former Smoker    Last attempt to quit: 10/15/1998    Years since quitting: 19.0  . Smokeless tobacco: Never Used  Substance Use Topics  . Alcohol use: No  . Drug  use: No     Allergies   Fluticasone-salmeterol; Cefepime; Hydromorphone hcl; and Theophylline   Review of Systems Review of Systems  Constitutional: Negative for chills and fever.  HENT: Positive for congestion. Negative for ear pain, sore throat, trouble swallowing and voice change.   Respiratory: Positive for cough, chest tightness, shortness of breath and wheezing.   Cardiovascular: Negative for chest pain and palpitations.  Gastrointestinal: Negative for abdominal pain, diarrhea, nausea and vomiting.  Musculoskeletal: Negative for arthralgias, back pain and myalgias.  Skin: Negative for rash.     Physical Exam Triage Vital Signs ED Triage Vitals [10/13/17 1132]  Enc Vitals Group     BP 117/65     Pulse Rate 100     Resp 20     Temp 98.5 F (36.9 C)     Temp Source Oral     SpO2 91 %     Weight      Height      Head Circumference      Peak Flow  Pain Score      Pain Loc      Pain Edu?      Excl. in [place]?    No data found.  Updated Vital Signs BP 117/65 (BP Location: Right Arm)   Pulse 100   Temp 98.5 F (36.9 C) (Oral)   Resp 20   SpO2 (!) 88%   Visual Acuity Right Eye Distance:   Left Eye Distance:   Bilateral Distance:    Right Eye Near:   Left Eye Near:    Bilateral Near:     Physical Exam  Constitutional: He is oriented to person, place, and time. He appears well-developed and well-nourished.  Non-toxic appearance. He does not appear ill. No distress.  HENT:  Head: Normocephalic and atraumatic.  Right Ear: Tympanic membrane normal.  Left Ear: Tympanic membrane normal.  Nose: Mucosal edema and rhinorrhea present. Right sinus exhibits no maxillary sinus tenderness and no frontal sinus tenderness. Left sinus exhibits no maxillary sinus tenderness and no frontal sinus tenderness.  Mouth/Throat: Uvula is midline, oropharynx is clear and moist and mucous membranes are normal.  Eyes: EOM are normal.  Neck: Normal range of motion.  Cardiovascular:  Normal rate and regular rhythm.  Pulmonary/Chest: Effort normal. No accessory muscle usage. No respiratory distress. He has no decreased breath sounds. He has wheezes. He has rhonchi. He has rales.  Musculoskeletal: Normal range of motion.  Neurological: He is alert and oriented to person, place, and time.  Skin: Skin is warm and dry.  Psychiatric: He has a normal mood and affect. His behavior is normal.  Nursing note and vitals reviewed.    UC Treatments / Results  Labs (all labs ordered are listed, but only abnormal results are displayed) Labs Reviewed - No data to display  EKG  EKG Interpretation None       Radiology Dg Chest 2 View  Result Date: 10/13/2017 CLINICAL DATA:  Productive cough with shortness of breath.  COPD. EXAM: CHEST  2 VIEW COMPARISON:  08/21/2017 FINDINGS: Moderate hyperinflation. Midline trachea. Normal heart size. Atherosclerosis in the transverse aorta. Chronic left costophrenic angle blunting on the frontal is likely due to pleural thickening. Biapical pleural-parenchymal scarring is similar. Moderate to marked lower lobe predominant pulmonary interstitial thickening. This is asymmetric, greater on the left. No well-defined lobar consolidation. IMPRESSION: Marked COPD/chronic bronchitis. No convincing evidence of superimposed acute process. Electronically Signed   By: Abigail Miyamoto M.D.   On: 10/13/2017 11:45    Procedures Procedures (including critical care time)  Medications Ordered in UC Medications  methylPREDNISolone sodium succinate (SOLU-MEDROL) 40 mg/mL injection 80 mg (80 mg Intramuscular Given 10/13/17 1159)     Initial Impression / Assessment and Plan / UC Course  I have reviewed the triage vital signs and the nursing notes.  Pertinent labs & imaging results that were available during my care of the patient were reviewed by me and considered in my medical decision making (see chart for details).     O2 Sat on RA- 91%. Reviewed charts  from 08/30/17 and 08/08/17: O2 Sat was 94% on RA Diffuse wheeze and course breath sound son exam but pt able to speak in full sentences.   CXR: exacerbation of COPD w/o evidence of acute process  Solumedrol 80mg  IM given in UC Will hold off on antibiotics at this time. May given Mucinex to help with congestion May continue nebulizer treatments at home. F/u in 1-2 days for recheck of symptoms. Discussed symptoms that warrant emergent care in  the ED.    Final Clinical Impressions(s) / UC Diagnoses   Final diagnoses:  COPD exacerbation [facility])    ED Discharge Orders    None       Controlled Substance Prescriptions [place] Controlled Substance Registry consulted? Not Applicable   Tyrell Antonio 10/13/17 7425

## 2017-10-13 NOTE — Telephone Encounter (Signed)
Spoke with daughter. Pt is resting at home and doing well. They are monitoring his O2 at home. Encouraged to bring pt to hospital if O2 doesn't stay above 90% or if symptoms worsen this evening.  Don't hesitate to call if questions/concerns. Daughter understanding and agreeable with plan.

## 2017-10-14 ENCOUNTER — Telehealth: Payer: Self-pay

## 2017-10-14 NOTE — Telephone Encounter (Signed)
Spoke with daughter, doing much better.  O2 sat has been running 92-94%.  Wanted to thank Leeroy Cha for her caring attitude.

## 2017-10-15 ENCOUNTER — Emergency Department (INDEPENDENT_AMBULATORY_CARE_PROVIDER_SITE_OTHER)
Admission: EM | Admit: 2017-10-15 | Discharge: 2017-10-15 | Disposition: A | Discharge status: Home / Self Care | Payer: Medicare Other | Source: Home / Self Care | Attending: Emergency Medicine | Admitting: Emergency Medicine

## 2017-10-15 ENCOUNTER — Emergency Department (INDEPENDENT_AMBULATORY_CARE_PROVIDER_SITE_OTHER): Payer: Medicare Other

## 2017-10-15 ENCOUNTER — Other Ambulatory Visit: Payer: Self-pay

## 2017-10-15 ENCOUNTER — Encounter: Payer: Self-pay | Admitting: Emergency Medicine

## 2017-10-15 DIAGNOSIS — R05 Cough: Secondary | ICD-10-CM | POA: Diagnosis not present

## 2017-10-15 DIAGNOSIS — R0902 Hypoxemia: Secondary | ICD-10-CM

## 2017-10-15 DIAGNOSIS — R058 Other specified cough: Secondary | ICD-10-CM

## 2017-10-15 DIAGNOSIS — R062 Wheezing: Secondary | ICD-10-CM | POA: Diagnosis not present

## 2017-10-15 DIAGNOSIS — J441 Chronic obstructive pulmonary disease with (acute) exacerbation: Secondary | ICD-10-CM

## 2017-10-15 MED ORDER — METHYLPREDNISOLONE ACETATE 80 MG/ML IJ SUSP
80.0000 mg | Freq: Once | INTRAMUSCULAR | Status: AC
  Administered 2017-10-15: 80 mg via INTRAMUSCULAR

## 2017-10-15 MED ORDER — IPRATROPIUM-ALBUTEROL 0.5-2.5 (3) MG/3ML IN SOLN
3.0000 mL | RESPIRATORY_TRACT | Status: AC
  Administered 2017-10-15: 3 mL via RESPIRATORY_TRACT

## 2017-10-15 MED ORDER — CEFDINIR 300 MG PO CAPS: 300.0000 mg | ORAL_CAPSULE | Freq: Two times a day (BID) | ORAL | 0 refills | Status: DC

## 2017-10-15 MED ORDER — PREDNISONE 10 MG PO TABS: ORAL_TABLET | ORAL | 0 refills | Status: DC

## 2017-10-15 NOTE — ED Provider Notes (Addendum)
[name]    CSN: [id number] Arrival date & time: 10/15/17  1242     History   Chief Complaint Chief Complaint  Patient presents with  . Cough  Chief complaint, productive cough and wheezing and shortness of breath  HPI Bill Hinton is a 84 y.o. male.   HPI Brought in by daughter and wife.Marland Kitchen He lives with her adult son and wife. Was seen here by Junie Panning 12/30 with exacerbation of COPD, chest x-ray showed no infiltrate, treated with Depo-Medrol 80 IM, and he improved somewhat for a day or so, but today feels worse, worsening cough productive of green sputum, wheezing, chest congestion. No syncope or lightheadedness or change of mental status. He was able to eat breakfast this morning without problems. No nausea or vomiting or any current abdominal pain. He states muscles of ribs and chest wall are sore from coughing. Long-standing history COPD, uses home oxygen at night. Home O2 use are normally in the 90-92+ range. Chronically uses DuoNeb nebulizer at home every 6 hours, last used about 6 hours ago. Patient, daughter, and wife are emphatic that they want to be treated here in our urgent care and not go to the emergency room, although I explained significant risks, including possible death, of not being evaluated and treated at [facility] emergency room. Past Medical History:  Diagnosis Date  . AAA (abdominal aortic aneurysm) ([place]) 07/23/2017   US done 07/22/17: 2.7 x 3.2 cm  Recommend followup by ultrasound in 3 years  . COPD (chronic obstructive pulmonary disease) ([place])   . Reflux   . Spontaneous pneumothorax     Patient Active Problem List   Diagnosis Date Noted  . Skin cancer 10/04/2017  . Chronic pain of left knee 10/04/2017  . Chronic systolic congestive heart failure ([place]) 10/04/2017  . Elevated serum creatinine 10/04/2017  . AAA (abdominal aortic aneurysm) ([place]) 07/23/2017  . Aorto-iliac atherosclerosis ([place]) 07/19/2017  . Sepsis due to cellulitis ([place])  05/02/2017  . Mass of upper lobe of right lung 04/09/2017  . Thrombocytopenia ([place]) 04/09/2017  . Benign prostatic hyperplasia 12/14/2016  . CAD (coronary artery disease), native coronary artery 12/14/2016  . Depression with anxiety 12/14/2016  . History of nephrolithiasis 12/14/2016  . Pneumothorax 12/14/2016  . Tinnitus 12/14/2016  . Vitamin D deficiency 12/14/2016  . Rhinorrhea 12/14/2016  . Declining functional status 09/26/2016  . Hospital-acquired pneumonia 09/19/2016  . Restless leg syndrome 09/19/2016  . Acute respiratory failure with hypoxia ([place]) 09/17/2016  . Bell's palsy 09/17/2016  . Bronchiectasis ([place]) 09/17/2016  . Mounier-Kuhn bronchiectasis with acute exacerbation ([place]) 08/17/2016  . Dysphagia 08/17/2016  . Vasomotor rhinitis 08/17/2016  . Right ureteral calculus 04/13/2015  . Atherosclerotic heart disease of native coronary artery without angina pectoris 04/11/2015  . Chronic kidney disease 04/11/2015  . Diaphragmatic hernia without obstruction or gangrene 04/11/2015  . Enlarged prostate without lower urinary tract symptoms (luts) 04/11/2015  . Essential (primary) hypertension 04/11/2015  . Nocturia 04/11/2015  . Other seasonal allergic rhinitis 04/11/2015  . Panic disorder without agoraphobia 04/11/2015  . Status post cataract extraction 01/21/2014  . Presence of intraocular lens 01/21/2014  . Pseudophakia of both eyes 01/21/2014  . Nuclear sclerosis of right eye 01/19/2014  . 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 Pterygium of left eye 05/29/2012  . Hyperlipidemia 12/30/2008  . Steroid-dependent chronic obstructive pulmonary disease ([place]) 12/30/2008  . Gastroesophageal reflux disease without esophagitis 12/30/2008    Past Surgical History:  Procedure Laterality  Date  . CHOLECYSTECTOMY    . HERNIA REPAIR         Home Medications    Prior to Admission medications   Medication Sig Start Date End Date  Taking? Authorizing Provider  acetaminophen (TYLENOL) 500 MG tablet Take 1-2 tablets (500-1,000 mg total) every 6 (six) hours as needed by mouth. 08/21/17   Emeterio Reeve, DO  AMBULATORY NON FORMULARY MEDICATION Disp commode chair Dx Debility and ambulatory dysfunction, CHF, COPD 04/25/17   Emeterio Reeve, DO  aspirin 81 MG chewable tablet Chew 1 tablet (81 mg total) by mouth daily. 07/05/17   Emeterio Reeve, DO  cefdinir (OMNICEF) 300 MG capsule Take 1 capsule (300 mg total) by mouth 2 (two) times daily. X 10 days 10/15/17   Jacqulyn Cane, MD  Cholecalciferol (VITAMIN D-3) 1000 units CAPS Take 1 capsule by mouth daily.    [provider]  citalopram (CELEXA) 40 MG tablet TAKE 1 TABLET(40 MG) BY MOUTH DAILY 10/10/17   Emeterio Reeve, DO  clonazePAM [place]bye [place]) 0.5 MG tablet Take 1/2 tablet 30 minutes before bedtime 07/12/17   Cameron Sprang, MD  donepezil (ARICEPT) 10 MG tablet Take 10 mg by mouth daily.    [provider]  furosemide (LASIX) 20 MG tablet Take 1 tablet (20 mg total) by mouth daily. Can take 2 tablets (40 mg) If increased swelling or weight gain 05/17/17   Emeterio Reeve, DO  ipratropium-albuterol (DUONEB) 0.5-2.5 (3) MG/3ML SOLN Inhale into the lungs. 12/13/15   [provider]  lansoprazole (PREVACID) 30 MG capsule Take 1 capsule (30 mg total) by mouth daily at 12 noon. 07/05/17   Emeterio Reeve, DO  metoprolol tartrate (LOPRESSOR) 25 MG tablet Take 12.5 mg by mouth daily.    [provider]  montelukast (SINGULAIR) 10 MG tablet Take 10 mg by mouth daily. 11/16/16   [provider]  potassium chloride (KLOR-CON) 20 MEQ packet Take 20 mEq by mouth 2 (two) times daily. 10/11/17   Emeterio Reeve, DO  predniSONE (DELTASONE) 10 MG tablet Take 3 by mouth (30 mg) with breakfast daily for 1 week until you see your lung dr. 10/15/17   Jacqulyn Cane, MD  QUEtiapine (SEROQUEL) 50 MG tablet Take 1 tablet (50 mg total) by mouth at  bedtime. 07/12/17   Cameron Sprang, MD  rosuvastatin (CRESTOR) 5 MG tablet Take 1 tablet (5 mg total) by mouth at bedtime. 03/08/17   Emeterio Reeve, DO    Family History Family History  Problem Relation Age of Onset  . Heart attack Mother   . Heart attack Father     Social History Social History   Tobacco Use  . Smoking status: Former Smoker    Last attempt to quit: 10/15/1998    Years since quitting: 19.0  . Smokeless tobacco: Never Used  Substance Use Topics  . Alcohol use: No  . Drug use: No     Allergies   Fluticasone-salmeterol; Cefepime; Hydromorphone hcl; and Theophylline   Review of Systems Review of Systems  Constitutional: Positive for fatigue. Negative for fever.  HENT: Negative for sore throat.   Respiratory: Positive for shortness of breath and wheezing.        Chest congestion  Cardiovascular: Negative for chest pain and palpitations.  Gastrointestinal: Positive for abdominal pain (currently). Negative for nausea and vomiting.  Skin: Negative for rash and wound.  Neurological: Negative for seizures and speech difficulty.  Psychiatric/Behavioral: Negative for confusion and hallucinations.   no other change in his chronic  review of systems   Physical Exam Triage Vital Signs ED Triage Vitals  Enc Vitals Group     BP      Pulse      Resp      Temp      Temp src      SpO2      Weight      Height      Head Circumference      Peak Flow      Pain Score      Pain Loc      Pain Edu?      Excl. in [place]?    No data found.  Updated Vital Signs BP 112/71   Pulse 96   Temp 98.9 F (37.2 C) (Oral)   Resp 20   SpO2 90%   Visual Acuity Right Eye Distance:   Left Eye Distance:   Bilateral Distance:    Right Eye Near:   Left Eye Near:    Bilateral Near:     Physical Exam  Constitutional: He is oriented to person, place, and time. He appears well-developed and well-nourished. No distress.  Mildly dyspneic, but he is able to talk while  breathing. No definite cardiorespiratory distress. O2 saturation room air 88, we'll repeat after DuoNeb treatment Alert, cooperative.   HENT:  Head: Normocephalic and atraumatic.  Right Ear: Tympanic membrane normal.  Left Ear: Tympanic membrane normal.  Nose: Nose normal.  Mouth/Throat: Oropharynx is clear and moist. No oropharyngeal exudate.  Eyes: Right eye exhibits no discharge. Left eye exhibits no discharge. No scleral icterus.  Neck: Neck supple. No JVD present. No tracheal deviation present.  Cardiovascular: Normal rate, regular rhythm and normal heart sounds.  No murmur heard. Pulmonary/Chest: No respiratory distress. He has wheezes. He has rhonchi. He has no rales.  Abdominal: There is no tenderness.  Musculoskeletal: He exhibits no edema.  Lymphadenopathy:    He has no cervical adenopathy.  Neurological: He is alert and oriented to person, place, and time.  Skin: Skin is warm and dry. Capillary refill takes 2 to 3 seconds. No rash noted.  Nursing note and vitals reviewed.    UC Treatments / Results  Labs (all labs ordered are listed, but only abnormal results are displayed) Labs Reviewed - No data to display  EKG  EKG Interpretation None       Radiology Dg Chest 2 View  Result Date: 10/15/2017 CLINICAL DATA:  Worsening cough for 2 days EXAM: CHEST  2 VIEW COMPARISON:  10/13/2017 FINDINGS: Fibrotic changes throughout both lungs are stable. Chronic pleural changes at the left base. No pneumothorax. Normal heart size. Hyperaeration. Osteopenia. Stable thoracic spine. IMPRESSION: Chronic changes. Electronically Signed   By: Marybelle Killings M.D.   On: 10/15/2017 13:44    Procedures Procedures (including critical care time)  Medications Ordered in UC Medications  ipratropium-albuterol (DUONEB) 0.5-2.5 (3) MG/3ML nebulizer solution 3 mL (3 mLs Nebulization Given 10/15/17 1334)  methylPREDNISolone acetate (DEPO-MEDROL) injection 80 mg (80 mg Intramuscular Given 10/15/17  1412)     Initial Impression / Assessment and Plan / UC Course  I have reviewed the triage vital signs and the nursing notes.  Pertinent labs & imaging results that were available during my care of the patient were reviewed by me and considered in my medical decision making (see chart for details).  Clinical Course as of Oct 15 1422  Tue Oct 15, 2017  1330 History obtained, patient evaluated at bedside. Family refused to take him  to emergency room now. Chest x-ray and DuoNeb nebulizer treatment ordered   [DM]  8841 After nebulizer with DuoNeb, O2 sat improved 90% room air, BP 112/71  [DM]  1354 Chest x-ray reviewed, chronic changes of COPD without any acute abnormality or infiltrate  [DM]    Clinical Course User Index [DM] Jacqulyn Cane, MD    Breath sounds improved after DuoNeb treatment. Although still had diffuse rhonchi and mild late expiratory wheezing.  Final Clinical Impressions(s) / UC Diagnoses   Final diagnoses:  Wheezing  Cough productive of purulent sputum  COPD with acute exacerbation [facility])    ED Discharge Orders        Ordered    predniSONE (DELTASONE) 10 MG tablet     10/15/17 1421    cefdinir (OMNICEF) 300 MG capsule  2 times daily     10/15/17 1421     Note that he's taken Omnicef and cephalexin in the past without reaction or side effects. I found in medical records that once in Hospital he had myoclonus, possibly associated with cefepime IV. (But not allergic reaction) I discussed risks of Omnicef selection, but in my opinion other antibiotic choices may carry higher risk and patient and family agree with [place]. After risks benefits alternatives discussed, patient, adult daughter, and wife refused the option of going to hospital emergency room now, even after I explained risks of not going to hospital now, including worsening COPD, hypoxia, and even death.  An After Visit Summary was printed and given to the patient, adult daughter, and wife: I carefully  reviewed with them. Questions invited and answered.  Today, chest x-ray shows stable COPD, no sign of pneumonia. After DuoNeb treatment, oxygen saturation improved from 88-90 on room air. You Likely have flareup of COPD, and may have bronchitis/bacterial infection. Today, we gave you a cortisone shot of Depo-Medrol 80 mg. Medication change: Increase your usual dose of prednisone from 15 mg daily to the prescription of 30 mg daily for the next week, but at your follow-up with your doctor at [facility] Chest, they can adjust prednisone dosing. Today, New prescription for oral antibiotic.-Omnicef (similar to Keflex) Continue home oxygen up to 2 L at night, and may even use during the day if needed for shortness of breath. Follow-up with your doctor at [facility] chest within 3 days, go to ER sooner if any severe or worsening symptoms   Controlled Substance Prescriptions [place] Controlled Substance Registry consulted? Not Applicable   Jacqulyn Cane, MD 10/15/17 1433   Over 50 minutes spent, greater than 50% of the time spent for counseling and coordination of care.    Jacqulyn Cane, MD 10/15/17 1436

## 2017-10-15 NOTE — ED Triage Notes (Signed)
C/O Shortness of Breath, Cough with Green Mucus

## 2017-10-15 NOTE — Discharge Instructions (Signed)
Today, chest x-ray shows stable COPD, no sign of pneumonia. After DuoNeb treatment, oxygen saturation improved from 88-90 on room air.  You Likely have flareup of COPD, and may have bronchitis/bacterial infection. Today, we gave you a cortisone shot of Depo-Medrol 80 mg. Medication change: Increase your usual dose of prednisone from 15 mg daily to the prescription of 30 mg daily for the next week, but at your follow-up with your doctor at [facility], they can adjust prednisone dosing. Today, New prescription for oral antibiotic.-Omnicef (similar to Keflex) Continue home oxygen up to 2 L at night, and may even use during the day if needed for shortness of breath. Follow-up with your doctor at [facility] chest within 3 days, go to ER sooner if any severe or worsening symptoms

## 2017-10-18 DIAGNOSIS — R0902 Hypoxemia: Secondary | ICD-10-CM | POA: Diagnosis not present

## 2017-10-18 DIAGNOSIS — J449 Chronic obstructive pulmonary disease, unspecified: Secondary | ICD-10-CM | POA: Diagnosis not present

## 2017-10-18 DIAGNOSIS — E876 Hypokalemia: Secondary | ICD-10-CM | POA: Diagnosis not present

## 2017-10-18 DIAGNOSIS — R918 Other nonspecific abnormal finding of lung field: Secondary | ICD-10-CM | POA: Diagnosis not present

## 2017-10-18 DIAGNOSIS — J479 Bronchiectasis, uncomplicated: Secondary | ICD-10-CM | POA: Diagnosis not present

## 2017-10-18 DIAGNOSIS — R845 Abnormal microbiological findings in specimens from respiratory organs and thorax: Secondary | ICD-10-CM | POA: Diagnosis not present

## 2017-10-18 LAB — BASIC METABOLIC PANEL WITH GFR
BUN: 25 mg/dL (ref 7–25)
CO2: 29 mmol/L (ref 20–32)
CREATININE: 1.04 mg/dL (ref 0.70–1.11)
Calcium: 9.7 mg/dL (ref 8.6–10.3)
Chloride: 104 mmol/L (ref 98–110)
GFR, EST NON AFRICAN AMERICAN: 66 mL/min/{1.73_m2} (ref >=60)
GFR, Est African American: 77 mL/min/{1.73_m2} (ref >=60)
GLUCOSE: 98 mg/dL (ref 65–99)
Potassium: 4.5 mmol/L (ref 3.5–5.3)
SODIUM: 140 mmol/L (ref 135–146)

## 2017-10-22 DIAGNOSIS — R9389 Abnormal findings on diagnostic imaging of other specified body structures: Secondary | ICD-10-CM | POA: Diagnosis not present

## 2017-10-22 DIAGNOSIS — R918 Other nonspecific abnormal finding of lung field: Secondary | ICD-10-CM | POA: Diagnosis not present

## 2017-10-24 DIAGNOSIS — Z85828 Personal history of other malignant neoplasm of skin: Secondary | ICD-10-CM | POA: Diagnosis not present

## 2017-10-24 DIAGNOSIS — L570 Actinic keratosis: Secondary | ICD-10-CM | POA: Diagnosis not present

## 2017-10-25 ENCOUNTER — Telehealth: Payer: Self-pay | Admitting: Neurology

## 2017-10-25 ENCOUNTER — Other Ambulatory Visit: Payer: Self-pay

## 2017-10-25 DIAGNOSIS — R9389 Abnormal findings on diagnostic imaging of other specified body structures: Secondary | ICD-10-CM | POA: Diagnosis not present

## 2017-10-25 DIAGNOSIS — J479 Bronchiectasis, uncomplicated: Secondary | ICD-10-CM | POA: Diagnosis not present

## 2017-10-25 DIAGNOSIS — R918 Other nonspecific abnormal finding of lung field: Secondary | ICD-10-CM | POA: Diagnosis not present

## 2017-10-25 DIAGNOSIS — I10 Essential (primary) hypertension: Secondary | ICD-10-CM | POA: Diagnosis not present

## 2017-10-25 DIAGNOSIS — J449 Chronic obstructive pulmonary disease, unspecified: Secondary | ICD-10-CM | POA: Diagnosis not present

## 2017-10-25 DIAGNOSIS — R0902 Hypoxemia: Secondary | ICD-10-CM | POA: Diagnosis not present

## 2017-10-25 MED ORDER — DONEPEZIL HCL 10 MG PO TABS: 10.0000 mg | ORAL_TABLET | Freq: Every day | ORAL | 3 refills | Status: DC

## 2017-10-25 NOTE — Telephone Encounter (Signed)
Katharine Look called and wants to know if Dr Delice Lesch can call in a prescription of Donepezil 10mg 

## 2017-10-25 NOTE — Telephone Encounter (Signed)
Rx sent 

## 2017-10-25 NOTE — Telephone Encounter (Signed)
patient daughter called and states that her father is out of the  Donepezil and if we could call in a RX at [facility] on [address] in [place]

## 2017-11-06 DIAGNOSIS — I10 Essential (primary) hypertension: Secondary | ICD-10-CM | POA: Diagnosis not present

## 2017-11-06 DIAGNOSIS — R845 Abnormal microbiological findings in specimens from respiratory organs and thorax: Secondary | ICD-10-CM | POA: Diagnosis not present

## 2017-11-06 DIAGNOSIS — J449 Chronic obstructive pulmonary disease, unspecified: Secondary | ICD-10-CM | POA: Diagnosis not present

## 2017-11-06 DIAGNOSIS — Z7952 Long term (current) use of systemic steroids: Secondary | ICD-10-CM | POA: Diagnosis not present

## 2017-11-06 DIAGNOSIS — R1312 Dysphagia, oropharyngeal phase: Secondary | ICD-10-CM | POA: Diagnosis not present

## 2017-11-06 DIAGNOSIS — B449 Aspergillosis, unspecified: Secondary | ICD-10-CM | POA: Diagnosis not present

## 2017-11-06 DIAGNOSIS — J479 Bronchiectasis, uncomplicated: Secondary | ICD-10-CM | POA: Diagnosis not present

## 2017-11-14 DIAGNOSIS — B353 Tinea pedis: Secondary | ICD-10-CM | POA: Diagnosis not present

## 2017-11-14 DIAGNOSIS — L820 Inflamed seborrheic keratosis: Secondary | ICD-10-CM | POA: Diagnosis not present

## 2017-11-27 DIAGNOSIS — H353134 Nonexudative age-related macular degeneration, bilateral, advanced atrophic with subfoveal involvement: Secondary | ICD-10-CM | POA: Diagnosis not present

## 2017-11-29 DIAGNOSIS — J479 Bronchiectasis, uncomplicated: Secondary | ICD-10-CM | POA: Diagnosis not present

## 2017-11-29 DIAGNOSIS — J449 Chronic obstructive pulmonary disease, unspecified: Secondary | ICD-10-CM | POA: Diagnosis not present

## 2017-11-29 DIAGNOSIS — R0902 Hypoxemia: Secondary | ICD-10-CM | POA: Diagnosis not present

## 2017-11-29 DIAGNOSIS — I10 Essential (primary) hypertension: Secondary | ICD-10-CM | POA: Diagnosis not present

## 2017-11-29 DIAGNOSIS — R0600 Dyspnea, unspecified: Secondary | ICD-10-CM | POA: Diagnosis not present

## 2017-12-05 DIAGNOSIS — I10 Essential (primary) hypertension: Secondary | ICD-10-CM | POA: Diagnosis not present

## 2017-12-05 DIAGNOSIS — I5022 Chronic systolic (congestive) heart failure: Secondary | ICD-10-CM | POA: Diagnosis not present

## 2017-12-06 ENCOUNTER — Encounter: Payer: Self-pay | Admitting: Neurology

## 2017-12-06 ENCOUNTER — Ambulatory Visit (INDEPENDENT_AMBULATORY_CARE_PROVIDER_SITE_OTHER): Payer: Medicare Other | Admitting: Neurology

## 2017-12-06 VITALS — BP 132/70 | HR 98 | Ht 71.0 in | Wt 141.0 lb

## 2017-12-06 DIAGNOSIS — G2581 Restless legs syndrome: Secondary | ICD-10-CM | POA: Diagnosis not present

## 2017-12-06 DIAGNOSIS — F0391 Unspecified dementia with behavioral disturbance: Secondary | ICD-10-CM

## 2017-12-06 DIAGNOSIS — R442 Other hallucinations: Secondary | ICD-10-CM | POA: Diagnosis not present

## 2017-12-06 DIAGNOSIS — F03B18 Unspecified dementia, moderate, with other behavioral disturbance: Secondary | ICD-10-CM

## 2017-12-06 NOTE — Progress Notes (Signed)
Follow-up Visit   Date: 12/06/17    Keldan Eplin MRN: [id number] DOB: [birth year]   Interim History: Bill Hinton is a 84 y.o. right-handed Caucasian male with COPD, right Bell's palsy, and CHF returning to the clinic for follow-up of dementia.  The patient was accompanied to the clinic by wife and daughter who also provides collateral information.    Since 2016, family had started noticing that patient would forget things very easily, such as locations, names, and medications.  During the summer of 2018, he started needing more help with basic ADLs such as showering and dressing himself.   He was seeing a neurologist Dr. Wynelle Link who started him on donepezil, citalopram, and seroquel 100mg  at bedtime. He was seeing visual hallucinations and some agitation.   He came for a 2nd opinion with Dr. Delice Lesch in September 2018 for dementia and RLS.  At this visit, there was concern of polypharmacy due to severe daytime sedation so his seroquel was reduced to 50mg  at bedtime which has made a huge improvement in his cogntive as well as physical health.  He is able to engage in activities much better and start a home physical therapy program with his son.  Family is very pleased with him being more alert and that his gait, assisted with walker, is better.  He is now taking clonazepam 0.125mg  at bedtime (1/4 tab) and off Requip which controls his RLS.  Their biggest concern today is ongoing issues with tactile hallucinations, feeling as if there are worms rolling on his body.  Because of his discomfort, he sometimes scratches the skin, especially near the ear.  He denies any visual or auditory hallucinations.  Mood and sleep is doing well.  There is no other behavior issues.     Medications:  Current Outpatient Medications on File Prior to Visit  Medication Sig Dispense Refill  . acetaminophen (TYLENOL) 500 MG tablet Take 1-2 tablets (500-1,000 mg total) every 6 (six) hours as needed by mouth. 90 tablet 1    . aspirin 81 MG chewable tablet Chew 1 tablet (81 mg total) by mouth daily. 90 tablet 3  . Cholecalciferol (VITAMIN D-3) 1000 units CAPS Take 1 capsule by mouth daily.    . citalopram (CELEXA) 40 MG tablet TAKE 1 TABLET(40 MG) BY MOUTH DAILY 90 tablet 0  . clonazePAM (KLONOPIN) 0.5 MG tablet Take 1/2 tablet 30 minutes before bedtime 15 tablet 5  . donepezil (ARICEPT) 10 MG tablet Take 1 tablet (10 mg total) by mouth daily. 90 tablet 3  . furosemide (LASIX) 20 MG tablet Take 1 tablet (20 mg total) by mouth daily. Can take 2 tablets (40 mg) If increased swelling or weight gain 90 tablet 1  . ipratropium-albuterol (DUONEB) 0.5-2.5 (3) MG/3ML SOLN Inhale into the lungs.    . lansoprazole (PREVACID) 30 MG capsule Take 1 capsule (30 mg total) by mouth daily at 12 noon. 90 capsule 3  . montelukast (SINGULAIR) 10 MG tablet Take 10 mg by mouth daily.  11  . potassium chloride (KLOR-CON) 20 MEQ packet Take 20 mEq by mouth 2 (two) times daily. 100 packet 3  . predniSONE (DELTASONE) 10 MG tablet Take 3 by mouth (30 mg) with breakfast daily for 1 week until you see your lung dr. 21 tablet 0  . QUEtiapine (SEROQUEL) 50 MG tablet Take 1 tablet (50 mg total) by mouth at bedtime. 30 tablet 6  . rosuvastatin (CRESTOR) 5 MG tablet Take 1 tablet (5 mg total) by mouth at  bedtime. 90 tablet 3   No current facility-administered medications on file prior to visit.     Allergies:  Allergies  Allergen Reactions  . Fluticasone-Salmeterol Anaphylaxis and Other (See Comments)    Other reaction(s): Other (See Comments) Blisters in mouth Blisters in mouth Uses DuoNeb at home without reactions   . Cefepime Other (See Comments)    Myoclonus  . Hydromorphone Hcl   . Theophylline     Other reaction(s): Other (See Comments) Throat irritation    Review of Systems:  CONSTITUTIONAL: No fevers, chills, night sweats, or weight loss.  EYES: No visual changes or eye pain ENT: No hearing changes.  No history of nose  bleeds.   RESPIRATORY: No cough, wheezing and shortness of breath.   CARDIOVASCULAR: Negative for chest pain, and palpitations.   GI: Negative for abdominal discomfort, blood in stools or black stools.  No recent change in bowel habits.   GU:  No history of incontinence.   MUSCLOSKELETAL: No history of joint pain or swelling.  No myalgias.   SKIN: Negative for lesions, rash, and itching.   ENDOCRINE: Negative for cold or heat intolerance, polydipsia or goiter.   PSYCH:  No depression or anxiety symptoms.   NEURO: As Above.   Vital Signs:  BP 132/70   Pulse 98   Ht 5\' 11"  (1.803 m)   Wt 141 lb (64 kg)   SpO2 90%   BMI 19.67 kg/m    General: Elderly appearing, sitting comfortably.  Neurological Exam: MENTAL STATUS including orientation to time, place, person, recent and remote memory, attention span and concentration, language, and fund of knowledge is fair.  He correctly identifies the year, month, and day of the week.  He cannot remember serial presidents.  He is awake and engages in conversation easily. Speech is not dysarthric.  CRANIAL NERVES:  Pupils equal round and reactive to light.  Normal conjugate, extra-ocular eye movements in all directions of gaze. There is trace R facial asymmetry with smile.  Mild right ptosis.  Palate elevates symmetrically.  Tongue is midline.  MOTOR:  Motor strength is 5/5 in all extremities, except 4/5 bilateral dorsiflexion worse on the left.   Tone is normal.    MSRs:  Reflexes are 1+/4 throughout, except absent at the Achilles.  SENSORY:  Reduced vibration at the ankles bilaterally.  COORDINATION/GAIT:  Normal finger-to- nose-finger.  Intact rapid alternating movements bilaterally.  Gait is tested unassisted and is wide-based with slight dragging of the feet, worse on the left.  He is much more stable using his rollator.   Data: n/a   IMPRESSION/PLAN: 1.  Dementia with behavior disturbance, likely Alzheimer's.  Clinically, he is much more  alert and engaged since reducing his medications and family his very appreciative of Dr. Delice Lesch for making these changes.  Today, he complains of bothersome tactile hallucinations which is most likely associated with his dementia.    - Continue donepezil 10mg  daily.  Caution with further titration given his tactile hallucinations  - Adjust quetiapine to 25mg  twice daily.  Discussed that if patient has increased day-time sedation, may need to reduce to 12.5mg  in the morning for his hallucinations  2.  Restless leg syndrome, well-controlled on clonazepam 0.125mg  at bedtime which he is tolerating well at a low dose.  3.  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  No history of diabetes or alcohol, suggesting this is most likely idiopathic.   I have encouraged him to start doing lower leg  strengthening exercises.    Return to clinic in 6 months with Dr. Delice Lesch.   The duration of this appointment visit was 30 minutes of face-to-face time with the patient.  Greater than 50% of this time was spent in counseling, explanation of diagnosis, planning of further management, and coordination of care.   Thank you for allowing me to participate in patient's care.  If I can answer any additional questions, I would be pleased to do so.    Sincerely,    Donika K. Posey Pronto, DO

## 2017-12-06 NOTE — Patient Instructions (Signed)
Adjust quetiapine to 25mg  (half-tablet) morning and bedtime.    Keep up with your exercises.   Return to clinic in 6 month to see Dr. Delice Lesch

## 2017-12-06 NOTE — Progress Notes (Deleted)
NEUROLOGY FOLLOW UP OFFICE NOTE  Bill Hinton [id number] Apr 11, 1932  HISTORY OF PRESENT ILLNESS: I had the pleasure of seeing *** in follow-up in the neurology clinic on ***.  The patient was last seen on *** and is accompanied by *** today.  Records and images were personally reviewed where available.  ***.  This is a pleasant 84 year old ambidextrous right-hand dominant man with a history of COPD, CHF, and dementia, presenting to establish care. He reports his memory is not too good. He forgets things he used to know such as locations, distances. He stopped driving 8 months ago due to vision problems, denied getting lost driving. His wife took over bill payments a year ago due to his vision issues. She administers his medications. He states he can see an image but cannot see well. His wife and daughter report that memory has been progressively worsening over the past 2 years. They report his long-term memory is "excellent," but short-term memory is a problem. He forgets more, needing help in the shower for the past 2-3 months. He can dress himself but his wife usually assists him. He can follow instructions per wife. He has not been sleeping well, usually waking up after 3-4 hours and putting his clothes on to sleep in the recliner. Family denies any wandering. He had been seeing neurologist Dr. Wynelle Link, per wife he had an MRI brain but they are not aware of the results. He has been taking Donepezil, Citalopram, and Seroquel 100mg  qhs. His wife reports that he has been having visual hallucinations, sometimes reporting seeing a dog or a child by his recliner. Over the past 2 weeks, he thinks there is a worm under his skin and has tried to pull it out from his hand. He was on a higher dose of Seroquel which cause daytime drowsiness. On the 100mg  dose, he still wakes up at night after 3-4 hours of sleep. He is drowsy in the office today, family reports this is his usual nap time. Family reports right  facial weakness 3 years ago, he was brought to [facility] where he was diagnosed with Bell's palsy, however they report that since then, everything went downhill. He was apparently in good shape until the Bell's palsy and has had vision changes and speech changes afterwards. He had speech therapy recently for dysphagia, which has helped a lot. Family denies any paranoia, he is more irritable and snaps more easily. He has spells where he hollers for his mother when he is asleep, this does not occur in the daytime.   He has a history of restless leg syndrome, his wife reports that Requip 1mg  qhs has helped a lot. He states that his legs still jump, as well as his arms. He is noted to have myoclonic jerks in the office today in either leg/arm, and trunk. Body jerks have been going on for at least 2 years. He has occasional dizziness. He has a lot of back pain and aching on his left leg. He reports a history of left ankle injury that has affected his left leg. He has mild neck pain. He denies any headaches, focal numbness/tingling, bowel/bladder dysfunction. He has noticed a decrease in sense of smell. He has occasional hand tremors. There is no family history of dementia. He denies any history of significant head injury, no alcohol use.   PAST MEDICAL HISTORY: Past Medical History:  Diagnosis Date  . AAA (abdominal aortic aneurysm) ([place]) 07/23/2017   US done 07/22/17: 2.7  x 3.2 cm  Recommend followup by ultrasound in 3 years  . COPD (chronic obstructive pulmonary disease) ([place])   . Reflux   . Spontaneous pneumothorax     MEDICATIONS: Current Outpatient Medications on File Prior to Visit  Medication Sig Dispense Refill  . acetaminophen (TYLENOL) 500 MG tablet Take 1-2 tablets (500-1,000 mg total) every 6 (six) hours as needed by mouth. 90 tablet 1  . aspirin 81 MG chewable tablet Chew 1 tablet (81 mg total) by mouth daily. 90 tablet 3  . Cholecalciferol (VITAMIN D-3) 1000 units CAPS Take 1  capsule by mouth daily.    . citalopram (CELEXA) 40 MG tablet TAKE 1 TABLET(40 MG) BY MOUTH DAILY 90 tablet 0  . clonazePAM (KLONOPIN) 0.5 MG tablet Take 1/2 tablet 30 minutes before bedtime 15 tablet 5  . donepezil (ARICEPT) 10 MG tablet Take 1 tablet (10 mg total) by mouth daily. 90 tablet 3  . furosemide (LASIX) 20 MG tablet Take 1 tablet (20 mg total) by mouth daily. Can take 2 tablets (40 mg) If increased swelling or weight gain 90 tablet 1  . ipratropium-albuterol (DUONEB) 0.5-2.5 (3) MG/3ML SOLN Inhale into the lungs.    . lansoprazole (PREVACID) 30 MG capsule Take 1 capsule (30 mg total) by mouth daily at 12 noon. 90 capsule 3  . montelukast (SINGULAIR) 10 MG tablet Take 10 mg by mouth daily.  11  . potassium chloride (KLOR-CON) 20 MEQ packet Take 20 mEq by mouth 2 (two) times daily. 100 packet 3  . predniSONE (DELTASONE) 10 MG tablet Take 3 by mouth (30 mg) with breakfast daily for 1 week until you see your lung dr. 21 tablet 0  . QUEtiapine (SEROQUEL) 50 MG tablet Take 1 tablet (50 mg total) by mouth at bedtime. 30 tablet 6  . rosuvastatin (CRESTOR) 5 MG tablet Take 1 tablet (5 mg total) by mouth at bedtime. 90 tablet 3   No current facility-administered medications on file prior to visit.     ALLERGIES: Allergies  Allergen Reactions  . Fluticasone-Salmeterol Anaphylaxis and Other (See Comments)    Other reaction(s): Other (See Comments) Blisters in mouth Blisters in mouth Uses DuoNeb at home without reactions   . Cefepime Other (See Comments)    Myoclonus  . Hydromorphone Hcl   . Theophylline     Other reaction(s): Other (See Comments) Throat irritation    FAMILY HISTORY: Family History  Problem Relation Age of Onset  . Heart attack Mother   . Heart attack Father     SOCIAL HISTORY: Social History   Socioeconomic History  . Marital status: Married    Spouse name: Not on file  . Number of children: Not on file  . Years of education: Not on file  . Highest  education level: Not on file  Social Needs  . Financial resource strain: Not on file  . Food insecurity - worry: Not on file  . Food insecurity - inability: Not on file  . Transportation needs - medical: Not on file  . Transportation needs - non-medical: Not on file  Occupational History  . Not on file  Tobacco Use  . Smoking status: Former Smoker    Last attempt to quit: 10/15/1998    Years since quitting: 19.1  . Smokeless tobacco: Never Used  Substance and Sexual Activity  . Alcohol use: No  . Drug use: No  . Sexual activity: Not on file  Other Topics Concern  . Not on file  Social History  Narrative   Lives in 1 story home with wife and 1 son   Has 6 adult children   Highest level of education: 7th grade   Retired [employer] ([employer])    REVIEW OF SYSTEMS: Constitutional: No fevers, chills, or sweats, no generalized fatigue, change in appetite Eyes: No visual changes, double vision, eye pain Ear, nose and throat: No hearing loss, ear pain, nasal congestion, sore throat Cardiovascular: No chest pain, palpitations Respiratory:  No shortness of breath at rest or with exertion, wheezes GastrointestinaI: No nausea, vomiting, diarrhea, abdominal pain, fecal incontinence Genitourinary:  No dysuria, urinary retention or frequency Musculoskeletal:  No neck pain, back pain Integumentary: No rash, pruritus, skin lesions Neurological: as above Psychiatric: No depression, insomnia, anxiety Endocrine: No palpitations, fatigue, diaphoresis, mood swings, change in appetite, change in weight, increased thirst Hematologic/Lymphatic:  No anemia, purpura, petechiae. Allergic/Immunologic: no itchy/runny eyes, nasal congestion, recent allergic reactions, rashes  PHYSICAL EXAM: Vitals:   12/06/17 1302  BP: 132/70  Pulse: 98  SpO2: 90%   General: No acute distress Head:  Normocephalic/atraumatic Neck: supple, no paraspinal tenderness, full range of motion Heart:  Regular rate  and rhythm Lungs:  Clear to auscultation bilaterally Back: No paraspinal tenderness Skin/Extremities: No rash, no edema Cranial nerves: CN I: not tested CN II: pupils equal, round and reactive to light, visual fields intact, fundi unremarkable. CN III, IV, VI:  full range of motion, no nystagmus, no ptosis CN V: facial sensation intact CN VII: symmetric smile, Bell's phenomenon with right eye on eye closure (orbicularis oculi weakness) CN VIII: hearing intact to finger rub CN IX, X: gag intact, uvula midline CN XI: sternocleidomastoid and trapezius muscles intact CN XII: tongue midline Bulk & Tone: normal, no cogwheeling, no fasciculations. Motor: 5/5 throughout except for 4/5 left hip flexion, no pronator drift. Sensation: intact to light touch, cold, pin, vibration and joint position sense.  No extinction to double simultaneous stimulation.  Romberg test negative Deep Tendon Reflexes: +1 throughout, no ankle clonus Plantar responses: downgoing bilaterally Cerebellar: no incoordination on finger to nose, heel to shin. No dysdiadochokinesia Gait: slow and cautious with walker, no ataxia Tremor: no resting tremor, mild postural tremor, no action tremor  IMPRESSION: This is an 84 year old ambidextrous right-hand dominant man with a history of COPD, CHF, dementia, RLS, right Bell's palsy, presenting to establish care. His neurological exam shows mild left proximal leg weakness, family reports he is scheduled to see Sports Medicine soon. His MMSE today is 20/30, symptoms suggestive of mild to moderate dementia with behavioral changes (hallucinations). His family is concerned about his medications. Continue Donepezil 10mg  daily and Citalopram 10mg  daily. We discussed the Seroquel for behavioral changes, family describes formication where he thinks worms are crawling under his skin. Would continue Seroquel but lower dose to 50mg  qhs. He is drowsy in the office today. We discussed how the Requip  can also potentially cause more confusion, we will start tapering this off and use a different medication for the RLS. Although we would usually avoid benzodiazepines in this age group, a very low dose clonazepam 0.25mg  73mins before bedtime may help with RLS and sleep maintenance issues. It may help with the myoclonic jerks as well.  Records from his prior neurologist will be requested for review. We discussed 24/7 care, and need for more help at home (caregiver fatigue). It appears his children are helping out a lot as well. Resources for [employer] through the Alzheimer's association was given today,  they are interested in day programs. He may eventually need higher level of care such as assisted living. We discussed the importance of control of vascular risk factors, physical exercise, and brain stimulation exercises for brain health. He will follow-up in 5-6 months and knows to call for any changes.   Medications: Diagnostic testing: Labs: Imaging: Consults: Return to clinic in *** months.  Thank you for allowing me to participate in *** care.  Please do not hesitate to call for any questions or concerns.  The duration of this appointment visit was *** minutes of face-to-face time with the patient.  Greater than 50% of this time was spent in counseling, explanation of diagnosis, planning of further management, and coordination of care.   Ellouise Newer, M.D.   CC: ***

## 2017-12-09 ENCOUNTER — Ambulatory Visit: Payer: Medicare Other | Admitting: Neurology

## 2017-12-13 DIAGNOSIS — Z87891 Personal history of nicotine dependence: Secondary | ICD-10-CM | POA: Diagnosis not present

## 2017-12-13 DIAGNOSIS — Z87442 Personal history of urinary calculi: Secondary | ICD-10-CM | POA: Diagnosis not present

## 2017-12-13 DIAGNOSIS — I10 Essential (primary) hypertension: Secondary | ICD-10-CM | POA: Diagnosis not present

## 2017-12-13 DIAGNOSIS — Z7952 Long term (current) use of systemic steroids: Secondary | ICD-10-CM | POA: Diagnosis not present

## 2017-12-13 DIAGNOSIS — Z1321 Encounter for screening for nutritional disorder: Secondary | ICD-10-CM | POA: Diagnosis not present

## 2017-12-13 DIAGNOSIS — Z9189 Other specified personal risk factors, not elsewhere classified: Secondary | ICD-10-CM | POA: Diagnosis not present

## 2017-12-13 DIAGNOSIS — E559 Vitamin D deficiency, unspecified: Secondary | ICD-10-CM | POA: Diagnosis not present

## 2017-12-13 DIAGNOSIS — Z79899 Other long term (current) drug therapy: Secondary | ICD-10-CM | POA: Diagnosis not present

## 2017-12-16 ENCOUNTER — Other Ambulatory Visit: Payer: Self-pay | Admitting: Osteopathic Medicine

## 2017-12-17 ENCOUNTER — Other Ambulatory Visit: Payer: Self-pay | Admitting: Osteopathic Medicine

## 2017-12-17 DIAGNOSIS — M7989 Other specified soft tissue disorders: Secondary | ICD-10-CM

## 2017-12-25 DIAGNOSIS — Z7952 Long term (current) use of systemic steroids: Secondary | ICD-10-CM | POA: Diagnosis not present

## 2017-12-25 DIAGNOSIS — E559 Vitamin D deficiency, unspecified: Secondary | ICD-10-CM | POA: Diagnosis not present

## 2017-12-25 DIAGNOSIS — Z87891 Personal history of nicotine dependence: Secondary | ICD-10-CM | POA: Diagnosis not present

## 2017-12-25 DIAGNOSIS — M810 Age-related osteoporosis without current pathological fracture: Secondary | ICD-10-CM | POA: Diagnosis not present

## 2017-12-25 DIAGNOSIS — Z1321 Encounter for screening for nutritional disorder: Secondary | ICD-10-CM | POA: Diagnosis not present

## 2017-12-25 DIAGNOSIS — Z79899 Other long term (current) drug therapy: Secondary | ICD-10-CM | POA: Diagnosis not present

## 2017-12-25 DIAGNOSIS — Z9189 Other specified personal risk factors, not elsewhere classified: Secondary | ICD-10-CM | POA: Diagnosis not present

## 2017-12-25 DIAGNOSIS — Z87442 Personal history of urinary calculi: Secondary | ICD-10-CM | POA: Diagnosis not present

## 2017-12-31 DIAGNOSIS — Z961 Presence of intraocular lens: Secondary | ICD-10-CM | POA: Diagnosis not present

## 2017-12-31 DIAGNOSIS — H353134 Nonexudative age-related macular degeneration, bilateral, advanced atrophic with subfoveal involvement: Secondary | ICD-10-CM | POA: Diagnosis not present

## 2017-12-31 DIAGNOSIS — H43812 Vitreous degeneration, left eye: Secondary | ICD-10-CM | POA: Diagnosis not present

## 2017-12-31 DIAGNOSIS — H26493 Other secondary cataract, bilateral: Secondary | ICD-10-CM | POA: Diagnosis not present

## 2017-12-31 DIAGNOSIS — B353 Tinea pedis: Secondary | ICD-10-CM | POA: Diagnosis not present

## 2017-12-31 DIAGNOSIS — L905 Scar conditions and fibrosis of skin: Secondary | ICD-10-CM | POA: Diagnosis not present

## 2017-12-31 DIAGNOSIS — L821 Other seborrheic keratosis: Secondary | ICD-10-CM | POA: Diagnosis not present

## 2017-12-31 DIAGNOSIS — H35373 Puckering of macula, bilateral: Secondary | ICD-10-CM | POA: Diagnosis not present

## 2018-01-10 ENCOUNTER — Telehealth: Payer: Self-pay

## 2018-01-10 NOTE — Telephone Encounter (Signed)
Pt's daughter called stating that her father is scheduled to do a iron IV transfusion on 01/16/18. She requesting feed back from provider - "in her father's health conditions & medications, would it be safe for him to proceed with procedure". Pt's daughter is is apprehensive due to his severe arthritis. Pls advise, thanks.

## 2018-01-10 NOTE — Telephone Encounter (Signed)
Yes that's fine.  Has no relation to arthritis.

## 2018-01-12 ENCOUNTER — Encounter: Payer: Self-pay | Admitting: Emergency Medicine

## 2018-01-12 ENCOUNTER — Other Ambulatory Visit: Payer: Self-pay

## 2018-01-12 ENCOUNTER — Emergency Department (INDEPENDENT_AMBULATORY_CARE_PROVIDER_SITE_OTHER)
Admission: EM | Admit: 2018-01-12 | Discharge: 2018-01-12 | Disposition: A | Discharge status: Home / Self Care | Payer: Medicare Other | Source: Home / Self Care | Attending: Emergency Medicine | Admitting: Emergency Medicine

## 2018-01-12 ENCOUNTER — Other Ambulatory Visit: Payer: Self-pay | Admitting: Osteopathic Medicine

## 2018-01-12 DIAGNOSIS — Z885 Allergy status to narcotic agent status: Secondary | ICD-10-CM | POA: Diagnosis not present

## 2018-01-12 DIAGNOSIS — R531 Weakness: Secondary | ICD-10-CM | POA: Diagnosis not present

## 2018-01-12 DIAGNOSIS — J449 Chronic obstructive pulmonary disease, unspecified: Secondary | ICD-10-CM | POA: Diagnosis not present

## 2018-01-12 DIAGNOSIS — R079 Chest pain, unspecified: Secondary | ICD-10-CM | POA: Diagnosis not present

## 2018-01-12 DIAGNOSIS — E1122 Type 2 diabetes mellitus with diabetic chronic kidney disease: Secondary | ICD-10-CM | POA: Diagnosis not present

## 2018-01-12 DIAGNOSIS — R9431 Abnormal electrocardiogram [ECG] [EKG]: Secondary | ICD-10-CM | POA: Diagnosis not present

## 2018-01-12 DIAGNOSIS — K219 Gastro-esophageal reflux disease without esophagitis: Secondary | ICD-10-CM | POA: Diagnosis not present

## 2018-01-12 DIAGNOSIS — Z79899 Other long term (current) drug therapy: Secondary | ICD-10-CM | POA: Diagnosis not present

## 2018-01-12 DIAGNOSIS — I2510 Atherosclerotic heart disease of native coronary artery without angina pectoris: Secondary | ICD-10-CM | POA: Diagnosis not present

## 2018-01-12 DIAGNOSIS — E785 Hyperlipidemia, unspecified: Secondary | ICD-10-CM | POA: Diagnosis not present

## 2018-01-12 DIAGNOSIS — R0600 Dyspnea, unspecified: Secondary | ICD-10-CM

## 2018-01-12 DIAGNOSIS — Z87891 Personal history of nicotine dependence: Secondary | ICD-10-CM | POA: Diagnosis not present

## 2018-01-12 DIAGNOSIS — N182 Chronic kidney disease, stage 2 (mild): Secondary | ICD-10-CM | POA: Diagnosis not present

## 2018-01-12 DIAGNOSIS — Z888 Allergy status to other drugs, medicaments and biological substances status: Secondary | ICD-10-CM | POA: Diagnosis not present

## 2018-01-12 DIAGNOSIS — R404 Transient alteration of awareness: Secondary | ICD-10-CM | POA: Diagnosis not present

## 2018-01-12 DIAGNOSIS — R05 Cough: Secondary | ICD-10-CM | POA: Diagnosis not present

## 2018-01-12 DIAGNOSIS — R0902 Hypoxemia: Secondary | ICD-10-CM | POA: Diagnosis not present

## 2018-01-12 DIAGNOSIS — Z7982 Long term (current) use of aspirin: Secondary | ICD-10-CM | POA: Diagnosis not present

## 2018-01-12 DIAGNOSIS — J441 Chronic obstructive pulmonary disease with (acute) exacerbation: Secondary | ICD-10-CM | POA: Diagnosis not present

## 2018-01-12 DIAGNOSIS — Z7952 Long term (current) use of systemic steroids: Secondary | ICD-10-CM | POA: Diagnosis not present

## 2018-01-12 LAB — POCT INFLUENZA A/B
INFLUENZA A, POC: NEGATIVE
INFLUENZA B, POC: NEGATIVE
Influenza A, POC: NEGATIVE
Influenza B, POC: NEGATIVE

## 2018-01-12 MED ORDER — IPRATROPIUM-ALBUTEROL 0.5-2.5 (3) MG/3ML IN SOLN
3.0000 mL | Freq: Once | RESPIRATORY_TRACT | Status: AC
  Administered 2018-01-12: 3 mL via RESPIRATORY_TRACT

## 2018-01-12 NOTE — ED Triage Notes (Signed)
Patient having worsening SOB which started last night, productive cough, patient has a history of COPD.

## 2018-01-12 NOTE — ED Provider Notes (Signed)
[name]    CSN: [id number] Arrival date & time: 01/12/18  1119     History   Chief Complaint Chief Complaint  Patient presents with  . Shortness of Breath    HPI Bill Hinton is a 84 y.o. male.    Shortness of Breath  Associated symptoms: chest pain   Patient was in his usual state of health until this morning when he became acutely ill.  He awakened complaining of chest pain which he states started during the night.  This is an aching sensation across his lower chest on the right and the left.  He felt more short of breath this morning.  There has not been any definite fever.  He has had a flu shot.  He was given a DuoNeb treatment this morning along with a steroid treatment.  He continued to be short of breath and with complaints of aching all over.  He was brought to the lobby where he had immediately be put in a wheelchair so he would not faint.  He was brought back emergently and started on O2 for a pulse ox of 88%  Past Medical History:  Diagnosis Date  . AAA (abdominal aortic aneurysm) ([place]) 07/23/2017   US done 07/22/17: 2.7 x 3.2 cm  Recommend followup by ultrasound in 3 years  . COPD (chronic obstructive pulmonary disease) ([place])   . Reflux   . Spontaneous pneumothorax     Patient Active Problem List   Diagnosis Date Noted  . Skin cancer 10/04/2017  . Chronic pain of left knee 10/04/2017  . Chronic systolic congestive heart failure ([place]) 10/04/2017  . Elevated serum creatinine 10/04/2017  . AAA (abdominal aortic aneurysm) ([place]) 07/23/2017  . Aorto-iliac atherosclerosis ([place]) 07/19/2017  . Sepsis due to cellulitis ([place]) 05/02/2017  . Mass of upper lobe of right lung 04/09/2017  . Thrombocytopenia ([place]) 04/09/2017  . Benign prostatic hyperplasia 12/14/2016  . CAD (coronary artery disease), native coronary artery 12/14/2016  . Depression with anxiety 12/14/2016  . History of nephrolithiasis 12/14/2016  . Pneumothorax 12/14/2016  . Tinnitus  12/14/2016  . Vitamin D deficiency 12/14/2016  . Rhinorrhea 12/14/2016  . Declining functional status 09/26/2016  . Hospital-acquired pneumonia 09/19/2016  . Restless leg syndrome 09/19/2016  . Acute respiratory failure with hypoxia ([place]) 09/17/2016  . Bell's palsy 09/17/2016  . Bronchiectasis ([place]) 09/17/2016  . 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 Dysphagia 08/17/2016  . Vasomotor rhinitis 08/17/2016  . Right ureteral calculus 04/13/2015  . Atherosclerotic heart disease of native coronary artery without angina pectoris 04/11/2015  . Chronic kidney disease 04/11/2015  . Diaphragmatic hernia without obstruction or gangrene 04/11/2015  . Enlarged prostate without lower urinary tract symptoms (luts) 04/11/2015  . Essential (primary) hypertension 04/11/2015  . Nocturia 04/11/2015  . Other seasonal allergic rhinitis 04/11/2015  . Panic disorder without agoraphobia 04/11/2015  . Status post cataract extraction 01/21/2014  . Presence of intraocular lens 01/21/2014  . Pseudophakia of both eyes 01/21/2014  . Nuclear sclerosis of right eye 01/19/2014  . 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 Pterygium of left eye 05/29/2012  . Hyperlipidemia 12/30/2008  . Steroid-dependent chronic obstructive pulmonary disease ([place]) 12/30/2008  . Gastroesophageal reflux disease without esophagitis 12/30/2008    Past Surgical History:  Procedure Laterality Date  . CHOLECYSTECTOMY    . HERNIA REPAIR         Home Medications    Prior to Admission medications   Medication  Sig Start Date End Date Taking? Authorizing Provider  acetaminophen (TYLENOL) 500 MG tablet Take 1-2 tablets (500-1,000 mg total) every 6 (six) hours as needed by mouth. 08/21/17   Emeterio Reeve, DO  aspirin 81 MG chewable tablet Chew 1 tablet (81 mg total) by mouth daily. 07/05/17   Emeterio Reeve, DO  Cholecalciferol (VITAMIN D-3) 1000 units  CAPS Take 1 capsule by mouth daily.    [provider]  citalopram (CELEXA) 40 MG tablet TAKE 1 TABLET(40 MG) BY MOUTH DAILY 10/10/17   Emeterio Reeve, DO  clonazePAM [place]bye [place]) 0.5 MG tablet Take 1/2 tablet 30 minutes before bedtime 07/12/17   Cameron Sprang, MD  donepezil (ARICEPT) 10 MG tablet Take 1 tablet (10 mg total) by mouth daily. 10/25/17   Cameron Sprang, MD  furosemide (LASIX) 20 MG tablet Take 1 tablet (20 mg total) by mouth daily. Can take 2 tablets (40 mg) If increased swelling or weight gain 05/17/17   Emeterio Reeve, DO  furosemide (LASIX) 20 MG tablet TAKE 1 TABLET BY MOUTH DAILY 12/17/17   Emeterio Reeve, DO  ipratropium-albuterol (DUONEB) 0.5-2.5 (3) MG/3ML SOLN Inhale into the lungs. 12/13/15   [provider]  lansoprazole (PREVACID) 30 MG capsule Take 1 capsule (30 mg total) by mouth daily at 12 noon. 07/05/17   Emeterio Reeve, DO  montelukast (SINGULAIR) 10 MG tablet Take 10 mg by mouth daily. 11/16/16   [provider]  potassium chloride (KLOR-CON) 20 MEQ packet Take 20 mEq by mouth 2 (two) times daily. 10/11/17   Emeterio Reeve, DO  predniSONE (DELTASONE) 10 MG tablet Take 3 by mouth (30 mg) with breakfast daily for 1 week until you see your lung dr. 10/15/17   Jacqulyn Cane, MD  QUEtiapine (SEROQUEL) 50 MG tablet Take 1 tablet (50 mg total) by mouth at bedtime. 07/12/17   Cameron Sprang, MD  rosuvastatin (CRESTOR) 5 MG tablet Take 1 tablet (5 mg total) by mouth at bedtime. 03/08/17   Emeterio Reeve, DO    Family History Family History  Problem Relation Age of Onset  . Heart attack Mother   . Heart attack Father     Social History Social History   Tobacco Use  . Smoking status: Former Smoker    Last attempt to quit: 10/15/1998    Years since quitting: 19.2  . Smokeless tobacco: Never Used  Substance Use Topics  . Alcohol use: No  . Drug use: No     Allergies   Fluticasone-salmeterol; Cefepime; Hydromorphone hcl; and  Theophylline   Review of Systems Review of Systems  Constitutional: Negative.   HENT: Positive for congestion.   Respiratory: Positive for chest tightness and shortness of breath.   Cardiovascular: Positive for chest pain. Negative for leg swelling.  Gastrointestinal: Negative.      Physical Exam Triage Vital Signs ED Triage Vitals  Enc Vitals Group     BP 01/12/18 1128 (!) 94/51     Pulse Rate 01/12/18 1128 (!) 103     Resp --      Temp 01/12/18 1128 98.2 F (36.8 C)     Temp Source 01/12/18 1128 Oral     SpO2 01/12/18 1128 (!) 88 %     Weight 01/12/18 1129 147 lb (66.7 kg)     Height 01/12/18 1129 5\' 9"  (1.753 m)     Head Circumference --      Peak Flow --      Pain Score 01/12/18 1129 0  Pain Loc --      Pain Edu? --      Excl. in [place]? --    No data found.  Updated Vital Signs BP (!) 94/51 (BP Location: Right Arm)   Pulse (!) 103   Temp 98.2 F (36.8 C) (Oral)   Ht 5\' 9"  (1.753 m)   Wt 147 lb (66.7 kg)   SpO2 (!) 88%   BMI 21.71 kg/m   Visual Acuity Right Eye Distance:   Left Eye Distance:   Bilateral Distance:    Right Eye Near:   Left Eye Near:    Bilateral Near:     Physical Exam  Constitutional:  This is an ill-appearing male who does not appear toxic.  Neck: Normal range of motion.  Cardiovascular: Normal rate and regular rhythm.  Pulmonary/Chest:  There are decreased breath sounds bilaterally but significant decrease on the left.  There are wheezes present.  Musculoskeletal:  Extremities are without edema.     UC Treatments / Results  Labs (all labs ordered are listed, but only abnormal results are displayed) Labs Reviewed  POCT INFLUENZA A/B    EKG None nonspecific T changes with PVCs and sinus arrhythmia.  QT was prolonged.  QT was prolonged Radiology No results found.  Procedures Procedures (including critical care time)  Medications Ordered in UC Medications - No data to display   Initial Impression / Assessment and  Plan / UC Course  I have reviewed the triage vital signs and the nursing notes.  Pertinent labs & imaging results that were available during my care of the patient were reviewed by me and considered in my medical decision making (see chart for details). Patient presents with acute onset last night of shortness of breath, chest pain, worsening cough, and severe diffuse myalgias.  History would be consistent with developing pneumonia versus flu syndrome.  Flu swab was done.  EKG had nonspecific T changes but no ST elevation.  He did have PVCs.  Patient is borderline hypotensive.  On arrival his pulse ox was 88%.  He was placed on 2 L O2 will be given a DuoNeb since his EKG does not show a STEMI and be transported to the hospital for further evaluation.      Final Clinical Impressions(s) / UC Diagnoses   Final diagnoses:  Hypoxia  Dyspnea, unspecified type  Chest pain, unspecified type    ED Discharge Orders    None       Controlled Substance Prescriptions [place] Controlled Substance Registry consulted? Not Applicable   Darlyne Russian, MD 01/12/18 1207

## 2018-01-12 NOTE — Discharge Instructions (Signed)
Patient to be transported via EMS to the hospital for evaluation.

## 2018-01-13 NOTE — Telephone Encounter (Signed)
Pt's daughter Katharine Look has been updated of covering provider's note.

## 2018-01-15 ENCOUNTER — Telehealth: Payer: Self-pay

## 2018-01-15 NOTE — Telephone Encounter (Signed)
Left message on daughter's cell to call if any questions or concerns.

## 2018-01-16 DIAGNOSIS — Z9189 Other specified personal risk factors, not elsewhere classified: Secondary | ICD-10-CM | POA: Diagnosis not present

## 2018-01-22 DIAGNOSIS — I5022 Chronic systolic (congestive) heart failure: Secondary | ICD-10-CM | POA: Diagnosis not present

## 2018-01-22 DIAGNOSIS — R1312 Dysphagia, oropharyngeal phase: Secondary | ICD-10-CM | POA: Diagnosis not present

## 2018-01-22 DIAGNOSIS — J449 Chronic obstructive pulmonary disease, unspecified: Secondary | ICD-10-CM | POA: Diagnosis not present

## 2018-01-22 DIAGNOSIS — I10 Essential (primary) hypertension: Secondary | ICD-10-CM | POA: Diagnosis not present

## 2018-01-22 DIAGNOSIS — J479 Bronchiectasis, uncomplicated: Secondary | ICD-10-CM | POA: Diagnosis not present

## 2018-01-27 ENCOUNTER — Other Ambulatory Visit: Payer: Self-pay | Admitting: Osteopathic Medicine

## 2018-01-28 DIAGNOSIS — R05 Cough: Secondary | ICD-10-CM | POA: Diagnosis not present

## 2018-01-28 DIAGNOSIS — J449 Chronic obstructive pulmonary disease, unspecified: Secondary | ICD-10-CM | POA: Diagnosis not present

## 2018-01-28 DIAGNOSIS — R918 Other nonspecific abnormal finding of lung field: Secondary | ICD-10-CM | POA: Diagnosis not present

## 2018-01-28 DIAGNOSIS — I5022 Chronic systolic (congestive) heart failure: Secondary | ICD-10-CM | POA: Diagnosis not present

## 2018-01-28 DIAGNOSIS — J479 Bronchiectasis, uncomplicated: Secondary | ICD-10-CM | POA: Diagnosis not present

## 2018-01-28 DIAGNOSIS — I700 Atherosclerosis of aorta: Secondary | ICD-10-CM | POA: Diagnosis not present

## 2018-01-28 DIAGNOSIS — J439 Emphysema, unspecified: Secondary | ICD-10-CM | POA: Diagnosis not present

## 2018-01-28 DIAGNOSIS — R1312 Dysphagia, oropharyngeal phase: Secondary | ICD-10-CM | POA: Diagnosis not present

## 2018-02-07 ENCOUNTER — Encounter: Payer: Self-pay | Admitting: Osteopathic Medicine

## 2018-02-07 ENCOUNTER — Ambulatory Visit (INDEPENDENT_AMBULATORY_CARE_PROVIDER_SITE_OTHER): Payer: Medicare Other | Admitting: Osteopathic Medicine

## 2018-02-07 ENCOUNTER — Telehealth: Payer: Self-pay

## 2018-02-07 VITALS — BP 114/66 | HR 84 | Temp 98.0°F | Wt 142.6 lb

## 2018-02-07 DIAGNOSIS — R7303 Prediabetes: Secondary | ICD-10-CM | POA: Diagnosis not present

## 2018-02-07 DIAGNOSIS — I5022 Chronic systolic (congestive) heart failure: Secondary | ICD-10-CM | POA: Diagnosis not present

## 2018-02-07 DIAGNOSIS — J449 Chronic obstructive pulmonary disease, unspecified: Secondary | ICD-10-CM

## 2018-02-07 DIAGNOSIS — R4189 Other symptoms and signs involving cognitive functions and awareness: Secondary | ICD-10-CM

## 2018-02-07 DIAGNOSIS — S40811A Abrasion of right upper arm, initial encounter: Secondary | ICD-10-CM | POA: Diagnosis not present

## 2018-02-07 LAB — POCT GLYCOSYLATED HEMOGLOBIN (HGB A1C): Hemoglobin A1C: 6.1

## 2018-02-07 MED ORDER — MUPIROCIN 2 % EX OINT: TOPICAL_OINTMENT | CUTANEOUS | 0 refills | Status: AC

## 2018-02-07 NOTE — Telephone Encounter (Signed)
The pharmacy called and states the patient is waiting a prescription for his wound. I advised there was no medications sent by Dr Sheppard Coil today and the note doesn't mention a prescription.

## 2018-02-07 NOTE — Telephone Encounter (Signed)
Per Sheppard Coil, Rx sent for mupirocin ointment (BACTROBAN) 2 % Dose, Route, Frequency: As Directed   Dispense Quantity: 22 g Refills: 0 Fills remaining: --        Sig: Apply twice daily with dressing change for one week.

## 2018-02-07 NOTE — Progress Notes (Signed)
HPI: Bill Hinton is a 84 y.o. male who  has a past medical history of AAA (abdominal aortic aneurysm) ([place]) (07/23/2017), COPD (chronic obstructive pulmonary disease) ([place]), Reflux, and Spontaneous pneumothorax.  he presents to [facility] today, 02/07/18,  for chief complaint of: Recheck chronic issues, new fall concern, prediabetes - see headings  Fall earlier this week, hurt R arm and L shoulder, does not want to get XR. Skin is abraded on R arm, R wrist, L shoulder.  Family reports he was going to the bathroom fell asleep and fell forward.  Borderline prediabetic/diabetic range sugars, A1C last visit 6.5%. Today 6.1%  COPD/Bronectasis: following with ID and Pulm. ER visit 01/12/18 for exacerbation last month. BP a bit low 104/57. No hypoxia in ER. D/C home on abx and steroids. Recent ID visit 01/22/18 recommended f/u CT and if change would get bronch. CT looked stable/improved, repeat 6 mos. Breathing today is   CHF: last cariology visit 12/05/17, stable, advised f/u 1 yr. No orthopnea or LE edema.   Patient is accompanied by daughter Katharine Look and wife Holley Raring who assist with history-taking.    Past medical, surgical, social and family history reviewed:  Patient Active Problem List   Diagnosis Date Noted  . Skin cancer 10/04/2017  . Chronic pain of left knee 10/04/2017  . Chronic systolic congestive heart failure ([place]) 10/04/2017  . Elevated serum creatinine 10/04/2017  . AAA (abdominal aortic aneurysm) ([place]) 07/23/2017  . Aorto-iliac atherosclerosis ([place]) 07/19/2017  . Sepsis due to cellulitis ([place]) 05/02/2017  . Mass of upper lobe of right lung 04/09/2017  . Thrombocytopenia ([place]) 04/09/2017  . Benign prostatic hyperplasia 12/14/2016  . CAD (coronary artery disease), native coronary artery 12/14/2016  . Depression with anxiety 12/14/2016  . History of nephrolithiasis 12/14/2016  . Pneumothorax 12/14/2016  . Tinnitus 12/14/2016  . Vitamin D  deficiency 12/14/2016  . Rhinorrhea 12/14/2016  . Declining functional status 09/26/2016  . Hospital-acquired pneumonia 09/19/2016  . Restless leg syndrome 09/19/2016  . Acute respiratory failure with hypoxia ([place]) 09/17/2016  . Bell's palsy 09/17/2016  . Bronchiectasis ([place]) 09/17/2016  . 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 Dysphagia 08/17/2016  . Vasomotor rhinitis 08/17/2016  . Right ureteral calculus 04/13/2015  . Atherosclerotic heart disease of native coronary artery without angina pectoris 04/11/2015  . Chronic kidney disease 04/11/2015  . Diaphragmatic hernia without obstruction or gangrene 04/11/2015  . Enlarged prostate without lower urinary tract symptoms (luts) 04/11/2015  . Essential (primary) hypertension 04/11/2015  . Nocturia 04/11/2015  . Other seasonal allergic rhinitis 04/11/2015  . Panic disorder without agoraphobia 04/11/2015  . Status post cataract extraction 01/21/2014  . Presence of intraocular lens 01/21/2014  . Pseudophakia of both eyes 01/21/2014  . Nuclear sclerosis of right eye 01/19/2014  . 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 Pterygium of left eye 05/29/2012  . Hyperlipidemia 12/30/2008  . Steroid-dependent chronic obstructive pulmonary disease ([place]) 12/30/2008  . Gastroesophageal reflux disease without esophagitis 12/30/2008    Past Surgical History:  Procedure Laterality Date  . CHOLECYSTECTOMY    . HERNIA REPAIR      Social History   Tobacco Use  . Smoking status: Former Smoker    Last attempt to quit: 10/15/1998    Years since quitting: 19.3  . Smokeless tobacco: Never Used  Substance Use Topics  . Alcohol use: No    Family History  Problem Relation Age of Onset  . Heart attack  Mother   . Heart attack Father      Current medication list and allergy/intolerance information reviewed:    Current Outpatient Medications  Medication Sig Dispense  Refill  . acetaminophen (TYLENOL) 500 MG tablet Take 1-2 tablets (500-1,000 mg total) every 6 (six) hours as needed by mouth. 90 tablet 1  . aspirin 81 MG chewable tablet Chew 1 tablet (81 mg total) by mouth daily. 90 tablet 3  . Cholecalciferol (VITAMIN D-3) 1000 units CAPS Take 1 capsule by mouth daily.    . citalopram (CELEXA) 40 MG tablet Take 1 tablet (40 mg total) by mouth daily. Pt is due for a f/u appt w/PCP 30 tablet 0  . clonazePAM (KLONOPIN) 0.5 MG tablet Take 1/2 tablet 30 minutes before bedtime 15 tablet 5  . donepezil (ARICEPT) 10 MG tablet Take 1 tablet (10 mg total) by mouth daily. 90 tablet 3  . furosemide (LASIX) 20 MG tablet Take 1 tablet (20 mg total) by mouth daily. Can take 2 tablets (40 mg) If increased swelling or weight gain 90 tablet 1  . furosemide (LASIX) 20 MG tablet TAKE 1 TABLET BY MOUTH DAILY 90 tablet 0  . ipratropium-albuterol (DUONEB) 0.5-2.5 (3) MG/3ML SOLN Inhale into the lungs.    . lansoprazole (PREVACID) 30 MG capsule Take 1 capsule (30 mg total) by mouth daily at 12 noon. 90 capsule 3  . montelukast (SINGULAIR) 10 MG tablet Take 10 mg by mouth daily.  11  . potassium chloride (KLOR-CON) 20 MEQ packet Take 20 mEq by mouth 2 (two) times daily. 100 packet 3  . predniSONE (DELTASONE) 10 MG tablet Take 3 by mouth (30 mg) with breakfast daily for 1 week until you see your lung dr. 21 tablet 0  . QUEtiapine (SEROQUEL) 50 MG tablet Take 1 tablet (50 mg total) by mouth at bedtime. 30 tablet 6  . rosuvastatin (CRESTOR) 5 MG tablet Take 1 tablet (5 mg total) by mouth at bedtime. 90 tablet 3   No current facility-administered medications for this visit.     Allergies  Allergen Reactions  . Cefepime Other (See Comments)    Myoclonus Myoclonus  . Fluticasone-Salmeterol Anaphylaxis and Other (See Comments)    Other reaction(s): Other (See Comments) Blisters in mouth Blisters in mouth Uses DuoNeb at home without reactions  Other reaction(s): Other (See  Comments) Blisters in mouth Blisters in mouth Uses DuoNeb at home without reactions Other reaction(s): Other (See Comments) Other reaction(s): Other (See Comments) Blisters in mouth Blisters in mouth Blisters in mouth Blisters in mouth   . Hydromorphone Hcl   . Theophylline     Other reaction(s): Other (See Comments) Throat irritation      Review of Systems:  Constitutional:  No  fever, no chills  HEENT: No  headache  Cardiac: No  chest pain, No  pressure, No palpitations, No  Orthopnea  Respiratory:  +chronic stable shortness of breath and Cough  Gastrointestinal: No  abdominal pain, No  nausea  Musculoskeletal: +new myalgia/arthralgia  Skin: No  Rash, +other wound as HPI    Exam:  BP 114/66 (BP Location: Left Arm, Patient Position: Sitting, Cuff Size: Small)   Pulse 84   Temp 98 F (36.7 C) (Oral)   Wt 142 lb 9.6 oz (64.7 kg)   BMI 21.06 kg/m   Constitutional: VS see above. General Appearance: alert, frail, NAD  Ears, Nose, Mouth, Throat: MMM, Normal external inspection ears/nares/mouth/lips/gums.   Neck: No masses, trachea midline.  Respiratory: Normal respiratory effort. no  wheeze, no rhonchi, no rales  Cardiovascular: S1/S2 normal, RRR. No lower extremity edema.   Musculoskeletal: Gait normal.   Neurological: Normal balance/coordination. No tremor.   Skin: warm, dry, extensive chronic bruising.  Abraded skin left shoulder, right elbow, R wrist,  normal granulation tissue. No surrounding erythema, no concerning drainage  Psychiatric: Normal judgment/insight. Normal mood and affect. Oriented x3.    Results for orders placed or performed in visit on 02/07/18 (from the past 72 hour(s))  POCT HgB A1C     Status: None   Collection Time: 02/07/18  9:31 AM  Result Value Ref Range   Hemoglobin A1C 6.1     Other labs reviewed in care everywhere, high WBC c/w infection, no other concerns      ASSESSMENT/PLAN:   Arm abrasion, right, initial  encounter - Bandaging applied by myself, family declines home health referral for wound care  Pre-diabetes - stable - Plan: POCT HgB I1W  Chronic systolic congestive heart failure (HCC) - stable  Steroid-dependent chronic obstructive pulmonary disease ([place]) - stable  Cognitive decline - stable      Visit summary with medication list and pertinent instructions was printed for patient to review. All questions at time of visit were answered - patient instructed to contact office with any additional concerns. ER/RTC precautions were reviewed with the patient.   Follow-up plan: Return in about 1 week (around 02/14/2018) for recheck arm wound.  Note: Total time spent 40 minutes, greater than 50% of the visit was spent face-to-face counseling and coordinating care for the following: The primary encounter diagnosis was Arm abrasion, right, initial encounter. Diagnoses of Pre-diabetes, Chronic systolic congestive heart failure ([place]), Steroid-dependent chronic obstructive pulmonary disease ([place]), and Cognitive decline were also pertinent to this visit.Marland Kitchen  Please note: voice recognition software was used to produce this document, and typos may escape review. Please contact Dr. Sheppard Coil for any needed clarifications.

## 2018-02-14 ENCOUNTER — Ambulatory Visit (INDEPENDENT_AMBULATORY_CARE_PROVIDER_SITE_OTHER): Payer: Medicare Other | Admitting: Osteopathic Medicine

## 2018-02-14 ENCOUNTER — Encounter: Payer: Self-pay | Admitting: Osteopathic Medicine

## 2018-02-14 ENCOUNTER — Other Ambulatory Visit: Payer: Self-pay | Admitting: Osteopathic Medicine

## 2018-02-14 VITALS — BP 116/54 | HR 69 | Temp 97.8°F | Wt 153.4 lb

## 2018-02-14 DIAGNOSIS — I5022 Chronic systolic (congestive) heart failure: Secondary | ICD-10-CM

## 2018-02-14 DIAGNOSIS — S40811A Abrasion of right upper arm, initial encounter: Secondary | ICD-10-CM

## 2018-02-14 NOTE — Progress Notes (Signed)
HPI: Bill Hinton is a 84 y.o. male who  has a past medical history of AAA (abdominal aortic aneurysm) ([place]) (07/23/2017), COPD (chronic obstructive pulmonary disease) ([place]), Reflux, and Spontaneous pneumothorax.  he presents to [facility] today, 02/14/18,  for chief complaint of: Recheck arm abrasion  Fall earlier last week, hurt R arm and L shoulder, did not want to get XR. Skin is abraded on R arm, R wrist, L shoulder.  Family reports he was going to the bathroom fell asleep and fell forward.  Last visit, I instructed them on proper bandaging of this area, use of nonstick gauze to raw areas, infection prevention.  Offered home health referral for wound care but they declined this service at that time.  Today, on recheck no concerns. Family is providing wound care. Leaving it to air during the day.    Concerned about weight gain. No new/worsening SOB, no LE swelling or chest pain.   Patient is accompanied by daughter Katharine Look and wife Holley Raring who assist with history-taking.    Past medical, surgical, social and family history reviewed.  Current medication list and allergy/intolerance information reviewed:    Current Outpatient Medications  Medication Sig Dispense Refill  . acetaminophen (TYLENOL) 500 MG tablet Take 1-2 tablets (500-1,000 mg total) every 6 (six) hours as needed by mouth. 90 tablet 1  . aspirin 81 MG chewable tablet Chew 1 tablet (81 mg total) by mouth daily. 90 tablet 3  . Cholecalciferol (VITAMIN D-3) 1000 units CAPS Take 1 capsule by mouth daily.    . citalopram (CELEXA) 40 MG tablet Take 1 tablet (40 mg total) by mouth daily. Pt is due for a f/u appt w/PCP 30 tablet 0  . clonazePAM (KLONOPIN) 0.5 MG tablet Take 1/2 tablet 30 minutes before bedtime 15 tablet 5  . donepezil (ARICEPT) 10 MG tablet Take 1 tablet (10 mg total) by mouth daily. 90 tablet 3  . furosemide (LASIX) 20 MG tablet Take 1 tablet (20 mg total) by mouth daily. Can take  2 tablets (40 mg) If increased swelling or weight gain 90 tablet 1  . ipratropium-albuterol (DUONEB) 0.5-2.5 (3) MG/3ML SOLN Inhale into the lungs.    . lansoprazole (PREVACID) 30 MG capsule Take 1 capsule (30 mg total) by mouth daily at 12 noon. (Patient not taking: Reported on 02/07/2018) 90 capsule 3  . montelukast (SINGULAIR) 10 MG tablet Take 10 mg by mouth daily.  11  . mupirocin ointment (BACTROBAN) 2 % Apply twice daily with dressing change for one week. 22 g 0  . potassium chloride (KLOR-CON) 20 MEQ packet Take 20 mEq by mouth 2 (two) times daily. 100 packet 3  . predniSONE (DELTASONE) 10 MG tablet Take 3 by mouth (30 mg) with breakfast daily for 1 week until you see your lung dr. (Patient taking differently: Take 10 mg by mouth. Take 1 by mouth (10 mg) with breakfast daily for 1 week until you see your lung dr.) 21 tablet 0  . QUEtiapine (SEROQUEL) 50 MG tablet Take 1 tablet (50 mg total) by mouth at bedtime. 30 tablet 6  . rosuvastatin (CRESTOR) 5 MG tablet Take 1 tablet (5 mg total) by mouth at bedtime. 90 tablet 3   No current facility-administered medications for this visit.     Allergies  Allergen Reactions  . Cefepime Other (See Comments)    Myoclonus Myoclonus  . Fluticasone-Salmeterol Anaphylaxis and Other (See Comments)    Other reaction(s): Other (See Comments) Blisters in mouth  Blisters in mouth Uses DuoNeb at home without reactions  Other reaction(s): Other (See Comments) Blisters in mouth Blisters in mouth Uses DuoNeb at home without reactions Other reaction(s): Other (See Comments) Other reaction(s): Other (See Comments) Blisters in mouth Blisters in mouth Blisters in mouth Blisters in mouth   . Hydromorphone Hcl   . Theophylline     Other reaction(s): Other (See Comments) Throat irritation      Review of Systems:  Constitutional:  No  fever, no chills  HEENT: No  headache  Cardiac: No  chest pain, No  pressure, No palpitations, No   Orthopnea  Respiratory:  +chronic stable shortness of breath and Cough  Gastrointestinal: No  abdominal pain, No  nausea  Musculoskeletal: +new myalgia/arthralgia  Skin: No  Rash, +other wound as HPI    Exam:  BP (!) 116/54 (BP Location: Left Arm, Patient Position: Sitting, Cuff Size: Small)   Pulse 69   Temp 97.8 F (36.6 C) (Oral)   Wt 153 lb 6.4 oz (69.6 kg)   BMI 22.65 kg/m   Constitutional: VS see above. General Appearance: alert, frail, NAD  Ears, Nose, Mouth, Throat: MMM, Normal external inspection ears/nares/mouth/lips/gums.   Neck: No masses, trachea midline.  Respiratory: Normal respiratory effort. Diffuse coarse breath sounds stable from previous exams. No rales.   Cardiovascular: S1/S2 normal, RRR. No lower extremity edema.   Musculoskeletal: Gait normal.   Skin: warm, dry, extensive chronic bruising.  Abraded skin left shoulder, right elbow, R wrist,  normal granulation tissue. No surrounding erythema, no concerning drainage. Healing well, normal granulation tissue present.   Psychiatric: Normal judgment/insight. Normal mood and affect. Oriented x3.      ASSESSMENT/PLAN:   Arm abrasion, right, initial encounter - healing well, continue wound care. I rebandaged the area with Telfa, petroleum jelly, gause and Ace wrap   Chronic systolic congestive heart failure ([place]) - no clinical CHF now, family needs to monitor weight at home and add dose of Lasix if weight is up       Visit summary with medication list and pertinent instructions was printed for patient to review. All questions at time of visit were answered - patient instructed to contact office with any additional concerns. ER/RTC precautions were reviewed with the patient.   Follow-up plan: Return in about 6 months (around 08/17/2018) for annual check-up .  Note: Total time spent 25 minutes, greater than 50% of the visit was spent face-to-face counseling and coordinating care for the following: The  primary encounter diagnosis was Arm abrasion, right, initial encounter. A diagnosis of Chronic systolic congestive heart failure (HCC) was also pertinent to this visit.Marland Kitchen  Please note: voice recognition software was used to produce this document, and typos may escape review. Please contact Dr. Sheppard Coil for any needed clarifications.

## 2018-02-19 DIAGNOSIS — R1312 Dysphagia, oropharyngeal phase: Secondary | ICD-10-CM | POA: Diagnosis not present

## 2018-02-19 DIAGNOSIS — J449 Chronic obstructive pulmonary disease, unspecified: Secondary | ICD-10-CM | POA: Diagnosis not present

## 2018-02-19 DIAGNOSIS — J479 Bronchiectasis, uncomplicated: Secondary | ICD-10-CM | POA: Diagnosis not present

## 2018-03-03 ENCOUNTER — Other Ambulatory Visit: Payer: Self-pay | Admitting: Osteopathic Medicine

## 2018-03-04 ENCOUNTER — Other Ambulatory Visit: Payer: Self-pay

## 2018-03-04 MED ORDER — CITALOPRAM HYDROBROMIDE 40 MG PO TABS: 40.0000 mg | ORAL_TABLET | Freq: Every day | ORAL | 0 refills | Status: DC

## 2018-03-09 ENCOUNTER — Other Ambulatory Visit: Payer: Self-pay | Admitting: Osteopathic Medicine

## 2018-04-08 DIAGNOSIS — R0902 Hypoxemia: Secondary | ICD-10-CM | POA: Diagnosis not present

## 2018-04-08 DIAGNOSIS — R918 Other nonspecific abnormal finding of lung field: Secondary | ICD-10-CM | POA: Diagnosis not present

## 2018-04-08 DIAGNOSIS — J449 Chronic obstructive pulmonary disease, unspecified: Secondary | ICD-10-CM | POA: Diagnosis not present

## 2018-04-08 DIAGNOSIS — J471 Bronchiectasis with (acute) exacerbation: Secondary | ICD-10-CM | POA: Diagnosis not present

## 2018-04-10 ENCOUNTER — Other Ambulatory Visit: Payer: Self-pay

## 2018-04-10 DIAGNOSIS — G2581 Restless legs syndrome: Secondary | ICD-10-CM

## 2018-04-10 MED ORDER — CLONAZEPAM 0.5 MG PO TABS: ORAL_TABLET | ORAL | 5 refills | Status: DC

## 2018-04-28 ENCOUNTER — Encounter: Payer: Self-pay | Admitting: Physician Assistant

## 2018-04-28 ENCOUNTER — Ambulatory Visit (INDEPENDENT_AMBULATORY_CARE_PROVIDER_SITE_OTHER): Payer: Medicare Other | Admitting: Physician Assistant

## 2018-04-28 VITALS — BP 133/73 | HR 67 | Wt 147.0 lb

## 2018-04-28 DIAGNOSIS — R21 Rash and other nonspecific skin eruption: Secondary | ICD-10-CM | POA: Diagnosis not present

## 2018-04-28 DIAGNOSIS — M25571 Pain in right ankle and joints of right foot: Secondary | ICD-10-CM | POA: Diagnosis not present

## 2018-04-28 DIAGNOSIS — G8929 Other chronic pain: Secondary | ICD-10-CM | POA: Diagnosis not present

## 2018-04-28 MED ORDER — CLOTRIMAZOLE-BETAMETHASONE 1-0.05 % EX CREA: 1.0000 "application " | TOPICAL_CREAM | Freq: Two times a day (BID) | CUTANEOUS | 1 refills | Status: DC

## 2018-04-28 MED ORDER — DICLOFENAC SODIUM 1 % TD GEL: 4.0000 g | Freq: Four times a day (QID) | TRANSDERMAL | 1 refills | Status: DC

## 2018-04-28 MED ORDER — FLUCONAZOLE 150 MG PO TABS: 150.0000 mg | ORAL_TABLET | Freq: Once | ORAL | 0 refills | Status: AC

## 2018-04-28 NOTE — Patient Instructions (Addendum)
voltaren gel.    Body Ringworm Body ringworm is an infection of the skin that often causes a ring-shaped rash. Body ringworm can affect any part of your skin. It can spread easily to others. Body ringworm is also called tinea corporis. What are the causes? This condition is caused by funguses called dermatophytes. The condition develops when these funguses grow out of control on the skin. You can get this condition if you touch a person or animal that has it. You can also get it if you share clothing, bedding, towels, or any other object with an infected person or pet. What increases the risk? This condition is more likely to develop in:  Athletes who often make skin-to-skin contact with other athletes, such as wrestlers.  People who share equipment and mats.  People with a weakened immune system.  What are the signs or symptoms? Symptoms of this condition include:  Itchy, raised red spots and bumps.  Red scaly patches.  A ring-shaped rash. The rash may have: ? A clear center. ? Scales or red bumps at its center. ? Redness near its borders. ? Dry and scaly skin on or around it.  How is this diagnosed? This condition can usually be diagnosed with a skin exam. A skin scraping may be taken from the affected area and examined under a microscope to see if the fungus is present. How is this treated? This condition may be treated with:  An antifungal cream or ointment.  An antifungal shampoo.  Antifungal medicines. These may be prescribed if your ringworm is severe, keeps coming back, or lasts a long time.  Follow these instructions at home:  Take over-the-counter and prescription medicines only as told by your health care provider.  If you were given an antifungal cream or ointment: ? Use it as told by your health care provider. ? Wash the infected area and dry it completely before applying the cream or ointment.  If you were given an antifungal shampoo: ? Use it as told  by your health care provider. ? Leave the shampoo on your body for 3-5 minutes before rinsing.  While you have a rash: ? Wear loose clothing to stop clothes from rubbing and irritating it. ? Wash or change your bed sheets every night.  If your pet has the same infection, take your pet to see a Animal nutritionist. How is this prevented?  Practice good hygiene.  Wear sandals or shoes in public places and showers.  Do not share personal items with others.  Avoid touching red patches of skin on other people.  Avoid touching pets that have bald spots.  If you touch an animal that has a bald spot, wash your hands. Contact a health care provider if:  Your rash continues to spread after 7 days of treatment.  Your rash is not gone in 4 weeks.  The area around your rash gets red, warm, tender, and swollen. This information is not intended to replace advice given to you by your health care provider. Make sure you discuss any questions you have with your health care provider. Document Released: 09/28/2000 Document Revised: 03/08/2016 Document Reviewed: 07/28/2015 Elsevier Interactive Patient Education  [employer].

## 2018-04-28 NOTE — Progress Notes (Signed)
Subjective:    Patient ID: Bill Hinton, male    DOB: 1932-06-21, 84 y.o.   MRN: [id number]  HPI Pt is a 84 yr old male presenting to the clinic with a rash to the left lateral foot and lateral mallelous. The rash has been present for 4 days. He describes the pain as burning and stinging. Denies itchiness, drainage, trauma, or spreading of the rash. They have tried applying ice to the site with no relief. He denies sick contacts. Never has a rash like this before. Denies recent exposure to outside water. Pt denies getting shingles vaccine.pt does have some chronic pain over lateral ankle due to arthritis.it has been worked up before.   Pt has a hx of COPD and is on chronic Prednisone.   04/08/18: Pt was put on Levaquin x 7 days and received Methylprednisolone IM this date for COPD exacerbation. He is still taking his antibiotic. Denies cough, sob, chest pain or worsening COPD symptoms.  .. Active Ambulatory Problems    Diagnosis Date Noted  . Hyperlipidemia 12/30/2008  . Steroid-dependent chronic obstructive pulmonary disease ([place]) 12/30/2008  . Gastroesophageal reflux disease without esophagitis 12/30/2008  . Acute respiratory failure with hypoxia ([place]) 09/17/2016  . Atherosclerotic heart disease of native coronary artery without angina pectoris 04/11/2015  . Bell's palsy 09/17/2016  . Benign prostatic hyperplasia 12/14/2016  . Bronchiectasis ([place]) 09/17/2016  . Mounier-Kuhn bronchiectasis with acute exacerbation ([place]) 08/17/2016  . CAD (coronary artery disease), native coronary artery 12/14/2016  . Status post cataract extraction 01/21/2014  . Chronic kidney disease 04/11/2015  . Declining functional status 09/26/2016  . Depression with anxiety 12/14/2016  . Diaphragmatic hernia without obstruction or gangrene 04/11/2015  . Dysphagia 08/17/2016  . Enlarged prostate without lower urinary tract symptoms ([place]) 04/11/2015  . Essential (primary) hypertension 04/11/2015  . History of  nephrolithiasis 12/14/2016  . Nocturia 04/11/2015  . 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 Nuclear sclerosis of right eye 01/19/2014  . Other seasonal allergic rhinitis 04/11/2015  . Panic disorder without agoraphobia 04/11/2015  . Hospital-acquired pneumonia 09/19/2016  . Pneumothorax 12/14/2016  . Presence of intraocular lens 01/21/2014  . Pseudophakia of both eyes 01/21/2014  . Pterygium of left eye 05/29/2012  . Restless leg syndrome 09/19/2016  . Right ureteral calculus 04/13/2015  . 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 Tinnitus 12/14/2016  . Vasomotor rhinitis 08/17/2016  . Vitamin D deficiency 12/14/2016  . Rhinorrhea 12/14/2016  . Mass of upper lobe of right lung 04/09/2017  . Sepsis due to cellulitis ([place]) 05/02/2017  . Thrombocytopenia ([place]) 04/09/2017  . Aorto-iliac atherosclerosis ([place]) 07/19/2017  . AAA (abdominal aortic aneurysm) ([place]) 07/23/2017  . Skin cancer 10/04/2017  . Chronic pain of left knee 10/04/2017  . Chronic systolic congestive heart failure ([place]) 10/04/2017  . Elevated serum creatinine 10/04/2017   Resolved Ambulatory Problems    Diagnosis Date Noted  . CELLULITIS, [place] 01/01/2009  . CELLULITIS, HAND, RIGHT 01/01/2009   Past Medical History:  Diagnosis Date  . AAA (abdominal aortic aneurysm) ([place]) 07/23/2017  . COPD (chronic obstructive pulmonary disease) ([place])   . Reflux   . Spontaneous pneumothorax       Review of Systems  Constitutional: Negative for chills, fatigue and fever.  HENT: Negative.   Respiratory: Negative for cough, shortness of breath and wheezing.   Cardiovascular: Negative for chest pain and palpitations.  Musculoskeletal: Positive for arthralgias (Lateral malleolus). Negative for joint swelling and myalgias.  Skin: Positive for rash (Diffuse erythematous rash with well  defined borders with fine scaling around the circumference that extends up to the left lateral ankle with patches).  Neurological: Negative for  weakness and numbness.  All other systems reviewed and are negative.      Objective:   Physical Exam  Constitutional: He appears well-developed and well-nourished. No distress.  HENT:  Head: Normocephalic and atraumatic.  Eyes: Pupils are equal, round, and reactive to light. Conjunctivae and EOM are normal.  Neck: Normal range of motion.  Cardiovascular: Normal rate, regular rhythm and normal heart sounds.  Pulmonary/Chest: Effort normal and breath sounds normal.  Musculoskeletal: He exhibits tenderness (lateral malleolus due to chronic OA. No tenderness to rash on the lateral foot area. ). He exhibits no edema or deformity.  Neurological: He is alert.  Skin:  Diffuse erythematous rash over left lateral ankle and foot with raised well defined borders and appearance of scaling around the circumference.  Psychiatric: He has a normal mood and affect. His behavior is normal.      Assessment & Plan:  Marland KitchenMarland KitchenSaiquan was seen today for rash.  Diagnoses and all orders for this visit:  Rash -     clotrimazole-betamethasone (LOTRISONE) cream; Apply 1 application topically 2 (two) times daily. For rash on foot. -     fluconazole (DIFLUCAN) 150 MG tablet; Take 1 tablet (150 mg total) by mouth once for 1 dose.  Chronic pain of right ankle -     diclofenac sodium (VOLTAREN) 1 % GEL; Apply 4 g topically 4 (four) times daily. For arthritic pain.   -Rash: Tinea Corporis  vs Shingles. Rash was non-vesicular and was not painful to palpation. Tenderness over the lateral malleolus which is chronic from OA. Rash was diffusely spread erythematous patchy rash  rather than being grouped and vesicular in nature consistent with shingles. Pt has been wearing long socks over the area predisposing him to a possible fungal infection. He has been on a lot of prednisone which could predispose him to either reaction.  -Votaren gel prescribed for OA pain. Lotrisone cream and Diflucan for fungal infection.   -Pt was  told to avoid wearing socks at the house to increase air flow to the area.  -Pt was told to return if worsening rash, vesicular formation, or if rash has not resolved. Pt voiced understanding.   Marland KitchenVernetta Honey PA-C, have reviewed and agree with the above documentation in it's entirety.

## 2018-05-11 ENCOUNTER — Other Ambulatory Visit: Payer: Self-pay | Admitting: Osteopathic Medicine

## 2018-05-11 DIAGNOSIS — M7989 Other specified soft tissue disorders: Secondary | ICD-10-CM

## 2018-05-16 DIAGNOSIS — J449 Chronic obstructive pulmonary disease, unspecified: Secondary | ICD-10-CM | POA: Diagnosis not present

## 2018-05-16 DIAGNOSIS — R091 Pleurisy: Secondary | ICD-10-CM | POA: Diagnosis not present

## 2018-05-16 DIAGNOSIS — R0902 Hypoxemia: Secondary | ICD-10-CM | POA: Diagnosis not present

## 2018-05-16 DIAGNOSIS — J471 Bronchiectasis with (acute) exacerbation: Secondary | ICD-10-CM | POA: Diagnosis not present

## 2018-06-05 ENCOUNTER — Other Ambulatory Visit: Payer: Self-pay

## 2018-06-05 ENCOUNTER — Encounter: Payer: Self-pay | Admitting: Neurology

## 2018-06-05 ENCOUNTER — Ambulatory Visit (INDEPENDENT_AMBULATORY_CARE_PROVIDER_SITE_OTHER): Payer: Medicare Other | Admitting: Neurology

## 2018-06-05 VITALS — BP 142/60 | HR 98 | Ht 70.0 in | Wt 146.0 lb

## 2018-06-05 DIAGNOSIS — G2581 Restless legs syndrome: Secondary | ICD-10-CM

## 2018-06-05 DIAGNOSIS — F0391 Unspecified dementia with behavioral disturbance: Secondary | ICD-10-CM

## 2018-06-05 DIAGNOSIS — R442 Other hallucinations: Secondary | ICD-10-CM

## 2018-06-05 DIAGNOSIS — F03B18 Unspecified dementia, moderate, with other behavioral disturbance: Secondary | ICD-10-CM

## 2018-06-05 MED ORDER — QUETIAPINE FUMARATE 50 MG PO TABS: 50.0000 mg | ORAL_TABLET | Freq: Every day | ORAL | 6 refills | Status: DC

## 2018-06-05 MED ORDER — DONEPEZIL HCL 10 MG PO TABS: 10.0000 mg | ORAL_TABLET | Freq: Every day | ORAL | 3 refills | Status: DC

## 2018-06-05 MED ORDER — CLONAZEPAM 0.5 MG PO TABS: ORAL_TABLET | ORAL | 5 refills | Status: DC

## 2018-06-05 NOTE — Progress Notes (Signed)
NEUROLOGY FOLLOW UP OFFICE NOTE  Bill Hinton [id number] 30-Aug-1932  HISTORY OF PRESENT ILLNESS: I had the pleasure of seeing Bill Hinton in follow-up in the neurology clinic on 06/09/2018.  The patient was last seen by one of my partners, Dr. Posey Pronto 6 months ago for dementia with behavioral disturbance. He is again accompanied by his wife and daughter who help supplement the history  today. MMSE 20/30 in September 2018. He is on Aricept 10mg  daily. Daughter manages his medications. He does not drive. Wife manages finances. On his last visit, his family was happy to report significant improvement in daytime drowsiness with reduction in Seroquel to 50mg  qhs. Requip was also stopped, and RLS was initially controlled on clonazepam 0.125mg  (1/4 tablet) qhs. He was more alert at that time, gait was better. He does have neuropathy. Family was reporting tactile hallucinations, feeling as if worms are rolling out of his body and Seroquel was adjusted to 25mg  BID. He did well until around a month ago when he was having more RLS symptoms and unable to sleep at night. Family increased clonazepam to 1/2 tablet (0.5mg ). They report that even before the increase, he was sleeping a lot again during the daytime. The BID dosing of Seroquel did not help with the picking behavior, he continues to report the sensation of crawling on his skin. He needs help with dressing, needs help with buttons. Family helps with bathing. He is drowsy in the office today, but would wake up when stimulated and examined. He denies any headaches, dizziness, vision changes, focal numbness/tingling/weakness, no falls. He ambulates with a walker.   History on Initial Assessment 07/12/2017: This is a pleasant 84 year old ambidextrous right-hand dominant man with a history of COPD, CHF, and dementia, presenting to establish care. He reports his memory is not too good. He forgets things he used to know such as locations, distances. He stopped  driving 8 months ago due to vision problems, denied getting lost driving. His wife took over bill payments a year ago due to his vision issues. She administers his medications. He states he can see an image but cannot see well. His wife and daughter report that memory has been progressively worsening over the past 2 years. They report his long-term memory is "excellent," but short-term memory is a problem. He forgets more, needing help in the shower for the past 2-3 months. He can dress himself but his wife usually assists him. He can follow instructions per wife. He has not been sleeping well, usually waking up after 3-4 hours and putting his clothes on to sleep in the recliner. Family denies any wandering. He had been seeing neurologist Dr. Wynelle Link, per wife he had an MRI brain but they are not aware of the results. He has been taking Donepezil, Citalopram, and Seroquel 100mg  qhs. His wife reports that he has been having visual hallucinations, sometimes reporting seeing a dog or a child by his recliner. Over the past 2 weeks, he thinks there is a worm under his skin and has tried to pull it out from his hand. He was on a higher dose of Seroquel which cause daytime drowsiness. On the 100mg  dose, he still wakes up at night after 3-4 hours of sleep. He is drowsy in the office today, family reports this is his usual nap time. Family reports right facial weakness 3 years ago, he was brought to [facility] where he was diagnosed with Bell's palsy, however they report that since then, everything went  downhill. He was apparently in good shape until the Bell's palsy and has had vision changes and speech changes afterwards. He had speech therapy recently for dysphagia, which has helped a lot. Family denies any paranoia, he is more irritable and snaps more easily. He has spells where he hollers for his mother when he is asleep, this does not occur in the daytime.   He has a history of restless leg syndrome, his wife  reports that Requip 1mg  qhs has helped a lot. He states that his legs still jump, as well as his arms. He is noted to have myoclonic jerks in the office today in either leg/arm, and trunk. Body jerks have been going on for at least 2 years. He has occasional dizziness. He has a lot of back pain and aching on his left leg. He reports a history of left ankle injury that has affected his left leg. He has mild neck pain. He denies any headaches, focal numbness/tingling, bowel/bladder dysfunction. He has noticed a decrease in sense of smell. He has occasional hand tremors. There is no family history of dementia. He denies any history of significant head injury, no alcohol use.   PAST MEDICAL HISTORY: Past Medical History:  Diagnosis Date  . AAA (abdominal aortic aneurysm) ([place]) 07/23/2017   US done 07/22/17: 2.7 x 3.2 cm  Recommend followup by ultrasound in 3 years  . COPD (chronic obstructive pulmonary disease) ([place])   . Reflux   . Spontaneous pneumothorax     MEDICATIONS: Current Outpatient Medications on File Prior to Visit  Medication Sig Dispense Refill  . acetaminophen (TYLENOL) 500 MG tablet Take 1-2 tablets (500-1,000 mg total) every 6 (six) hours as needed by mouth. 90 tablet 1  . aspirin 81 MG chewable tablet Chew 1 tablet (81 mg total) by mouth daily. 90 tablet 3  . Cholecalciferol (VITAMIN D-3) 1000 units CAPS Take 1 capsule by mouth daily.    . citalopram (CELEXA) 40 MG tablet Take 1 tablet (40 mg total) by mouth daily. Pt is due for a f/u appt w/PCP 90 tablet 0  . clonazePAM (KLONOPIN) 0.5 MG tablet Take 1/2 tablet 30 minutes before bedtime 15 tablet 5  . clotrimazole-betamethasone (LOTRISONE) cream Apply 1 application topically 2 (two) times daily. For rash on foot. 45 g 1  . diclofenac sodium (VOLTAREN) 1 % GEL Apply 4 g topically 4 (four) times daily. For arthritic pain. 1 Tube 1  . donepezil (ARICEPT) 10 MG tablet Take 1 tablet (10 mg total) by mouth daily. 90 tablet 3  .  furosemide (LASIX) 20 MG tablet Take 1 tablet (20 mg total) by mouth daily. Can take 2 tablets (40 mg) If increased swelling or weight gain 90 tablet 1  . furosemide (LASIX) 20 MG tablet TAKE 1 TABLET BY MOUTH DAILY 90 tablet 0  . ipratropium-albuterol (DUONEB) 0.5-2.5 (3) MG/3ML SOLN Inhale into the lungs.    . lansoprazole (PREVACID) 30 MG capsule Take 1 capsule (30 mg total) by mouth daily at 12 noon. 90 capsule 3  . montelukast (SINGULAIR) 10 MG tablet Take 10 mg by mouth daily.  11  . mupirocin ointment (BACTROBAN) 2 % Apply twice daily with dressing change for one week. 22 g 0  . potassium chloride (KLOR-CON) 20 MEQ packet Take 20 mEq by mouth 2 (two) times daily. 100 packet 3  . predniSONE (DELTASONE) 10 MG tablet Take 3 by mouth (30 mg) with breakfast daily for 1 week until you see your lung dr. (Patient  taking differently: Take 10 mg by mouth. Take 1 by mouth (10 mg) with breakfast daily for 1 week until you see your lung dr.) 21 tablet 0  . QUEtiapine (SEROQUEL) 50 MG tablet Take 1 tablet (50 mg total) by mouth at bedtime. 30 tablet 6  . rosuvastatin (CRESTOR) 5 MG tablet TAKE 1 TABLET(5 MG) BY MOUTH AT BEDTIME 90 tablet 0   No current facility-administered medications on file prior to visit.     ALLERGIES: Allergies  Allergen Reactions  . Cefepime Other (See Comments)    Myoclonus Myoclonus Myoclonus  . Fluticasone-Salmeterol Anaphylaxis and Other (See Comments)    Other reaction(s): Other (See Comments) Blisters in mouth Blisters in mouth Uses DuoNeb at home without reactions  Other reaction(s): Other (See Comments) Blisters in mouth Blisters in mouth Uses DuoNeb at home without reactions Other reaction(s): Other (See Comments) Other reaction(s): Other (See Comments) Blisters in mouth Blisters in mouth Blisters in mouth Blisters in mouth  Other reaction(s): Other (See Comments) Blisters in mouth Blisters in mouth Uses DuoNeb at home without reactions Other  reaction(s): Other (See Comments) Other reaction(s): Other (See Comments) Blisters in mouth Blisters in mouth Blisters in mouth  . Hydromorphone Hcl Other (See Comments)  . Hydromorphone Hcl   . Theophylline     Other reaction(s): Other (See Comments) Throat irritation    FAMILY HISTORY: Family History  Problem Relation Age of Onset  . Heart attack Mother   . Heart attack Father     SOCIAL HISTORY: Social History   Socioeconomic History  . Marital status: Married    Spouse name: Not on file  . Number of children: Not on file  . Years of education: Not on file  . Highest education level: Not on file  Occupational History  . Not on file  Social Needs  . Financial resource strain: Not on file  . Food insecurity:    Worry: Not on file    Inability: Not on file  . Transportation needs:    Medical: Not on file    Non-medical: Not on file  Tobacco Use  . Smoking status: Former Smoker    Last attempt to quit: 10/15/1998    Years since quitting: 19.6  . Smokeless tobacco: Never Used  Substance and Sexual Activity  . Alcohol use: No  . Drug use: No  . Sexual activity: Not on file  Lifestyle  . Physical activity:    Days per week: Not on file    Minutes per session: Not on file  . Stress: Not on file  Relationships  . Social connections:    Talks on phone: Not on file    Gets together: Not on file    Attends religious service: Not on file    Active member of club or organization: Not on file    Attends meetings of clubs or organizations: Not on file    Relationship status: Not on file  . Intimate partner violence:    Fear of current or ex partner: Not on file    Emotionally abused: Not on file    Physically abused: Not on file    Forced sexual activity: Not on file  Other Topics Concern  . 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[place])    [place]:  Constitutional: No fevers, chills, or sweats, no generalized fatigue, change in appetite Eyes: No visual changes, double vision, eye pain Ear, nose and throat: No hearing loss, ear pain, nasal congestion, sore throat Cardiovascular: No chest pain, palpitations Respiratory:  No shortness of breath at rest or with exertion, wheezes GastrointestinaI: No nausea, vomiting, diarrhea, abdominal pain, fecal incontinence Genitourinary:  No dysuria, urinary retention or frequency Musculoskeletal:  No neck pain, back pain Integumentary: No rash, pruritus, skin lesions Neurological: as above Psychiatric: No depression, insomnia, anxiety Endocrine: No palpitations, fatigue, diaphoresis, mood swings, change in appetite, change in weight, increased thirst Hematologic/Lymphatic:  No anemia, purpura, petechiae. Allergic/Immunologic: no itchy/runny eyes, nasal congestion, recent allergic reactions, rashes  PHYSICAL EXAM: Vitals:   06/05/18 1503  BP: (!) 142/60  Pulse: 98  SpO2: 92%   General: No acute distress, drowsy but arousable and would stay awake during the exam Head:  Normocephalic/atraumatic Neck: supple, no paraspinal tenderness, full range of motion Heart:  Regular rate and rhythm Lungs:  Clear to auscultation bilaterally Back: No paraspinal tenderness Skin/Extremities: No rash, no edema Neurological Exam: alert and oriented to person, place, and day of week,/year/season. No aphasia or dysarthria. Fund of knowledge is appropriate.  Recent and remote memory are impaired.  Attention and concentration are normal.    Able to name objects and repeat phrases.  MMSE - Mini Mental State Exam 06/05/2018 07/12/2017  Orientation to time 3 2  Orientation to Place 5 5  Registration 3 3  Attention/ Calculation 2 1  Recall 1 1  Language- name 2 objects 2 2  Language- repeat 1 1  Language- follow 3 step command 3 3  Language- read & follow direction 0 (vision  impaired) 1  Write a sentence 0(vision impaired) 1  Copy design 0(vision impaired) 0  Total score 20/27 20   Cranial nerves: Pupils equal, round, reactive to light. Patient had significant vision loss and cannot see words or write. Extraocular movements intact with no nystagmus. Visual fields full. Facial sensation intact. No facial asymmetry. Tongue, uvula, palate midline.  Motor: Bulk and tone normal, muscle strength 5/5 throughout with no pronator drift.  Sensation to light touch intact.  No extinction to double simultaneous stimulation.  Deep tendon reflexes +1 throughout, toes downgoing.  Finger to nose testing intact.  Gait slow and cautious with walker, no ataxia  IMPRESSION: This is an 84 yo ambidextrous right-hand dominant man with a history of COPD, CHF, RLS, right Bell's palsy, and mild to moderate dementia with behavioral disturbance. MMSE today 20/27 (vision impaired). Continue Donepezil 10mg  daily. Main concern today is again the significant drowsiness reported by family, as well as the picking behavior from tactile hallucinations. He is noted to be awake and alert during exam/stimulation. We discussed looking into day programs. We discussed adjusting medications, take the Seroquel 50mg  all at bedtime again. He is also on clonazepam 0.5mg  qhs for RLS, but has shown worsening symptoms on lower dose. We discussed how fluoxetine may help better with compulsive behavior, we still start weaning off citalopram, then start fluoxetine 10mg  daily once off citalopram. Side effects discussed, we may uptitrate as tolerated, his daughter was instructed to call our office next month for an update. Follow-up in 3-4 months, they know to call for any changes.   Thank you for allowing me to participate in his care.  Please do not hesitate to call for any questions or concerns.  The duration of this appointment visit was 30 minutes of face-to-face time with  the patient.  Greater than 50% of this time was  spent in counseling, explanation of diagnosis, planning of further management, and coordination of care.   Ellouise Newer, M.D.   CC: Dr. Sheppard Coil

## 2018-06-05 NOTE — Patient Instructions (Signed)
1. Take the Seroquel all at bedtime, 50mg  every night  2. After a week of switching the Seroquel schedule, start reducing the citalopram 40mg : Take 1/2 tablet daily for a week, then 1/2 tablet every other day for a week, then stop  3. After you stop the citalopram, start the Fluoxetine (Prozac) 10mg : take 1 tablet every night  4. Call our office a week after starting the Fluoxetine (around Sept 23) for an update, we can plan to further increase Fluoxetine at that time  5. Recommend looking into day programs Community Programs:  1. [place]: [phone]   Adult center for Enrichment;  Call Senior Line; [phone]  Adult day services include Adult Day Care, Adult Day Healthcare, Group Respite, Care Partners, Volunteer In [employer], Education and Support Program    2. [facility]: [phone]  6. Follow-up in 3-4 months, call for any changes.  FALL PRECAUTIONS: Be cautious when walking. Scan the area for obstacles that may increase the risk of trips and falls. When getting up in the mornings, sit up at the edge of the bed for a few minutes before getting out of bed. Consider elevating the bed at the head end to avoid drop of blood pressure when getting up. Walk always in a well-lit room (use night lights in the walls). Avoid area rugs or power cords from appliances in the middle of the walkways. Use a walker or a cane if necessary and consider physical therapy for balance exercise. Get your eyesight checked regularly.  HOME SAFETY: Consider the safety of the kitchen when operating appliances like stoves, microwave oven, and blender. Consider having supervision and share cooking responsibilities until no longer able to participate in those. Accidents with firearms and other hazards in the house should be identified and addressed as well.  ABILITY TO BE LEFT ALONE: If patient is unable to contact 911 operator, consider using LifeLine, or when the need is there, arrange for someone  to stay with patients. Smoking is a fire hazard, consider supervision or cessation. Risk of wandering should be assessed by caregiver and if detected at any point, supervision and safe proof recommendations should be instituted.  RECOMMENDATIONS FOR ALL PATIENTS WITH MEMORY PROBLEMS: 1. Continue to exercise (Recommend 30 minutes of walking everyday, or 3 hours every week) 2. Increase social interactions - continue going to [place] and enjoy social gatherings with friends and family 3. Eat healthy, avoid fried foods and eat more fruits and vegetables 4. Maintain adequate blood pressure, blood sugar, and blood cholesterol level. Reducing the risk of stroke and cardiovascular disease also helps promoting better memory. 5. Avoid stressful situations. Live a simple life and avoid aggravations. Organize your time and prepare for the next day in anticipation. 6. Sleep well, avoid any interruptions of sleep and avoid any distractions in the bedroom that may interfere with adequate sleep quality 7. Avoid sugar, avoid sweets as there is a strong link between excessive sugar intake, diabetes, and cognitive impairment The Mediterranean diet has been shown to help patients reduce the risk of progressive memory disorders and reduces cardiovascular risk. This includes eating fish, eat fruits and green leafy vegetables, nuts like almonds and hazelnuts, walnuts, and also use olive oil. Avoid fast foods and fried foods as much as possible. Avoid sweets and sugar as sugar use has been linked to worsening of memory function.  There is always a concern of gradual progression of memory problems. If this is the case, then we may need to  adjust level of care according to patient needs. Support, both to the patient and caregiver, should then be put into place.

## 2018-06-09 ENCOUNTER — Encounter: Payer: Self-pay | Admitting: Neurology

## 2018-06-13 ENCOUNTER — Ambulatory Visit: Payer: Medicare Other | Admitting: Neurology

## 2018-06-17 DIAGNOSIS — R5381 Other malaise: Secondary | ICD-10-CM | POA: Diagnosis not present

## 2018-06-17 DIAGNOSIS — S41109A Unspecified open wound of unspecified upper arm, initial encounter: Secondary | ICD-10-CM | POA: Diagnosis not present

## 2018-06-17 DIAGNOSIS — Y998 Other external cause status: Secondary | ICD-10-CM | POA: Diagnosis not present

## 2018-06-17 DIAGNOSIS — S199XXA Unspecified injury of neck, initial encounter: Secondary | ICD-10-CM | POA: Diagnosis not present

## 2018-06-17 DIAGNOSIS — Y92410 Unspecified street and highway as the place of occurrence of the external cause: Secondary | ICD-10-CM | POA: Diagnosis not present

## 2018-06-17 DIAGNOSIS — S0990XA Unspecified injury of head, initial encounter: Secondary | ICD-10-CM | POA: Diagnosis not present

## 2018-06-17 DIAGNOSIS — S61214A Laceration without foreign body of right ring finger without damage to nail, initial encounter: Secondary | ICD-10-CM | POA: Diagnosis not present

## 2018-06-17 DIAGNOSIS — R0789 Other chest pain: Secondary | ICD-10-CM | POA: Diagnosis not present

## 2018-06-17 DIAGNOSIS — R079 Chest pain, unspecified: Secondary | ICD-10-CM | POA: Diagnosis not present

## 2018-06-17 DIAGNOSIS — S51012A Laceration without foreign body of left elbow, initial encounter: Secondary | ICD-10-CM | POA: Diagnosis not present

## 2018-06-17 DIAGNOSIS — S299XXA Unspecified injury of thorax, initial encounter: Secondary | ICD-10-CM | POA: Diagnosis not present

## 2018-06-19 ENCOUNTER — Telehealth: Payer: Self-pay | Admitting: Neurology

## 2018-06-19 MED ORDER — FLUOXETINE HCL 10 MG PO CAPS: 10.0000 mg | ORAL_CAPSULE | Freq: Every day | ORAL | 3 refills | Status: DC

## 2018-06-19 NOTE — Telephone Encounter (Signed)
Patient's daughter Katharine Look called and is needing his Prozac medication called into [facility] on [address] in [place]. He has been weaning off his other medication and is to start the new on Saturday and it has not been called in. Please Call. Thanks

## 2018-06-19 NOTE — Telephone Encounter (Signed)
Rx sent.  Prozac 10mg  #90 with 3 refills.

## 2018-06-20 ENCOUNTER — Ambulatory Visit (INDEPENDENT_AMBULATORY_CARE_PROVIDER_SITE_OTHER): Payer: Medicare Other | Admitting: Family Medicine

## 2018-06-20 ENCOUNTER — Encounter: Payer: Self-pay | Admitting: Family Medicine

## 2018-06-20 VITALS — BP 110/62 | HR 75 | Temp 98.0°F | Wt 148.0 lb

## 2018-06-20 DIAGNOSIS — T148XXA Other injury of unspecified body region, initial encounter: Secondary | ICD-10-CM | POA: Diagnosis not present

## 2018-06-20 DIAGNOSIS — S0083XA Contusion of other part of head, initial encounter: Secondary | ICD-10-CM
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
## 2018-06-20 NOTE — Patient Instructions (Signed)
Thank you for coming in today. Check back in about 1 week with Dr Sheppard Coil or myself.  Keep the wounds covered with ointment.  Use the incentive spirometer.  Recheck sooner if needed.   Deep Skin Avulsion A deep skin avulsion is a type of open wound. It often results from a severe injury (trauma) that tears away all layers of the skin or an entire body part. The areas of the body that are most often affected by a deep skin avulsion include the face, lips, ears, nose, and fingers. A deep skin avulsion may make structures below the skin become visible. You may be able to see muscle, bone, nerves, and blood vessels. A deep skin avulsion can also damage important structures beneath the skin. These include tendons, ligaments, nerves, or blood vessels. What are the causes? Injuries that often cause a deep skin avulsion include:  Being crushed.  Falling against a jagged surface.  Animal bites.  Gunshot wounds.  Severe burns.  Injuries that involve being dragged, such as bicycle or motorcycle accidents.  What are the signs or symptoms? Symptoms of a deep skin avulsion include:  Pain.  Numbness.  Swelling.  A misshapen body part.  Bleeding, which may be heavy.  Fluid leaking from the wound.  How is this diagnosed? This condition may be diagnosed with a medical history and physical exam. You may also have X-rays done. How is this treated? The treatment that is chosen for a deep skin avulsion depends on how large and deep the wound is and where it is located. Treatment for all types of avulsions usually starts with:  Controlling the bleeding.  Washing out the wound with a germ-free (sterile) salt-water solution.  Removing dead tissue from the wound.  A wound may be closed or left open to heal. This depends on the size and location of the wound and whether it is likely to become infected. Wounds are usually covered or closed if they expose blood vessels, nerves, bone, or  cartilage.  Wounds that are small and clean may be closed with stitches (sutures).  Wounds that cannot be closed with sutures may be covered with a piece of skin (graft) or a skin flap. Skin may be taken from on or near the wound, from another part of the body, or from a donor.  Wounds may be left open if they are hard to close or they may become infected. These wounds heal over time from the bottom up.  You may also receive medicine. This may include:  Antibiotics.  A tetanus shot.  Rabies vaccine.  Follow these instructions at home: Medicines  Take or apply over-the-counter and prescription medicines only as told by your health care provider.  If you were prescribed an antibiotic, take or apply it as told by your health care provider. Do not stop taking the antibiotic even if your condition improves.  You may get anti-itch medicine while your wound is healing. Use it only as told by your health care provider. Wound care  There are many ways to close and cover a wound. For example, a wound can be covered with sutures, skin glue, or adhesive strips. Follow instructions from your health care provider about: ? How to take care of your wound. ? When and how you should change your bandage (dressing). ? When you should remove your dressing. ? Removing whatever was used to close your wound.  Keep the dressing dry as told by your health care provider. Do not take baths,  swim, use a hot tub, or do anything that would put your wound underwater until your health care provider approves.  Clean the wound each day or as told by your health care provider. ? Wash the wound with mild soap and water. ? Rinse the wound with water to remove all soap. ? Pat the wound dry with a clean towel. Do not rub it.  Do not scratch or pick at the wound.  Check your wound every day for signs of infection. Watch for: ? Redness, swelling, or pain. ? Fluid, blood, or pus. General instructions  Raise  (elevate) the injured area above the level of your heart while you are sitting or lying down.  Keep all follow-up visits as told by your health care provider. This is important. Contact a health care provider if:  You received a tetanus shot and you have swelling, severe pain, redness, or bleeding at the injection site.  You have a fever.  Your pain is not controlled with medicine.  You have increased redness, swelling, or pain at the site of your wound.  You have fluid, blood, or pus coming from your wound.  You notice a bad smell coming from your wound or your dressing.  A wound that was closed breaks open.  You notice something coming out of the wound, such as wood or glass.  You notice a change in the color of your skin near your wound.  You develop a new rash.  You need to change the dressing frequently due to fluid, blood, or pus draining from the wound. Get help right away if:  Your pain suddenly increases and is severe.  You develop severe swelling around the wound.  You develop numbness around the wound.  You have nausea and vomiting that does not go away after 24 hours.  You feel light-headed, weak, or faint.  You develop chest pain.  You have trouble breathing.  Your wound is on your hand or foot and you cannot properly move a finger or toe.  The wound is on your hand or foot and you notice that your fingers or toes look pale or bluish.  You have a red streak going away from your wound. This information is not intended to replace advice given to you by your health care provider. Make sure you discuss any questions you have with your health care provider. Document Released: 11/27/2006 Document Revised: 03/08/2016 Document Reviewed: 10/06/2014 Elsevier Interactive Patient Education  [employer].

## 2018-06-27 ENCOUNTER — Ambulatory Visit: Payer: Medicare Other | Admitting: Osteopathic Medicine

## 2018-06-27 DIAGNOSIS — J449 Chronic obstructive pulmonary disease, unspecified: Secondary | ICD-10-CM | POA: Diagnosis not present

## 2018-06-27 DIAGNOSIS — R079 Chest pain, unspecified: Secondary | ICD-10-CM | POA: Diagnosis not present

## 2018-06-27 DIAGNOSIS — Z7952 Long term (current) use of systemic steroids: Secondary | ICD-10-CM | POA: Diagnosis not present

## 2018-06-27 DIAGNOSIS — R0902 Hypoxemia: Secondary | ICD-10-CM | POA: Diagnosis not present

## 2018-06-27 DIAGNOSIS — J471 Bronchiectasis with (acute) exacerbation: Secondary | ICD-10-CM | POA: Diagnosis not present

## 2018-06-30 ENCOUNTER — Ambulatory Visit (INDEPENDENT_AMBULATORY_CARE_PROVIDER_SITE_OTHER): Payer: Medicare Other | Admitting: Osteopathic Medicine

## 2018-06-30 ENCOUNTER — Encounter: Payer: Self-pay | Admitting: Osteopathic Medicine

## 2018-06-30 VITALS — BP 119/62 | HR 65 | Temp 97.7°F | Wt 146.1 lb

## 2018-06-30 DIAGNOSIS — T148XXA Other injury of unspecified body region, initial encounter: Secondary | ICD-10-CM
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
## 2018-07-16 ENCOUNTER — Ambulatory Visit (INDEPENDENT_AMBULATORY_CARE_PROVIDER_SITE_OTHER): Payer: Medicare Other | Admitting: Physician Assistant

## 2018-07-16 DIAGNOSIS — Z23 Encounter for immunization: Secondary | ICD-10-CM

## 2018-07-22 DIAGNOSIS — M79672 Pain in left foot: Secondary | ICD-10-CM | POA: Diagnosis not present

## 2018-07-22 DIAGNOSIS — I21A1 Myocardial infarction type 2: Secondary | ICD-10-CM | POA: Diagnosis not present

## 2018-07-22 DIAGNOSIS — K219 Gastro-esophageal reflux disease without esophagitis: Secondary | ICD-10-CM | POA: Diagnosis present

## 2018-07-22 DIAGNOSIS — Z888 Allergy status to other drugs, medicaments and biological substances status: Secondary | ICD-10-CM | POA: Diagnosis not present

## 2018-07-22 DIAGNOSIS — A419 Sepsis, unspecified organism: Secondary | ICD-10-CM | POA: Diagnosis not present

## 2018-07-22 DIAGNOSIS — J449 Chronic obstructive pulmonary disease, unspecified: Secondary | ICD-10-CM | POA: Diagnosis not present

## 2018-07-22 DIAGNOSIS — Z8701 Personal history of pneumonia (recurrent): Secondary | ICD-10-CM | POA: Diagnosis not present

## 2018-07-22 DIAGNOSIS — E785 Hyperlipidemia, unspecified: Secondary | ICD-10-CM | POA: Diagnosis present

## 2018-07-22 DIAGNOSIS — R000 Tachycardia, unspecified: Secondary | ICD-10-CM | POA: Diagnosis not present

## 2018-07-22 DIAGNOSIS — I10 Essential (primary) hypertension: Secondary | ICD-10-CM | POA: Diagnosis not present

## 2018-07-22 DIAGNOSIS — A409 Streptococcal sepsis, unspecified: Secondary | ICD-10-CM | POA: Diagnosis present

## 2018-07-22 DIAGNOSIS — M19072 Primary osteoarthritis, left ankle and foot: Secondary | ICD-10-CM | POA: Diagnosis not present

## 2018-07-22 DIAGNOSIS — J154 Pneumonia due to other streptococci: Secondary | ICD-10-CM | POA: Diagnosis present

## 2018-07-22 DIAGNOSIS — Z8619 Personal history of other infectious and parasitic diseases: Secondary | ICD-10-CM | POA: Diagnosis not present

## 2018-07-22 DIAGNOSIS — J441 Chronic obstructive pulmonary disease with (acute) exacerbation: Secondary | ICD-10-CM | POA: Diagnosis present

## 2018-07-22 DIAGNOSIS — J440 Chronic obstructive pulmonary disease with acute lower respiratory infection: Secondary | ICD-10-CM | POA: Diagnosis present

## 2018-07-22 DIAGNOSIS — I959 Hypotension, unspecified: Secondary | ICD-10-CM | POA: Diagnosis present

## 2018-07-22 DIAGNOSIS — E1122 Type 2 diabetes mellitus with diabetic chronic kidney disease: Secondary | ICD-10-CM | POA: Diagnosis present

## 2018-07-22 DIAGNOSIS — R0689 Other abnormalities of breathing: Secondary | ICD-10-CM | POA: Diagnosis not present

## 2018-07-22 DIAGNOSIS — J811 Chronic pulmonary edema: Secondary | ICD-10-CM | POA: Diagnosis not present

## 2018-07-22 DIAGNOSIS — R0602 Shortness of breath: Secondary | ICD-10-CM | POA: Diagnosis not present

## 2018-07-22 DIAGNOSIS — R6521 Severe sepsis with septic shock: Secondary | ICD-10-CM | POA: Diagnosis present

## 2018-07-22 DIAGNOSIS — R1310 Dysphagia, unspecified: Secondary | ICD-10-CM | POA: Diagnosis not present

## 2018-07-22 DIAGNOSIS — J9621 Acute and chronic respiratory failure with hypoxia: Secondary | ICD-10-CM | POA: Diagnosis present

## 2018-07-22 DIAGNOSIS — Z885 Allergy status to narcotic agent status: Secondary | ICD-10-CM | POA: Diagnosis not present

## 2018-07-22 DIAGNOSIS — J80 Acute respiratory distress syndrome: Secondary | ICD-10-CM | POA: Diagnosis not present

## 2018-07-22 DIAGNOSIS — Z452 Encounter for adjustment and management of vascular access device: Secondary | ICD-10-CM | POA: Diagnosis not present

## 2018-07-22 DIAGNOSIS — I2510 Atherosclerotic heart disease of native coronary artery without angina pectoris: Secondary | ICD-10-CM | POA: Diagnosis present

## 2018-07-22 DIAGNOSIS — Z7952 Long term (current) use of systemic steroids: Secondary | ICD-10-CM | POA: Diagnosis not present

## 2018-07-22 DIAGNOSIS — J181 Lobar pneumonia, unspecified organism: Secondary | ICD-10-CM | POA: Diagnosis not present

## 2018-07-22 DIAGNOSIS — Z87891 Personal history of nicotine dependence: Secondary | ICD-10-CM | POA: Diagnosis not present

## 2018-07-22 DIAGNOSIS — E872 Acidosis: Secondary | ICD-10-CM | POA: Diagnosis present

## 2018-07-22 DIAGNOSIS — R509 Fever, unspecified: Secondary | ICD-10-CM | POA: Diagnosis not present

## 2018-07-22 DIAGNOSIS — Z79899 Other long term (current) drug therapy: Secondary | ICD-10-CM | POA: Diagnosis not present

## 2018-07-22 DIAGNOSIS — J189 Pneumonia, unspecified organism: Secondary | ICD-10-CM | POA: Diagnosis not present

## 2018-07-22 DIAGNOSIS — J90 Pleural effusion, not elsewhere classified: Secondary | ICD-10-CM | POA: Diagnosis not present

## 2018-07-22 DIAGNOSIS — R05 Cough: Secondary | ICD-10-CM | POA: Diagnosis not present

## 2018-07-22 DIAGNOSIS — R0902 Hypoxemia: Secondary | ICD-10-CM | POA: Diagnosis not present

## 2018-07-22 DIAGNOSIS — R748 Abnormal levels of other serum enzymes: Secondary | ICD-10-CM | POA: Diagnosis present

## 2018-07-22 DIAGNOSIS — J479 Bronchiectasis, uncomplicated: Secondary | ICD-10-CM | POA: Diagnosis not present

## 2018-07-22 DIAGNOSIS — F0390 Unspecified dementia without behavioral disturbance: Secondary | ICD-10-CM | POA: Diagnosis present

## 2018-07-22 DIAGNOSIS — N182 Chronic kidney disease, stage 2 (mild): Secondary | ICD-10-CM | POA: Diagnosis present

## 2018-07-22 DIAGNOSIS — Z7951 Long term (current) use of inhaled steroids: Secondary | ICD-10-CM | POA: Diagnosis not present

## 2018-07-22 DIAGNOSIS — R918 Other nonspecific abnormal finding of lung field: Secondary | ICD-10-CM | POA: Diagnosis not present

## 2018-07-30 MED ORDER — FLUOXETINE HCL 10 MG PO CAPS
10.00 | ORAL_CAPSULE | ORAL | Status: DC
Start: 2018-07-29 — End: 2018-07-30

## 2018-07-30 MED ORDER — SODIUM CHLORIDE 0.9 % IV SOLN
10.00 | INTRAVENOUS | Status: DC
Start: ? — End: 2018-07-30

## 2018-07-30 MED ORDER — GENERIC EXTERNAL MEDICATION
10.00 | Status: DC
Start: ? — End: 2018-07-30

## 2018-07-30 MED ORDER — CLONAZEPAM 0.5 MG PO TABS
.25 | ORAL_TABLET | ORAL | Status: DC
Start: 2018-07-28 — End: 2018-07-30

## 2018-07-30 MED ORDER — IPRATROPIUM-ALBUTEROL 0.5-2.5 (3) MG/3ML IN SOLN
3.00 | RESPIRATORY_TRACT | Status: DC
Start: 2018-07-28 — End: 2018-07-30

## 2018-07-30 MED ORDER — POLYETHYLENE GLYCOL 3350 17 G PO PACK
17.00 | PACK | ORAL | Status: DC
Start: ? — End: 2018-07-30

## 2018-07-30 MED ORDER — HYDRALAZINE HCL 20 MG/ML IJ SOLN
10.00 | INTRAMUSCULAR | Status: DC
Start: ? — End: 2018-07-30

## 2018-07-30 MED ORDER — AMOXICILLIN-POT CLAVULANATE 875-125 MG PO TABS
1.00 | ORAL_TABLET | ORAL | Status: DC
Start: 2018-07-28 — End: 2018-07-30

## 2018-07-30 MED ORDER — HEPARIN SODIUM (PORCINE) 5000 UNIT/ML IJ SOLN
5000.00 | INTRAMUSCULAR | Status: DC
Start: 2018-07-28 — End: 2018-07-30

## 2018-07-30 MED ORDER — DONEPEZIL HCL 5 MG PO TABS
10.00 | ORAL_TABLET | ORAL | Status: DC
Start: 2018-07-29 — End: 2018-07-30

## 2018-07-30 MED ORDER — MONTELUKAST SODIUM 10 MG PO TABS
10.00 | ORAL_TABLET | ORAL | Status: DC
Start: 2018-07-29 — End: 2018-07-30

## 2018-07-30 MED ORDER — QUETIAPINE FUMARATE 25 MG PO TABS
25.00 | ORAL_TABLET | ORAL | Status: DC
Start: 2018-07-28 — End: 2018-07-30

## 2018-07-30 MED ORDER — GENERIC EXTERNAL MEDICATION
4.00 | Status: DC
Start: ? — End: 2018-07-30

## 2018-07-30 MED ORDER — DOCUSATE SODIUM 100 MG PO CAPS
100.00 | ORAL_CAPSULE | ORAL | Status: DC
Start: 2018-07-29 — End: 2018-07-30

## 2018-07-30 MED ORDER — CLONAZEPAM 0.125 MG PO TBDP
.13 | ORAL_TABLET | ORAL | Status: DC
Start: ? — End: 2018-07-30

## 2018-07-30 MED ORDER — ALBUTEROL SULFATE (2.5 MG/3ML) 0.083% IN NEBU
2.50 | INHALATION_SOLUTION | RESPIRATORY_TRACT | Status: DC
Start: ? — End: 2018-07-30

## 2018-07-30 MED ORDER — PREDNISONE 20 MG PO TABS
20.00 | ORAL_TABLET | ORAL | Status: DC
Start: 2018-07-29 — End: 2018-07-30

## 2018-07-30 MED ORDER — ACETAMINOPHEN 325 MG PO TABS
650.00 | ORAL_TABLET | ORAL | Status: DC
Start: ? — End: 2018-07-30

## 2018-07-30 MED ORDER — ATORVASTATIN CALCIUM 20 MG PO TABS
20.00 | ORAL_TABLET | ORAL | Status: DC
Start: 2018-07-28 — End: 2018-07-30

## 2018-07-30 MED ORDER — BUDESONIDE 0.25 MG/2ML IN SUSP
.25 | RESPIRATORY_TRACT | Status: DC
Start: 2018-07-28 — End: 2018-07-30

## 2018-07-30 MED ORDER — GUAIFENESIN 400 MG PO TABS
400.00 | ORAL_TABLET | ORAL | Status: DC
Start: 2018-07-28 — End: 2018-07-30

## 2018-07-30 MED ORDER — CHLORHEXIDINE GLUCONATE 0.12 % MT SOLN
15.00 | OROMUCOSAL | Status: DC
Start: 2018-07-28 — End: 2018-07-30

## 2018-07-30 MED ORDER — NITROGLYCERIN 0.4 MG SL SUBL
0.40 | SUBLINGUAL_TABLET | SUBLINGUAL | Status: DC
Start: ? — End: 2018-07-30

## 2018-08-06 ENCOUNTER — Ambulatory Visit (INDEPENDENT_AMBULATORY_CARE_PROVIDER_SITE_OTHER): Payer: Medicare Other | Admitting: Osteopathic Medicine

## 2018-08-06 ENCOUNTER — Encounter: Payer: Self-pay | Admitting: Osteopathic Medicine

## 2018-08-06 VITALS — BP 112/72 | HR 68 | Temp 97.6°F | Wt 137.2 lb

## 2018-08-06 DIAGNOSIS — J189 Pneumonia, unspecified organism: Secondary | ICD-10-CM

## 2018-08-06 DIAGNOSIS — J181 Lobar pneumonia, unspecified organism: Secondary | ICD-10-CM | POA: Diagnosis not present

## 2018-08-06 DIAGNOSIS — R634 Abnormal weight loss: Secondary | ICD-10-CM | POA: Diagnosis not present

## 2018-08-06 DIAGNOSIS — R5381 Other malaise: Secondary | ICD-10-CM

## 2018-08-06 NOTE — Patient Instructions (Signed)
Plan:  Increase calorie intake, increase activity as tolerated.  Plan to come back in 1 month for weight check, sooner if needed

## 2018-08-06 NOTE — Progress Notes (Signed)
HPI: Bill Hinton is a 84 y.o. male who  has a past medical history of AAA (abdominal aortic aneurysm) ([place]) (07/23/2017), COPD (chronic obstructive pulmonary disease) ([place]), Reflux, and Spontaneous pneumothorax.  he presents to [facility] today, 08/06/18,  for chief complaint of: Hospitalization follow-up, pneumonia  Recently admitted to the hospital for left lower lobe pneumonia, admitted 07/22/2018, discharged 07/28/2018, length of stay 6 days.  Ammonia complicated by sepsis and acute on chronic respiratory failure with hypoxia, bronchiectasis, Type 2 NSTEMI in setting of sepsis.  Patient has a history of steroid-dependent COPD.  Was discharged on p.o. Augmentin for 7 days.  No invasive ventilation was required.  Only follow-up noted was completion of antibiotics and weaned from 2 L nasal cannula which he has PRN at home.  He has a follow-up with pulmonology in 2 days.  Doing well out of the hospital, feeling much better though still tired.  Has lost a bit of weight since last visit here.  Complains of low appetite.  Does not like the dietary supplements.   Patient is accompanied by wife and daughter who assists with history-taking.   Past medical, surgical, social and family history reviewed:  Patient Active Problem List   Diagnosis Date Noted  . Skin cancer 10/04/2017  . Chronic pain of left knee 10/04/2017  . Chronic systolic congestive heart failure ([place]) 10/04/2017  . Elevated serum creatinine 10/04/2017  . AAA (abdominal aortic aneurysm) ([place]) 07/23/2017  . Aorto-iliac atherosclerosis ([place]) 07/19/2017  . Mass of upper lobe of right lung 04/09/2017  . Thrombocytopenia ([place]) 04/09/2017  . Benign prostatic hyperplasia 12/14/2016  . CAD (coronary artery disease), native coronary artery 12/14/2016  . Depression with anxiety 12/14/2016  . History of nephrolithiasis 12/14/2016  . Pneumothorax 12/14/2016  . Tinnitus 12/14/2016  . Vitamin D  deficiency 12/14/2016  . Rhinorrhea 12/14/2016  . Declining functional status 09/26/2016  . Restless leg syndrome 09/19/2016  . Acute respiratory failure with hypoxia ([place]) 09/17/2016  . Bell's palsy 09/17/2016  . Bronchiectasis ([place]) 09/17/2016  . Mounier-Kuhn bronchiectasis with acute exacerbation ([place]) 08/17/2016  . Dysphagia 08/17/2016  . Vasomotor rhinitis 08/17/2016  . Right ureteral calculus 04/13/2015  . Atherosclerotic heart disease of native coronary artery without angina pectoris 04/11/2015  . Chronic kidney disease 04/11/2015  . Diaphragmatic hernia without obstruction or gangrene 04/11/2015  . Enlarged prostate without lower urinary tract symptoms (luts) 04/11/2015  . Essential (primary) hypertension 04/11/2015  . Nocturia 04/11/2015  . Other seasonal allergic rhinitis 04/11/2015  . Panic disorder without agoraphobia 04/11/2015  . Status post cataract extraction 01/21/2014  . Presence of intraocular lens 01/21/2014  . Pseudophakia of both eyes 01/21/2014  . Nuclear sclerosis of right eye 01/19/2014  . 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 Pterygium of left eye 05/29/2012  . Hyperlipidemia 12/30/2008  . Steroid-dependent chronic obstructive pulmonary disease ([place]) 12/30/2008  . Gastroesophageal reflux disease without esophagitis 12/30/2008    Past Surgical History:  Procedure Laterality Date  . CHOLECYSTECTOMY    . HERNIA REPAIR      Social History   Tobacco Use  . Smoking status: Former Smoker    Last attempt to quit: 10/15/1998    Years since quitting: 19.8  . Smokeless tobacco: Never Used  Substance Use Topics  . Alcohol use: No    Family History  Problem Relation Age of Onset  . Heart attack Mother   . Heart attack Father  Current medication list and allergy/intolerance information reviewed:    Current Outpatient Medications  Medication Sig Dispense Refill  . acetaminophen (TYLENOL) 500 MG  tablet Take 1-2 tablets (500-1,000 mg total) every 6 (six) hours as needed by mouth. 90 tablet 1  . aspirin 81 MG chewable tablet Chew 1 tablet (81 mg total) by mouth daily. 90 tablet 3  . Cholecalciferol (VITAMIN D-3) 1000 units CAPS Take 1 capsule by mouth daily.    . clonazePAM (KLONOPIN) 0.5 MG tablet Take 1/2 tablet 30 minutes before bedtime 15 tablet 5  . clotrimazole-betamethasone (LOTRISONE) cream Apply 1 application topically 2 (two) times daily. For rash on foot. 45 g 1  . diclofenac sodium (VOLTAREN) 1 % GEL Apply 4 g topically 4 (four) times daily. For arthritic pain. 1 Tube 1  . donepezil (ARICEPT) 10 MG tablet Take 1 tablet (10 mg total) by mouth daily. 90 tablet 3  . FLUoxetine (PROZAC) 10 MG capsule Take 1 capsule (10 mg total) by mouth daily. 90 capsule 3  . furosemide (LASIX) 20 MG tablet Take 1 tablet (20 mg total) by mouth daily. Can take 2 tablets (40 mg) If increased swelling or weight gain 90 tablet 1  . furosemide (LASIX) 20 MG tablet TAKE 1 TABLET BY MOUTH DAILY 90 tablet 0  . ipratropium-albuterol (DUONEB) 0.5-2.5 (3) MG/3ML SOLN Inhale into the lungs.    . lansoprazole (PREVACID) 30 MG capsule Take 1 capsule (30 mg total) by mouth daily at 12 noon. 90 capsule 3  . montelukast (SINGULAIR) 10 MG tablet Take 10 mg by mouth daily.  11  . mupirocin ointment (BACTROBAN) 2 % Apply twice daily with dressing change for one week. 22 g 0  . potassium chloride (KLOR-CON) 20 MEQ packet Take 20 mEq by mouth 2 (two) times daily. 100 packet 3  . predniSONE (DELTASONE) 10 MG tablet Take 3 by mouth (30 mg) with breakfast daily for 1 week until you see your lung dr. (Patient taking differently: Take 10 mg by mouth. Take 1 by mouth (10 mg) with breakfast daily for 1 week until you see your lung dr.) 21 tablet 0  . QUEtiapine (SEROQUEL) 50 MG tablet Take 1 tablet (50 mg total) by mouth at bedtime. 30 tablet 6  . rosuvastatin (CRESTOR) 5 MG tablet TAKE 1 TABLET(5 MG) BY MOUTH AT BEDTIME 90  tablet 0   No current facility-administered medications for this visit.     Allergies  Allergen Reactions  . Fluticasone-Salmeterol Other (See Comments) and Anaphylaxis    Blisters in mouth Blisters in mouth  . Cefepime Other (See Comments)    Myoclonus  . Hydromorphone Hcl Other (See Comments)  . Theophylline Other (See Comments)    Other reaction(s): Other (See Comments) Throat irritation       Review of Systems:  Constitutional:  No  fever, no chills, No recent illness, +unintentional weight changes. +significant fatigue.   HEENT: No  headache, no vision change, no hearing change  Cardiac: No  chest pain, No  pressure, No palpitations, No  Orthopnea  Respiratory:  +chronic stable shortness of breath. No  Cough  Gastrointestinal: No  abdominal pain, No  nausea, No  vomiting  Musculoskeletal: No new myalgia/arthralgia  Neurologic: No focal weakness, No  dizziness  Psychiatric: No  concerns with depression, No  concerns with anxiety, No sleep problems, No mood problems  Exam:  BP 112/72 (BP Location: Left Arm, Patient Position: Sitting, Cuff Size: Normal)   Pulse 68   Temp 97.6  F (36.4 C) (Oral)   Wt 137 lb 3.2 oz (62.2 kg)   BMI 19.69 kg/m   Constitutional: VS see above. General Appearance: alert, well-developed, well-nourished, NAD  Eyes: Normal lids and conjunctive, non-icteric sclera  Ears, Nose, Mouth, Throat: MMM, Normal external inspection ears/nares/mouth/lips/gums.   Neck: No masses, trachea midline. No thyroid enlargement.   Respiratory: Normal respiratory effort. Coarse breath sounds all lung fields bilaterally, no wheeze, no rhonchi, no rales  Cardiovascular: S1/S2 normal, +murmur, no rub/gallop auscultated. RRR. No lower extremity edema.  Gastrointestinal: Nontender, no masses.  Musculoskeletal: Gait normal. No clubbing/cyanosis of digits.   Neurological: Normal balance/coordination. No tremor. No cranial nerve deficit on limited exam.  Motor and sensation intact and symmetric. Cerebellar reflexes intact.   Skin: warm, dry, intact. No rash/ulcer. No concerning nevi or subq nodules on limited exam.    Psychiatric: Normal judgment/insight. Normal mood and affect. Oriented x3.       ASSESSMENT/PLAN:   Pneumonia of left lower lobe due to infectious organism ([place])  Weight loss  Physical deconditioning    Patient Instructions  Plan:  Increase calorie intake, increase activity as tolerated.  Plan to come back in 1 month for weight check, sooner if needed    Visit summary with medication list and pertinent instructions was printed for patient to review. All questions at time of visit were answered - patient instructed to contact office with any additional concerns. ER/RTC precautions were reviewed with the patient.   Follow-up plan: Return in about 4 weeks (around 09/03/2018) for weight check w/ Dr Sheppard Coil - sooner if needed.  Note: Total time spent 25 minutes, greater than 50% of the visit was spent face-to-face counseling and coordinating care for the following: The primary encounter diagnosis was Pneumonia of left lower lobe due to infectious organism [facility]). Diagnoses of Weight loss and Physical deconditioning were also pertinent to this visit.Marland Kitchen  Please note: voice recognition software was used to produce this document, and typos may escape review. Please contact Dr. Sheppard Coil for any needed clarifications.

## 2018-08-08 DIAGNOSIS — J479 Bronchiectasis, uncomplicated: Secondary | ICD-10-CM | POA: Diagnosis not present

## 2018-08-08 DIAGNOSIS — J9621 Acute and chronic respiratory failure with hypoxia: Secondary | ICD-10-CM | POA: Diagnosis not present

## 2018-08-08 DIAGNOSIS — I5022 Chronic systolic (congestive) heart failure: Secondary | ICD-10-CM | POA: Diagnosis not present

## 2018-08-08 DIAGNOSIS — R0902 Hypoxemia: Secondary | ICD-10-CM | POA: Diagnosis not present

## 2018-08-08 DIAGNOSIS — J471 Bronchiectasis with (acute) exacerbation: Secondary | ICD-10-CM | POA: Diagnosis not present

## 2018-08-08 DIAGNOSIS — J181 Lobar pneumonia, unspecified organism: Secondary | ICD-10-CM | POA: Diagnosis not present

## 2018-08-16 ENCOUNTER — Other Ambulatory Visit: Payer: Self-pay | Admitting: Osteopathic Medicine

## 2018-08-16 DIAGNOSIS — M7989 Other specified soft tissue disorders: Secondary | ICD-10-CM

## 2018-08-19 MED ORDER — FUROSEMIDE 20 MG PO TABS: 20.0000 mg | ORAL_TABLET | Freq: Every day | ORAL | 0 refills | Status: DC

## 2018-08-19 NOTE — Telephone Encounter (Signed)
Unsure why this Rx was denied, routing.

## 2018-08-19 NOTE — Telephone Encounter (Signed)
Pt's daughter advised.  

## 2018-08-19 NOTE — Addendum Note (Signed)
Addended by: Alena Bills R on: 08/19/2018 02:50 PM   Modules accepted: Orders

## 2018-08-22 ENCOUNTER — Encounter: Payer: Self-pay | Admitting: Osteopathic Medicine

## 2018-08-22 ENCOUNTER — Ambulatory Visit (INDEPENDENT_AMBULATORY_CARE_PROVIDER_SITE_OTHER): Payer: Medicare Other | Admitting: Osteopathic Medicine

## 2018-08-22 VITALS — BP 126/76 | HR 92 | Temp 98.0°F | Wt 137.0 lb

## 2018-08-22 DIAGNOSIS — E782 Mixed hyperlipidemia: Secondary | ICD-10-CM | POA: Diagnosis not present

## 2018-08-22 DIAGNOSIS — I5022 Chronic systolic (congestive) heart failure: Secondary | ICD-10-CM

## 2018-08-22 DIAGNOSIS — F3341 Major depressive disorder, recurrent, in partial remission: Secondary | ICD-10-CM | POA: Diagnosis not present

## 2018-08-22 DIAGNOSIS — Z0000 Encounter for general adult medical examination without abnormal findings: Secondary | ICD-10-CM | POA: Diagnosis not present

## 2018-08-22 DIAGNOSIS — D692 Other nonthrombocytopenic purpura: Secondary | ICD-10-CM | POA: Diagnosis not present

## 2018-08-22 DIAGNOSIS — I130 Hypertensive heart and chronic kidney disease with heart failure and stage 1 through stage 4 chronic kidney disease, or unspecified chronic kidney disease: Secondary | ICD-10-CM | POA: Diagnosis not present

## 2018-08-22 DIAGNOSIS — M1990 Unspecified osteoarthritis, unspecified site: Secondary | ICD-10-CM | POA: Insufficient documentation

## 2018-08-22 DIAGNOSIS — R7303 Prediabetes: Secondary | ICD-10-CM | POA: Diagnosis not present

## 2018-08-22 DIAGNOSIS — E440 Moderate protein-calorie malnutrition: Secondary | ICD-10-CM

## 2018-08-22 DIAGNOSIS — F0390 Unspecified dementia without behavioral disturbance: Secondary | ICD-10-CM | POA: Diagnosis not present

## 2018-08-22 DIAGNOSIS — J449 Chronic obstructive pulmonary disease, unspecified: Secondary | ICD-10-CM

## 2018-08-22 MED ORDER — ZOSTER VAC RECOMB ADJUVANTED 50 MCG/0.5ML IM SUSR: 0.5000 mL | Freq: Once | INTRAMUSCULAR | 1 refills | Status: AC

## 2018-08-22 MED ORDER — ACETAMINOPHEN 500 MG PO TABS: 500.0000 mg | ORAL_TABLET | Freq: Four times a day (QID) | ORAL | 1 refills | Status: AC | PRN

## 2018-08-22 NOTE — Patient Instructions (Addendum)
General Preventive Care  Most recent routine screening lipids/other labs: ordered today  Tobacco, Alcohol, Recreational/Illicit Drugs: don't!  Exercise: as tolerated to reduce risk of cardiovascular disease and diabetes. Strength training will also prevent osteoporosis.   Mental health: if need for mental health care (medicines, counseling, other), or concerns about moods, please let me know!   Sexual health: if need for STD testing, or if concerns with libido/pain problems, please let me know!  Vaccines  Flu vaccine: thanks for getting your flu vaccine this year to protect yourself and others!   Shingles vaccine: Shingrix recommended - Rx printed to get this at pharmacy   Pneumonia vaccines: has completed pneumonia series  Tetanus booster: Tdap recommended every 10 years - due 2025 Cancer screenings   Colon cancer screening: typically stop at age 66  Prostate cancer screening: recommendations vary, optional PSA blood test for men around age 69-77  Lung cancer screening: CT chest every year for those sge 11 to 82 years old  Infection screenings . HIV & Gonorrhea/Chlamydia: screening as needed . Hepatitis C: recommended for anyone born 15-1963 . TB: certain at-risk populations, or depending on work requirements and/or travel history Other . Bone Density Test: recommended for women at age 86, men at age 1, sooner depending on risk factors . Advanced Directive: Living Will and/or Healthcare Power of Attorney recommended for all adults, regardless of age or health!                  Immunization History  Administered Date(s) Administered  . Influenza Split 07/11/2012, 07/27/2015, 07/17/2016  . Influenza, High Dose Seasonal PF 07/05/2017, 07/16/2018  . Influenza, Seasonal, Injecte, Preservative Fre 07/27/2015  . Influenza,inj,Quad PF,6+ Mos 07/27/2015  . Pneumococcal Conjugate-13 09/06/2015  . Pneumococcal Polysaccharide-23 03/16/2004  . Td 03/17/2007  . Tdap  11/03/2013

## 2018-08-22 NOTE — Progress Notes (Signed)
HPI: Bill Hinton is a 84 y.o. male who  has a past medical history of AAA (abdominal aortic aneurysm) ([place]) (07/23/2017), COPD (chronic obstructive pulmonary disease) ([place]), Reflux, and Spontaneous pneumothorax.  he presents to [facility] today, 08/22/18,  for chief complaint of: Wellness physical    Patient here for annual physical / wellness exam.  See preventive care reviewed as below.  Recent labs reviewed in detail with the patient.   Additional concerns today include -monitor chronic medical conditions:   Still not eating very well, weight has not continued to drop, has been stable since he has been discharged from the hospital.  He reports appetite is okay, he does not like the nutritional supplement shakes.  Concerned about skin spots, bruising.  History of prediabetes, he is doing okay avoiding excessive carbs, trying to keep diet higher in fats and proteins.  Dementia/behavioral: Doing well, Seroquel in the evenings has been helpful for mood and sleep.  He reports some depression on occasion but overall not bad.  Cardiac: History of hypertension, CHF.  Following with cardiology.  No recent chest pain or shortness of breath out of the ordinary  COPD: Recent treatment for exacerbation, feeling better.  Leg/knee pain, has been doing well with Tylenol arthritis extra strength.  Wife is on gabapentin, inquires whether this might be helpful for him.   Past medical, surgical, social and family history reviewed:  Patient Active Problem List   Diagnosis Date Noted  . Skin cancer 10/04/2017  . Chronic pain of left knee 10/04/2017  . Chronic systolic congestive heart failure ([place]) 10/04/2017  . Elevated serum creatinine 10/04/2017  . AAA (abdominal aortic aneurysm) ([place]) 07/23/2017  . Aorto-iliac atherosclerosis ([place]) 07/19/2017  . Mass of upper lobe of right lung 04/09/2017  . Thrombocytopenia ([place]) 04/09/2017  . Benign prostatic  hyperplasia 12/14/2016  . CAD (coronary artery disease), native coronary artery 12/14/2016  . Depression with anxiety 12/14/2016  . History of nephrolithiasis 12/14/2016  . Pneumothorax 12/14/2016  . Tinnitus 12/14/2016  . Vitamin D deficiency 12/14/2016  . Rhinorrhea 12/14/2016  . Declining functional status 09/26/2016  . Restless leg syndrome 09/19/2016  . Acute respiratory failure with hypoxia ([place]) 09/17/2016  . Bell's palsy 09/17/2016  . Bronchiectasis ([place]) 09/17/2016  . Mounier-Kuhn bronchiectasis with acute exacerbation ([place]) 08/17/2016  . Dysphagia 08/17/2016  . Vasomotor rhinitis 08/17/2016  . Right ureteral calculus 04/13/2015  . Atherosclerotic heart disease of native coronary artery without angina pectoris 04/11/2015  . Chronic kidney disease 04/11/2015  . Diaphragmatic hernia without obstruction or gangrene 04/11/2015  . Enlarged prostate without lower urinary tract symptoms (luts) 04/11/2015  . Essential (primary) hypertension 04/11/2015  . Nocturia 04/11/2015  . Other seasonal allergic rhinitis 04/11/2015  . Panic disorder without agoraphobia 04/11/2015  . Status post cataract extraction 01/21/2014  . Presence of intraocular lens 01/21/2014  . Pseudophakia of both eyes 01/21/2014  . Nuclear sclerosis of right eye 01/19/2014  . 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 Pterygium of left eye 05/29/2012  . Hyperlipidemia 12/30/2008  . Steroid-dependent chronic obstructive pulmonary disease ([place]) 12/30/2008  . Gastroesophageal reflux disease without esophagitis 12/30/2008    Past Surgical History:  Procedure Laterality Date  . CHOLECYSTECTOMY    . HERNIA REPAIR      Social History   Tobacco Use  . Smoking status: Former Smoker    Last attempt to quit: 10/15/1998    Years since quitting: 19.8  . Smokeless tobacco:  Never Used  Substance Use Topics  . Alcohol use: No    Family History  Problem Relation Age of  Onset  . Heart attack Mother   . Heart attack Father      Current medication list and allergy/intolerance information reviewed:    Current Outpatient Medications  Medication Sig Dispense Refill  . acetaminophen (TYLENOL) 500 MG tablet Take 1-2 tablets (500-1,000 mg total) every 6 (six) hours as needed by mouth. 90 tablet 1  . aspirin 81 MG chewable tablet Chew 1 tablet (81 mg total) by mouth daily. 90 tablet 3  . Cholecalciferol (VITAMIN D-3) 1000 units CAPS Take 1 capsule by mouth daily.    . clonazePAM (KLONOPIN) 0.5 MG tablet Take 1/2 tablet 30 minutes before bedtime 15 tablet 5  . clotrimazole-betamethasone (LOTRISONE) cream Apply 1 application topically 2 (two) times daily. For rash on foot. 45 g 1  . diclofenac sodium (VOLTAREN) 1 % GEL Apply 4 g topically 4 (four) times daily. For arthritic pain. 1 Tube 1  . donepezil (ARICEPT) 10 MG tablet Take 1 tablet (10 mg total) by mouth daily. 90 tablet 3  . FLUoxetine (PROZAC) 10 MG capsule Take 1 capsule (10 mg total) by mouth daily. 90 capsule 3  . furosemide (LASIX) 20 MG tablet Take 1 tablet (20 mg total) by mouth daily. 90 tablet 0  . ipratropium-albuterol (DUONEB) 0.5-2.5 (3) MG/3ML SOLN Inhale into the lungs.    . lansoprazole (PREVACID) 30 MG capsule Take 1 capsule (30 mg total) by mouth daily at 12 noon. 90 capsule 3  . montelukast (SINGULAIR) 10 MG tablet Take 10 mg by mouth daily.  11  . mupirocin ointment (BACTROBAN) 2 % Apply twice daily with dressing change for one week. 22 g 0  . potassium chloride (KLOR-CON) 20 MEQ packet Take 20 mEq by mouth 2 (two) times daily. 100 packet 3  . predniSONE (DELTASONE) 10 MG tablet Take 3 by mouth (30 mg) with breakfast daily for 1 week until you see your lung dr. (Patient taking differently: Take 10 mg by mouth. Take 1 by mouth (10 mg) with breakfast daily for 1 week until you see your lung dr.) 21 tablet 0  . QUEtiapine (SEROQUEL) 50 MG tablet Take 1 tablet (50 mg total) by mouth at bedtime. 30  tablet 6  . rosuvastatin (CRESTOR) 5 MG tablet TAKE 1 TABLET(5 MG) BY MOUTH AT BEDTIME 90 tablet 0   No current facility-administered medications for this visit.     Allergies  Allergen Reactions  . Fluticasone-Salmeterol Other (See Comments) and Anaphylaxis    Blisters in mouth Blisters in mouth  . Cefepime Other (See Comments)    Myoclonus  . Hydromorphone Hcl Other (See Comments)  . Theophylline Other (See Comments)    Other reaction(s): Other (See Comments) Throat irritation       Review of Systems:  Constitutional:  No  fever, no chills, + recent illness, +unintentional weight changes. +significant fatigue.   HEENT: No  headache, no vision change,  Cardiac: No  chest pain, No  pressure, No palpitations, No  Orthopnea, +LE edema to ankles  Respiratory:  No  shortness of breath. No  Cough  Gastrointestinal: No  abdominal pain, No  nausea, No  vomiting,  No  blood in stool, No  diarrhea, No  constipation   Musculoskeletal: No new myalgia/arthralgia  Skin: No  Rash, +other wounds/concerning lesions - bruising  Genitourinary: No  incontinence, No  abnormal genital bleeding, No abnormal genital discharge  Hem/Onc: +easy bruising/bleeding, No  abnormal lymph node  Neurologic: No  weakness, No  dizziness, No  slurred speech/focal weakness/facial droop  Psychiatric: +concerns with depression, No  concerns with anxiety, +sleep problems, No mood problems  Exam:  BP 126/76   Pulse 92   Temp 98 F (36.7 C) (Oral)   Wt 137 lb (62.1 kg)   SpO2 92%   BMI 19.66 kg/m   Constitutional: VS see above. General Appearance: drowsy but certainly awake and iteractive participates in interview (baseline), well-developed, well-nourished, NAD  Eyes: Normal lids and conjunctive, non-icteric sclera  Ears, Nose, Mouth, Throat: MMM, Normal external inspection ears/nares/mouth/lips/gums.   Neck: No masses, trachea midline. No thyroid enlargement. No tenderness/mass appreciated. No  lymphadenopathy  Respiratory: Normal respiratory effort.  diminshed breath sounds bilaterally.  Coarse breath sounds in all lung fields but no rales/rhonchi, no wheezing.  Cardiovascular: S1/S2 normal, +murmur, no rub/gallop auscultated. RRR. Trace lower extremity edema.   Gastrointestinal: Nontender, no masses.  Musculoskeletal: Gait normal.   Neurological: Fair balance/coordination. +tremor. No cranial nerve deficit on limited exam. Motor and sensation intact and symmetric. Cerebellar reflexes intact.   Skin: warm, dry, intact.  Diffuse senile purpura and bruising particularly on arms.  No active ulceration or drainage  Psychiatric: Normal judgment/insight. Normal mood and affect. Oriented x3.     Wt Readings from Last 3 Encounters:  08/22/18 137 lb (62.1 kg)  08/06/18 137 lb 3.2 oz (62.2 kg)  06/30/18 146 lb 1.3 oz (66.3 kg)       ASSESSMENT/PLAN:   Annual physical exam  Moderate protein-calorie malnutrition (HCC)  Senile purpura (HCC)  Mixed hyperlipidemia  Prediabetes  Dementia without behavioral disturbance, unspecified dementia type ([place])  Recurrent major depressive disorder, in partial remission ([place])  Hypertensive heart and renal disease with congestive heart failure (HCC)  Chronic systolic heart failure (HCC)  Chronic obstructive pulmonary disease, unspecified COPD type ([place])  Osteoarthritis, unspecified osteoarthritis type, unspecified site - Plan: acetaminophen (TYLENOL) 500 MG tablet    Patient Instructions   General Preventive Care  Most recent routine screening lipids/other labs: ordered today  Tobacco, Alcohol, Recreational/Illicit Drugs: don't!  Exercise: as tolerated to reduce risk of cardiovascular disease and diabetes. Strength training will also prevent osteoporosis.   Mental health: if need for mental health care (medicines, counseling, other), or concerns about moods, please let me know!   Sexual health: if need for STD testing,  or if concerns with libido/pain problems, please let me know!  Vaccines  Flu vaccine: thanks for getting your flu vaccine this year to protect yourself and others!   Shingles vaccine: Shingrix recommended - Rx printed to get this at pharmacy   Pneumonia vaccines: has completed pneumonia series  Tetanus booster: Tdap recommended every 10 years - due 2025 Cancer screenings   Colon cancer screening: typically stop at age 38  Prostate cancer screening: recommendations vary, optional PSA blood test for men around age 22-77  Lung cancer screening: CT chest every year for those sge 67 to 82 years old  Infection screenings . HIV & Gonorrhea/Chlamydia: screening as needed . Hepatitis C: recommended for anyone born 51-1963 . TB: certain at-risk populations, or depending on work requirements and/or travel history Other . Bone Density Test: recommended for women at age 95, men at age 52, sooner depending on risk factors . Advanced Directive: Living Will and/or Healthcare Power of Attorney recommended for all adults, regardless of age or health!  Immunization History  Administered Date(s) Administered  . Influenza Split 07/11/2012, 07/27/2015, 07/17/2016  . Influenza, High Dose Seasonal PF 07/05/2017, 07/16/2018  . Influenza, Seasonal, Injecte, Preservative Fre 07/27/2015  . Influenza,inj,Quad PF,6+ Mos 07/27/2015  . Pneumococcal Conjugate-13 09/06/2015  . Pneumococcal Polysaccharide-23 03/16/2004  . Td 03/17/2007  . Tdap 11/03/2013         Visit summary with medication list and pertinent instructions was printed for patient to review. All questions at time of visit were answered - patient instructed to contact office with any additional concerns. ER/RTC precautions were reviewed with the patient.   Follow-up plan: Return in about 3 months (around 11/22/2018) for nurse visit Medicare, visit w/ Dr A same day to monitor chronic problems - see me sooner if  needed! .    Please note: voice recognition software was used to produce this document, and typos may escape review. Please contact Dr. Sheppard Coil for any needed clarifications.

## 2018-08-24 ENCOUNTER — Other Ambulatory Visit: Payer: Self-pay | Admitting: Osteopathic Medicine

## 2018-08-28 DIAGNOSIS — J471 Bronchiectasis with (acute) exacerbation: Secondary | ICD-10-CM | POA: Diagnosis not present

## 2018-08-28 DIAGNOSIS — J181 Lobar pneumonia, unspecified organism: Secondary | ICD-10-CM | POA: Diagnosis not present

## 2018-08-28 DIAGNOSIS — J309 Allergic rhinitis, unspecified: Secondary | ICD-10-CM | POA: Diagnosis not present

## 2018-09-11 IMAGING — CR DG CHEST 2V
2 series · 2 of 2 positions shown · non-contrast
Comparison: 03/15/2017

CLINICAL DATA: Cough for months

EXAM:
CHEST  2 VIEW

[w chest pa]
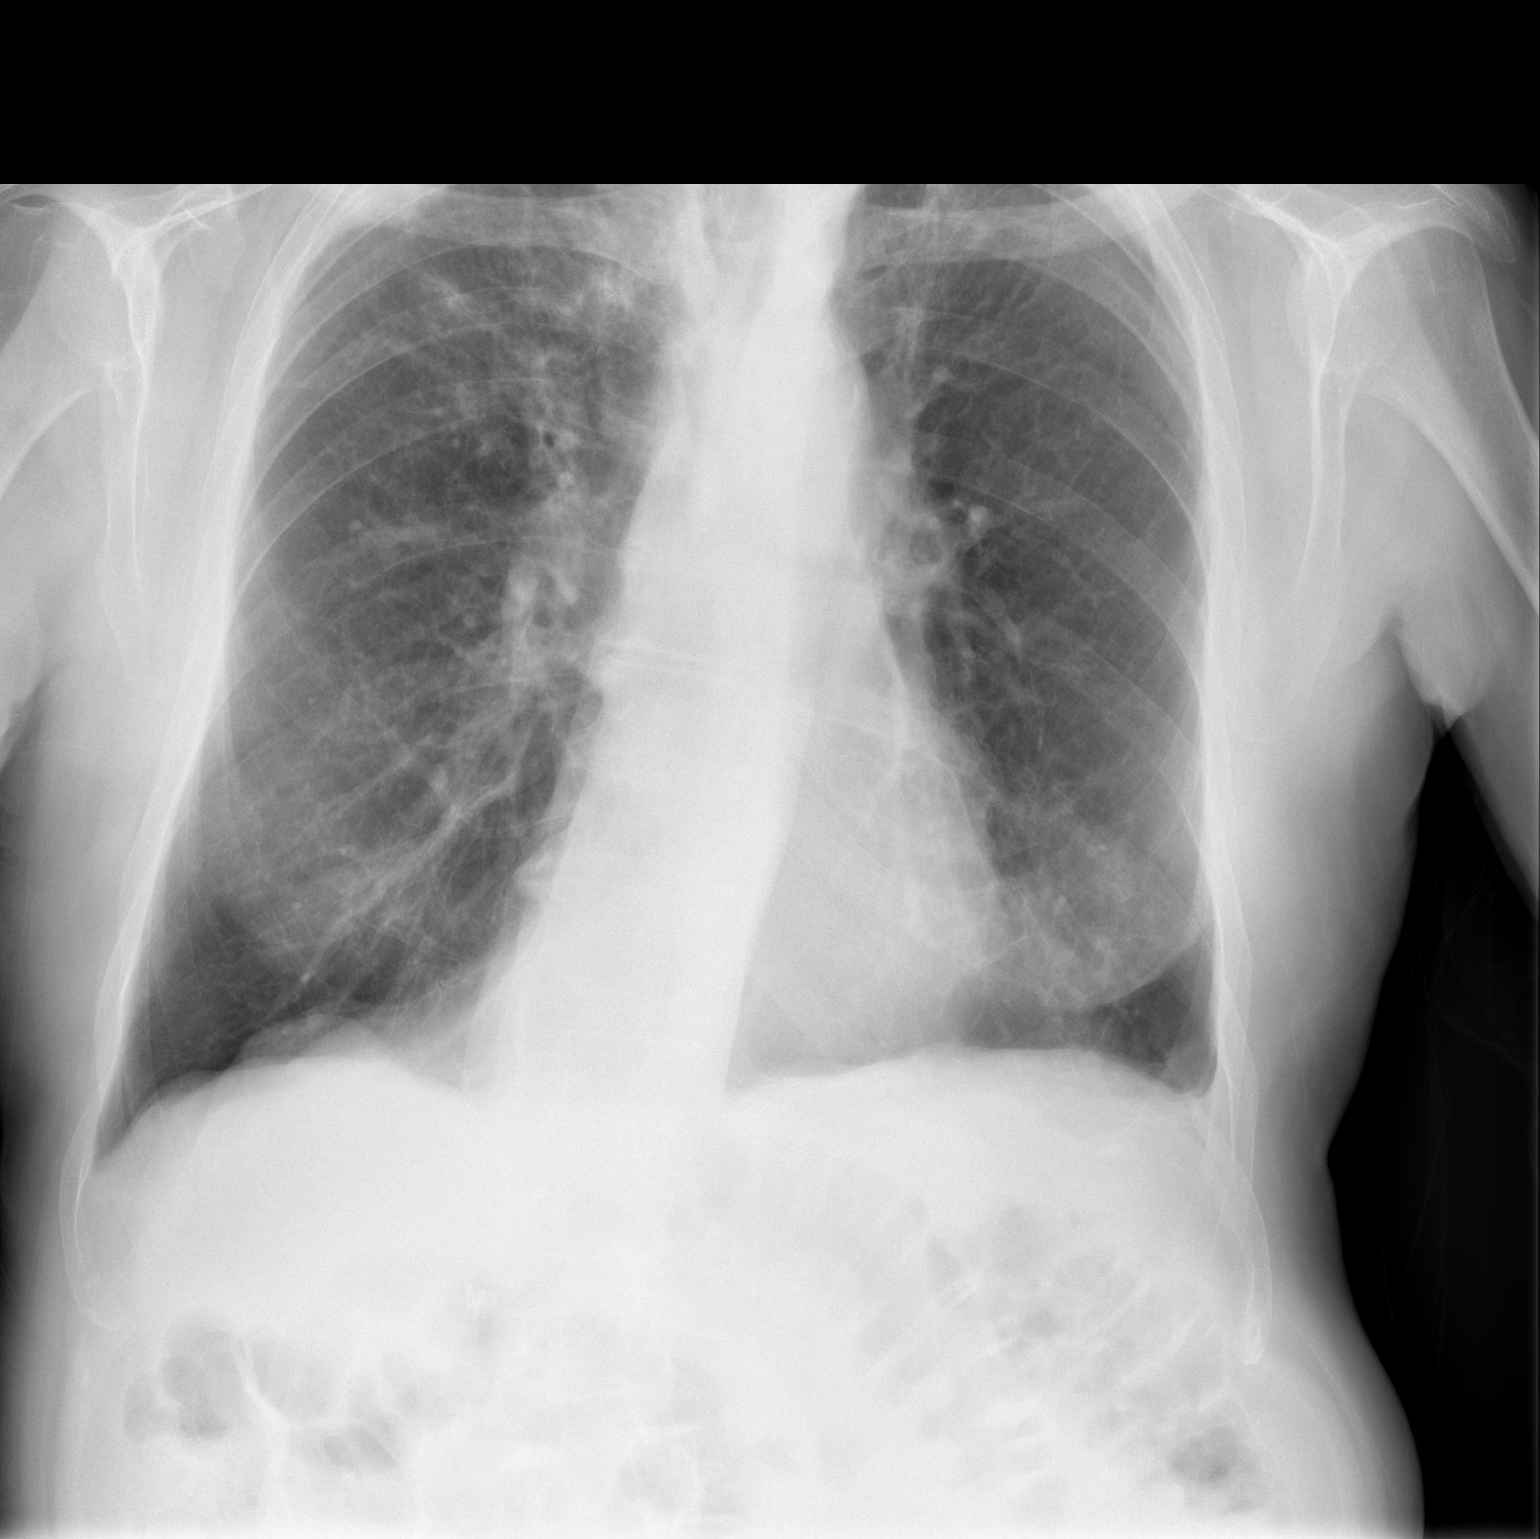

[w chest lat]
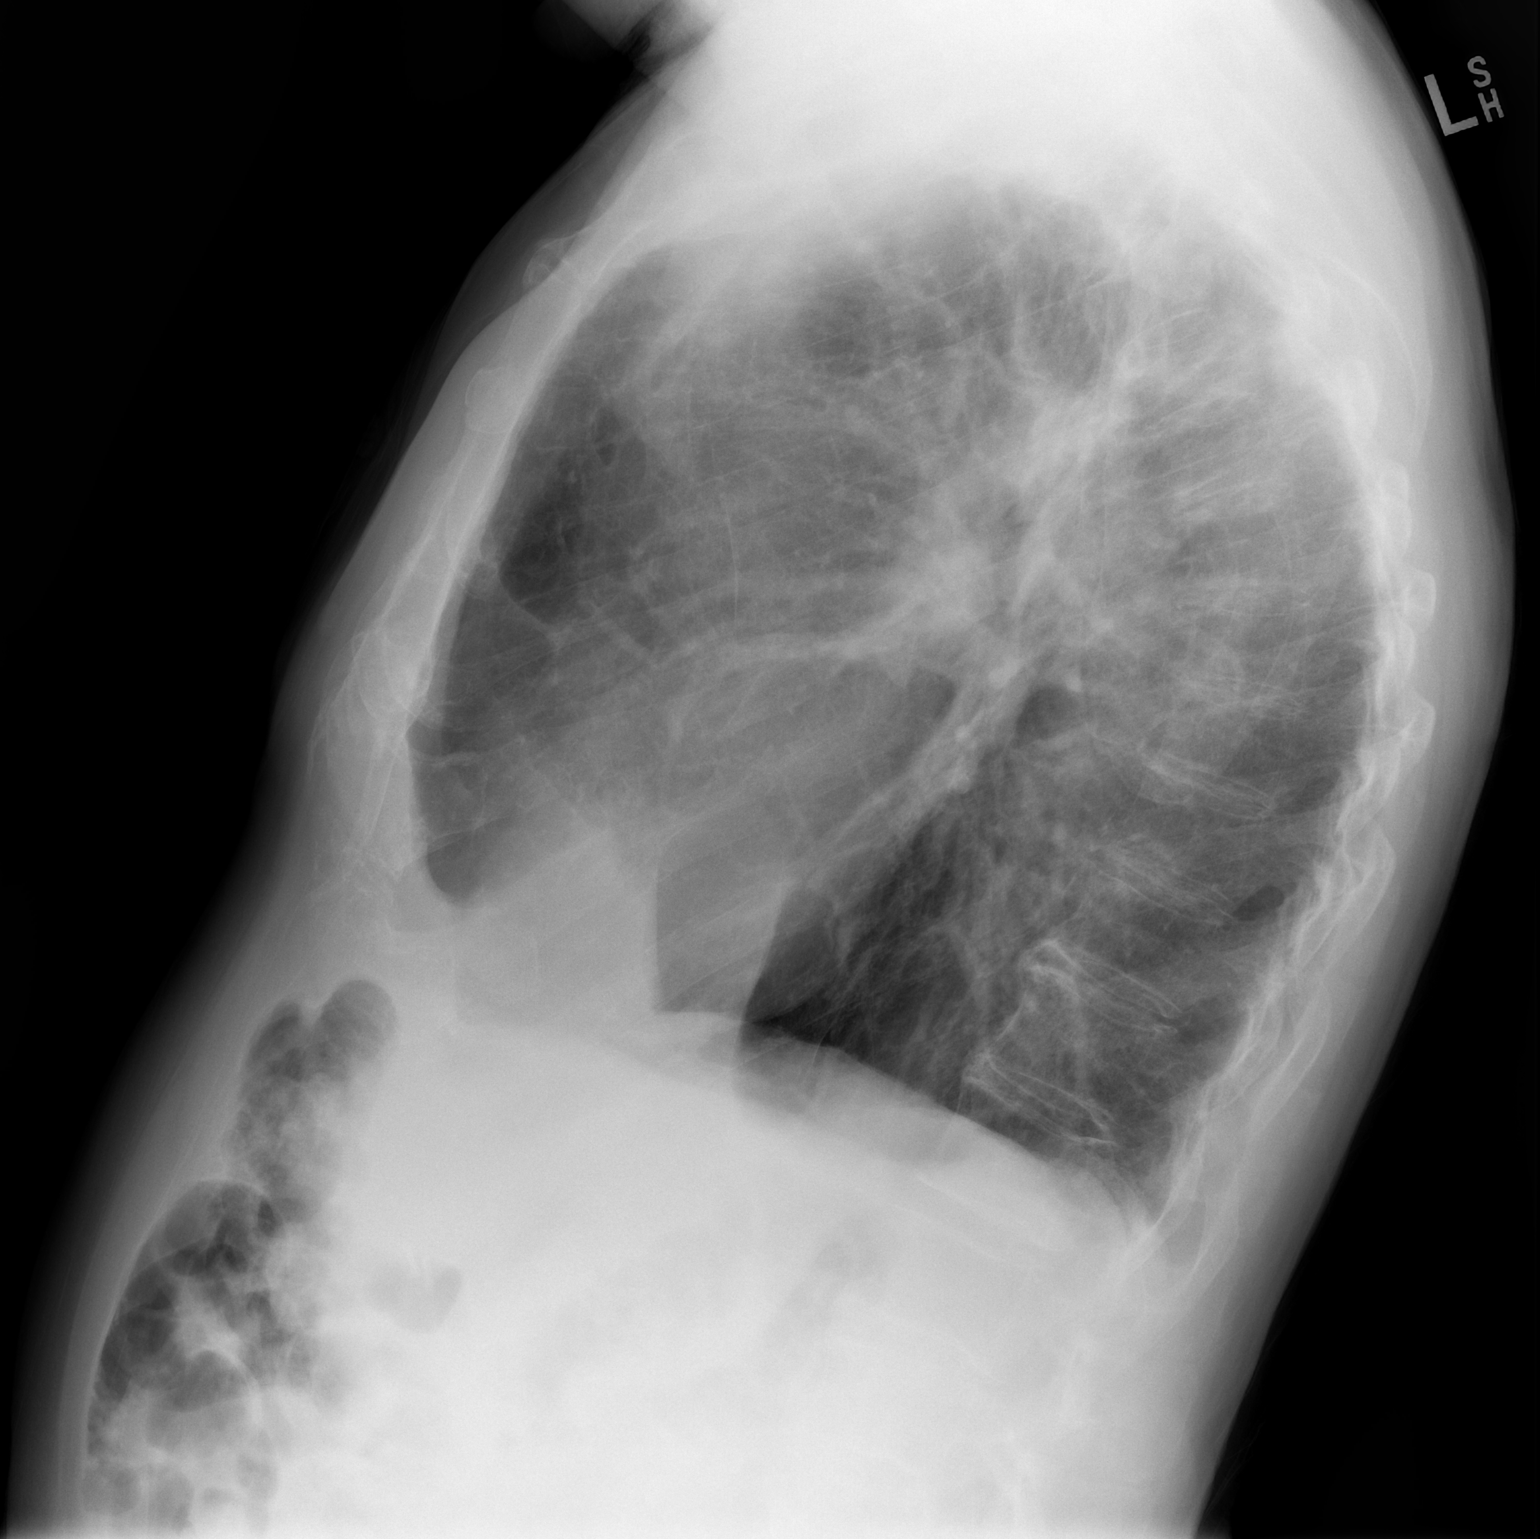

[2 of 2 positions shown; findings below may reference images not displayed]

FINDINGS: Cardiac shadow is within normal limits. The lungs are hyperinflated.
Previously seen pleuroparenchymal scarring is again identified. Some
interval clearing in the right apex is noted. Mild interstitial
changes are again seen. No new focal abnormality is noted.
IMPRESSION: Some clearing of right upper lobe infiltrate.

Chronic changes similar to that seen on prior exam.

## 2018-09-15 ENCOUNTER — Telehealth: Payer: Self-pay

## 2018-09-15 NOTE — Telephone Encounter (Signed)
Pharmacy sent request for new RX for Citalopram 40mg  tablets.   Medication is not on current med list, but not able to see if this was discontinued on purpose or if pt should still be taking?  Can you please advise

## 2018-09-16 NOTE — Telephone Encounter (Signed)
I'd call family and double check - 90 days supply was last sent in 02/2018 so he must have been off of this for some time unless they had extra stashed and he's been taking it without it being on his list. Let's call family - talk to wife Holley Raring or daughter Katharine Look they'll know

## 2018-09-16 NOTE — Telephone Encounter (Signed)
Called and spoke with Pt's daughter, he is not on Rx for Citalopram. No refill needed.

## 2018-09-19 ENCOUNTER — Ambulatory Visit: Payer: Medicare Other | Admitting: Osteopathic Medicine

## 2018-09-19 ENCOUNTER — Other Ambulatory Visit: Payer: Self-pay

## 2018-09-19 DIAGNOSIS — G8929 Other chronic pain: Secondary | ICD-10-CM

## 2018-09-19 DIAGNOSIS — M25571 Pain in right ankle and joints of right foot: Principal | ICD-10-CM

## 2018-09-19 MED ORDER — DICLOFENAC SODIUM 1 % TD GEL: 4.0000 g | Freq: Four times a day (QID) | TRANSDERMAL | 1 refills | Status: DC

## 2018-09-26 ENCOUNTER — Other Ambulatory Visit: Payer: Self-pay

## 2018-09-26 ENCOUNTER — Encounter: Payer: Self-pay | Admitting: Neurology

## 2018-09-26 ENCOUNTER — Ambulatory Visit (INDEPENDENT_AMBULATORY_CARE_PROVIDER_SITE_OTHER): Payer: Medicare Other | Admitting: Neurology

## 2018-09-26 ENCOUNTER — Encounter

## 2018-09-26 VITALS — BP 114/58 | HR 46 | Ht 70.0 in | Wt 136.0 lb

## 2018-09-26 DIAGNOSIS — G2581 Restless legs syndrome: Secondary | ICD-10-CM

## 2018-09-26 DIAGNOSIS — F03B18 Unspecified dementia, moderate, with other behavioral disturbance: Secondary | ICD-10-CM

## 2018-09-26 DIAGNOSIS — F0391 Unspecified dementia with behavioral disturbance: Secondary | ICD-10-CM | POA: Diagnosis not present

## 2018-09-26 MED ORDER — QUETIAPINE FUMARATE 50 MG PO TABS: 50.0000 mg | ORAL_TABLET | Freq: Every day | ORAL | 3 refills | Status: DC

## 2018-09-26 MED ORDER — FLUOXETINE HCL 20 MG PO CAPS: 20.0000 mg | ORAL_CAPSULE | Freq: Every day | ORAL | 3 refills | Status: DC

## 2018-09-26 MED ORDER — DONEPEZIL HCL 10 MG PO TABS: 10.0000 mg | ORAL_TABLET | Freq: Every day | ORAL | 3 refills | Status: DC

## 2018-09-26 MED ORDER — CLONAZEPAM 0.5 MG PO TABS: ORAL_TABLET | ORAL | 3 refills | Status: DC

## 2018-09-26 NOTE — Patient Instructions (Addendum)
1. Increase Fluoxetine to 20mg  daily. With your current prescription of Fluoxetine 10mg , take 2 tablets daily. Once done, your new prescription will be for 20mg , take 1 tablet daily.  2. Continue all your other medications  3. Refer to Dr. Casimiro Needle for Geriatric Psychiatry  A referral has been placed to Dr. Casimiro Needle.  His information is as follows:  Address: [address], [place], [place] [address]  Phone: [phone]   4. Follow-up in 6 months, call for any changes

## 2018-09-26 NOTE — Progress Notes (Signed)
NEUROLOGY FOLLOW UP OFFICE NOTE  Bill Hinton [id number] 09/07/1932  HISTORY OF PRESENT ILLNESS: I had the pleasure of seeing Bill Hinton in follow-up in the neurology clinic on 09/26/2018.  The patient was last seen 3 months ago for moderate dementia with behavioral disturbance. He is again accompanied by his wife and daughter who help supplement the history today. MMSE 20/27 (vision impaired) last August 2019. He is taking Donepezil 10mg  daily without side effects. On his last visit, family expressed concerned about worsening tactile hallucinations, as well as recurrence of daytime drowsiness (this previously improved with reduction in Seroquel dose). We discussed going back to Seroquel 50mg  qhs, and switching from citalopram to fluoxetine to see if this helps with the tactile hallucinations. His family reports that the daytime drowsiness is significantly better with medication changes. The picking behavior however is getting worse, he still feels there are worms crawling around his body, he is constantly picking at his hands and his right ear has had some bleeding due to repeated scratching. They have noticed mild bilateral hand tremors, sometimes also with his left foot. His walking is good with a walker, no shuffling gait or falls. No incontinence. His wife manages finances, his daughter fills his pillbox. He denies any headaches, dizziness, vision changes, focal numbness/tingling/weakness. He is on clonazepam 0.5mg  1/2 tab qhs for RLS with good response.  History on Initial Assessment 07/12/2017: This is a pleasant 84 year old ambidextrous right-hand dominant man with a history of COPD, CHF, and dementia, presenting to establish care. He reports his memory is not too good. He forgets things he used to know such as locations, distances. He stopped driving 8 months ago due to vision problems, denied getting lost driving. His wife took over bill payments a year ago due to his vision issues. She  administers his medications. He states he can see an image but cannot see well. His wife and daughter report that memory has been progressively worsening over the past 2 years. They report his long-term memory is "excellent," but short-term memory is a problem. He forgets more, needing help in the shower for the past 2-3 months. He can dress himself but his wife usually assists him. He can follow instructions per wife. He has not been sleeping well, usually waking up after 3-4 hours and putting his clothes on to sleep in the recliner. Family denies any wandering. He had been seeing neurologist Dr. Wynelle Link, per wife he had an MRI brain but they are not aware of the results. He has been taking Donepezil, Citalopram, and Seroquel 100mg  qhs. His wife reports that he has been having visual hallucinations, sometimes reporting seeing a dog or a child by his recliner. Over the past 2 weeks, he thinks there is a worm under his skin and has tried to pull it out from his hand. He was on a higher dose of Seroquel which cause daytime drowsiness. On the 100mg  dose, he still wakes up at night after 3-4 hours of sleep. He is drowsy in the office today, family reports this is his usual nap time. Family reports right facial weakness 3 years ago, he was brought to [facility] where he was diagnosed with Bell's palsy, however they report that since then, everything went downhill. He was apparently in good shape until the Bell's palsy and has had vision changes and speech changes afterwards. He had speech therapy recently for dysphagia, which has helped a lot. Family denies any paranoia, he is more irritable and snaps  more easily. He has spells where he hollers for his mother when he is asleep, this does not occur in the daytime.   He has a history of restless leg syndrome, his wife reports that Requip 1mg  qhs has helped a lot. He states that his legs still jump, as well as his arms. He is noted to have myoclonic jerks in the  office today in either leg/arm, and trunk. Body jerks have been going on for at least 2 years. He has occasional dizziness. He has a lot of back pain and aching on his left leg. He reports a history of left ankle injury that has affected his left leg. He has mild neck pain. He denies any headaches, focal numbness/tingling, bowel/bladder dysfunction. He has noticed a decrease in sense of smell. He has occasional hand tremors. There is no family history of dementia. He denies any history of significant head injury, no alcohol use.   PAST MEDICAL HISTORY: Past Medical History:  Diagnosis Date  . AAA (abdominal aortic aneurysm) ([place]) 07/23/2017   US done 07/22/17: 2.7 x 3.2 cm  Recommend followup by ultrasound in 3 years  . COPD (chronic obstructive pulmonary disease) ([place])   . Reflux   . Spontaneous pneumothorax     MEDICATIONS: Current Outpatient Medications on File Prior to Visit  Medication Sig Dispense Refill  . acetaminophen (TYLENOL) 500 MG tablet Take 1-2 tablets (500-1,000 mg total) by mouth every 6 (six) hours as needed (maximum 8 pills per 24 hours). 180 tablet 1  . aspirin 81 MG chewable tablet Chew 1 tablet (81 mg total) by mouth daily. 90 tablet 3  . Cholecalciferol (VITAMIN D-3) 1000 units CAPS Take 1 capsule by mouth daily.    . clonazePAM (KLONOPIN) 0.5 MG tablet Take 1/2 tablet 30 minutes before bedtime 15 tablet 5  . clotrimazole-betamethasone (LOTRISONE) cream Apply 1 application topically 2 (two) times daily. For rash on foot. 45 g 1  . diclofenac sodium (VOLTAREN) 1 % GEL Apply 4 g topically 4 (four) times daily. For arthritic pain. 1 Tube 1  . donepezil (ARICEPT) 10 MG tablet Take 1 tablet (10 mg total) by mouth daily. 90 tablet 3  . FLUoxetine (PROZAC) 10 MG capsule Take 1 capsule (10 mg total) by mouth daily. 90 capsule 3  . furosemide (LASIX) 20 MG tablet Take 1 tablet (20 mg total) by mouth daily. 90 tablet 0  . ipratropium-albuterol (DUONEB) 0.5-2.5 (3) MG/3ML SOLN  Inhale into the lungs.    . lansoprazole (PREVACID) 30 MG capsule Take 1 capsule (30 mg total) by mouth daily at 12 noon. 90 capsule 3  . montelukast (SINGULAIR) 10 MG tablet Take 10 mg by mouth daily.  11  . mupirocin ointment (BACTROBAN) 2 % Apply twice daily with dressing change for one week. 22 g 0  . potassium chloride (KLOR-CON) 20 MEQ packet Take 20 mEq by mouth 2 (two) times daily. 100 packet 3  . predniSONE (DELTASONE) 10 MG tablet Take 3 by mouth (30 mg) with breakfast daily for 1 week until you see your lung dr. (Patient taking differently: Take 10 mg by mouth. Take 1 by mouth (10 mg) with breakfast daily for 1 week until you see your lung dr.) 21 tablet 0  . QUEtiapine (SEROQUEL) 50 MG tablet Take 1 tablet (50 mg total) by mouth at bedtime. 30 tablet 6  . rosuvastatin (CRESTOR) 5 MG tablet TAKE 1 TABLET(5 MG) BY MOUTH AT BEDTIME 90 tablet 0   No current facility-administered medications  on file prior to visit.     ALLERGIES: Allergies  Allergen Reactions  . Fluticasone-Salmeterol Other (See Comments) and Anaphylaxis    Blisters in mouth Blisters in mouth  . Cefepime Other (See Comments)    Myoclonus  . Hydromorphone Hcl Other (See Comments)  . Theophylline Other (See Comments)    Other reaction(s): Other (See Comments) Throat irritation     FAMILY HISTORY: Family History  Problem Relation Age of Onset  . Heart attack Mother   . Heart attack Father     SOCIAL HISTORY: Social History   Socioeconomic History  . Marital status: Married    Spouse name: Not on file  . Number of children: Not on file  . Years of education: Not on file  . Highest education level: Not on file  Occupational History  . Not on file  Social Needs  . Financial resource strain: Not on file  . Food insecurity:    Worry: Not on file    Inability: Not on file  . Transportation needs:    Medical: Not on file    Non-medical: Not on file  Tobacco Use  . Smoking status: Former Smoker     Last attempt to quit: 10/15/1998    Years since quitting: 19.9  . Smokeless tobacco: Never Used  Substance and Sexual Activity  . Alcohol use: No  . Drug use: No  . Sexual activity: Not on file  Lifestyle  . Physical activity:    Days per week: Not on file    Minutes per session: Not on file  . Stress: Not on file  Relationships  . Social connections:    Talks on phone: Not on file    Gets together: Not on file    Attends religious service: Not on file    Active member of club or organization: Not on file    Attends meetings of clubs or organizations: Not on file    Relationship status: Not on file  . Intimate partner violence:    Fear of current or ex partner: Not on file    Emotionally abused: Not on file    Physically abused: Not on file    Forced sexual activity: Not on file  Other Topics Concern  . Not on file  Social History Narrative   Lives in 1 story home with wife and 1 son   Has 6 adult children   Highest level of education: 7th grade   Retired [employer] ([employer])    [place]: Constitutional: No fevers, chills, or sweats, no generalized fatigue, change in appetite Eyes: No visual changes, double vision, eye pain Ear, nose and throat: No hearing loss, ear pain, nasal congestion, sore throat Cardiovascular: No chest pain, palpitations Respiratory:  No shortness of breath at rest or with exertion, wheezes GastrointestinaI: No nausea, vomiting, diarrhea, abdominal pain, fecal incontinence Genitourinary:  No dysuria, urinary retention or frequency Musculoskeletal:  No neck pain, back pain Integumentary: No rash, pruritus, skin lesions Neurological: as above Psychiatric: No depression, insomnia, anxiety Endocrine: No palpitations, fatigue, diaphoresis, mood swings, change in appetite, change in weight, increased thirst Hematologic/Lymphatic:  No anemia, purpura, petechiae. Allergic/Immunologic: no itchy/runny eyes, nasal congestion, recent allergic  reactions, rashes  PHYSICAL EXAM: Vitals:   09/26/18 0859  BP: (!) 114/58  Pulse: (!) 46  SpO2: 92%   General: No acute distress, he is again noted to close his eyes to sleep during the visit, but is easily arousable to answer questions  and follow commands Head:  Normocephalic/atraumatic Neck: supple, no paraspinal tenderness, full range of motion Heart:  Regular rate and rhythm Lungs:  Clear to auscultation bilaterally Back: No paraspinal tenderness Skin/Extremities: No rash, no edema Neurological Exam: alert and oriented to person, place, and time (except date). No aphasia or dysarthria. Fund of knowledge is appropriate.  Recent and remote memory are impaired. 0/3 delayed recall. Attention and concentration are normal, 4/5 WORLD backwards.  Able to name objects and repeat phrases. Cranial nerves: Pupils equal, round, reactive to light. He has vision loss more on the left eye and sees better with covering left eye. Extraocular movements intact with no nystagmus. Visual fields full. Facial sensation intact. No facial asymmetry. Tongue, uvula, palate midline.  Motor: Bulk and tone normal, no cogwheeling, muscle strength 5/5 throughout with no pronator drift.  Sensation to light touch intact.  No extinction to double simultaneous stimulation.  Deep tendon reflexes +1 throughout, toes downgoing.  Finger to nose testing intact. Gait narrow-based with good stride with walker, no shuffling gait noted, no ataxia. He has a mild high frequency low amplitude postural tremor, no resting or endpoint tremor.   IMPRESSION: This is an 84 yo ambidextrous right-hand dominant man with a history of COPD, CHF, RLS, right Bell's palsy, and mild to moderate dementia with behavioral disturbance. He has a mild tremor but no Parkinsonian signs. MMSE 20/27 (vision impaired) in August 2019. Continue Donepezil 10mg  daily. The daytime drowsiness is better with Seroquel 50mg  taken at bedtime. He is also on low dose clonazepam  0.5mg  1/2 tab qhs for RLS (previous attempt to wean off caused worsening of RLS). Main concern today continues to be formication, day and night, causing skin abrasions due to constant scratching. We have tried switching to Fluoxetine with minimal effect, but will try increasing dose to 20mg  daily. We discussed seeing geriatric psychiatry to help with the tactile hallucinations. Follow-up in 6 months, they know to call for any changes.   Thank you for allowing me to participate in his care.  Please do not hesitate to call for any questions or concerns.  The duration of this appointment visit was 30 minutes of face-to-face time with the patient.  Greater than 50% of this time was spent in counseling, explanation of diagnosis, planning of further management, and coordination of care.   Ellouise Newer, M.D.   CC: Dr. Sheppard Coil

## 2018-09-29 ENCOUNTER — Telehealth: Payer: Self-pay

## 2018-09-29 ENCOUNTER — Ambulatory Visit: Payer: Medicare Other | Admitting: Neurology

## 2018-09-29 NOTE — Telephone Encounter (Signed)
Received notice from [facility] advising that they have reached out to pt to schedule appt.  Notice states that message was left on pt VM for him/family to call back and schedule at his/their convenience.

## 2018-10-01 ENCOUNTER — Other Ambulatory Visit: Payer: Self-pay

## 2018-10-01 DIAGNOSIS — R21 Rash and other nonspecific skin eruption: Secondary | ICD-10-CM

## 2018-10-01 MED ORDER — CLOTRIMAZOLE-BETAMETHASONE 1-0.05 % EX CREA: 1.0000 "application " | TOPICAL_CREAM | Freq: Two times a day (BID) | CUTANEOUS | 1 refills | Status: DC

## 2018-10-03 ENCOUNTER — Ambulatory Visit: Payer: Medicare Other | Admitting: Neurology

## 2018-10-10 DIAGNOSIS — J9621 Acute and chronic respiratory failure with hypoxia: Secondary | ICD-10-CM | POA: Diagnosis not present

## 2018-10-10 DIAGNOSIS — J471 Bronchiectasis with (acute) exacerbation: Secondary | ICD-10-CM | POA: Diagnosis not present

## 2018-10-10 DIAGNOSIS — I2510 Atherosclerotic heart disease of native coronary artery without angina pectoris: Secondary | ICD-10-CM | POA: Diagnosis not present

## 2018-10-10 DIAGNOSIS — R0602 Shortness of breath: Secondary | ICD-10-CM | POA: Diagnosis not present

## 2018-10-17 ENCOUNTER — Ambulatory Visit: Payer: Medicare Other | Admitting: Osteopathic Medicine

## 2018-11-07 ENCOUNTER — Telehealth: Payer: Self-pay | Admitting: Osteopathic Medicine

## 2018-11-07 DIAGNOSIS — M7989 Other specified soft tissue disorders: Secondary | ICD-10-CM

## 2018-11-07 MED ORDER — FUROSEMIDE 20 MG PO TABS: 20.0000 mg | ORAL_TABLET | Freq: Every day | ORAL | 1 refills | Status: DC

## 2018-11-07 NOTE — Telephone Encounter (Signed)
Refill needed

## 2018-11-24 ENCOUNTER — Ambulatory Visit (INDEPENDENT_AMBULATORY_CARE_PROVIDER_SITE_OTHER): Payer: Medicare Other | Admitting: Osteopathic Medicine

## 2018-11-24 ENCOUNTER — Ambulatory Visit (INDEPENDENT_AMBULATORY_CARE_PROVIDER_SITE_OTHER): Payer: Medicare Other | Admitting: *Deleted

## 2018-11-24 ENCOUNTER — Encounter: Payer: Self-pay | Admitting: Osteopathic Medicine

## 2018-11-24 VITALS — BP 112/49 | HR 66 | Wt 136.0 lb

## 2018-11-24 VITALS — BP 112/49 | HR 66 | Ht 70.0 in | Wt 136.0 lb

## 2018-11-24 DIAGNOSIS — I5022 Chronic systolic (congestive) heart failure: Secondary | ICD-10-CM

## 2018-11-24 DIAGNOSIS — Z0000 Encounter for general adult medical examination without abnormal findings: Secondary | ICD-10-CM | POA: Diagnosis not present

## 2018-11-24 DIAGNOSIS — F0390 Unspecified dementia without behavioral disturbance: Secondary | ICD-10-CM

## 2018-11-24 DIAGNOSIS — R7303 Prediabetes: Secondary | ICD-10-CM

## 2018-11-24 DIAGNOSIS — J449 Chronic obstructive pulmonary disease, unspecified: Secondary | ICD-10-CM | POA: Diagnosis not present

## 2018-11-24 DIAGNOSIS — I10 Essential (primary) hypertension: Secondary | ICD-10-CM | POA: Diagnosis not present

## 2018-11-24 DIAGNOSIS — I2510 Atherosclerotic heart disease of native coronary artery without angina pectoris: Secondary | ICD-10-CM | POA: Diagnosis not present

## 2018-11-24 NOTE — Progress Notes (Signed)
Subjective:   Bill Hinton is a 85 y.o. male who presents for Medicare Annual/Subsequent preventive examination.  Review of Systems:  No ROS.  Medicare Wellness Visit. Additional risk factors are reflected in the social history.  Cardiac Risk Factors include: advanced age (>64men, >47 women);dyslipidemia;male gender Sleep patterns: Getting on average of 6-7 hours of sleep. Occasionally wakes up during the night to go to the bathroom. Wakes up feeling groggy and explained to patient that probably due to the medications he was on at bedtime. Home Safety/Smoke Alarms: Feels safe in home. Smoke alarms in place.  Living environment;  Wife and son lives in the home that is a one story no steps. SHower  Is a step over shower and grab bars are in place Seat Belt Safety/Bike Helmet: Wears seat belt.    Male:   CCS- aged out    PSA- patietndeclines    Objective:    Vitals: There were no vitals taken for this visit.  There is no height or weight on file to calculate BMI.  Advanced Directives 11/24/2018  Does Patient Have a Medical Advance Directive? Yes  Type of Paramedic of [place];Living will  Does patient want to make changes to medical advance directive? No - Patient declined  Copy of [place] in Chart? No - copy requested    Tobacco Social History   Tobacco Use  Smoking Status Former Smoker  . Last attempt to quit: 10/15/1998  . Years since quitting: 20.1  Smokeless Tobacco Never Used     Counseling given: Not Answered   Clinical Intake:  Pre-visit preparation completed: Yes  Pain : No/denies pain     Nutritional Risks: None Diabetes: No  How often do you need to have someone help you when you read instructions, pamphlets, or other written materials from your doctor or pharmacy?: 5 - Always(cant see) What is the last grade level you completed in school?: 7th  Interpreter Needed?: No  Information entered by :: Orlie Dakin, LPN  Past Medical History:  Diagnosis Date  . AAA (abdominal aortic aneurysm) ([place]) 07/23/2017   US done 07/22/17: 2.7 x 3.2 cm  Recommend followup by ultrasound in 3 years  . COPD (chronic obstructive pulmonary disease) ([place])   . Reflux   . Spontaneous pneumothorax    Past Surgical History:  Procedure Laterality Date  . CHOLECYSTECTOMY    . HERNIA REPAIR     Family History  Problem Relation Age of Onset  . Heart attack Mother   . Heart attack Father    Social History   Socioeconomic History  . Marital status: Married    Spouse name: Holley Raring  . Number of children: 6  . Years of education: 83th  . Highest education level: 7th grade  Occupational History  . 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 Financial resource strain: Not hard at all  . Food insecurity:    Worry: Never true    Inability: Never true  . Transportation needs:    Medical: No    Non-medical: No  Tobacco Use  . Smoking status: Former Smoker    Last attempt to quit: 10/15/1998    Years since quitting: 20.1  . Smokeless tobacco: Never Used  Substance and Sexual Activity  . Alcohol use: No  . Drug use: No  . Sexual activity: Not Currently  Lifestyle  . Physical activity:    Days per week: 7 days  Minutes per session: 40 min  . Stress: Not at all  Relationships  . Social connections:    Talks on phone: Never    Gets together: Once a week    Attends religious service: Never    Active member of club or organization: No    Attends meetings of clubs or organizations: Never    Relationship status: Married  Other Topics Concern  . 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 acetaminophen (TYLENOL) 500 MG tablet Take 1-2 tablets  (500-1,000 mg total) by mouth every 6 (six) hours as needed (maximum 8 pills per 24 hours).  Marland Kitchen aspirin 81 MG chewable tablet Chew 1 tablet (81 mg total) by mouth daily.  . Cholecalciferol (VITAMIN D-3) 1000 units CAPS Take 1 capsule by mouth daily.  . clonazePAM (KLONOPIN) 0.5 MG tablet Take 1/2 tablet 30 minutes before bedtime  . clotrimazole-betamethasone (LOTRISONE) cream Apply 1 application topically 2 (two) times daily. For rash on foot.  . diclofenac sodium (VOLTAREN) 1 % GEL Apply 4 g topically 4 (four) times daily. For arthritic pain.  Marland Kitchen donepezil (ARICEPT) 10 MG tablet Take 1 tablet (10 mg total) by mouth daily.  Marland Kitchen FLUoxetine (PROZAC) 20 MG capsule Take 1 capsule (20 mg total) by mouth daily.  . furosemide (LASIX) 20 MG tablet Take 1 tablet (20 mg total) by mouth daily.  Marland Kitchen ipratropium-albuterol (DUONEB) 0.5-2.5 (3) MG/3ML SOLN Inhale into the lungs.  . montelukast (SINGULAIR) 10 MG tablet Take 10 mg by mouth daily.  . predniSONE (DELTASONE) 10 MG tablet Take 3 by mouth (30 mg) with breakfast daily for 1 week until you see your lung dr. (Patient taking differently: Take 10 mg by mouth. Take 1 by mouth (10 mg) with breakfast daily for 1 week until you see your lung dr.)  . QUEtiapine (SEROQUEL) 50 MG tablet Take 1 tablet (50 mg total) by mouth at bedtime.  . rosuvastatin (CRESTOR) 5 MG tablet TAKE 1 TABLET(5 MG) BY MOUTH AT BEDTIME  . lansoprazole (PREVACID) 30 MG capsule Take 1 capsule (30 mg total) by mouth daily at 12 noon. (Patient not taking: Reported on 11/24/2018)  . mupirocin ointment (BACTROBAN) 2 % Apply twice daily with dressing change for one week. (Patient not taking: Reported on 11/24/2018)  . potassium chloride (KLOR-CON) 20 MEQ packet Take 20 mEq by mouth 2 (two) times daily. (Patient taking differently: Take 20 mEq by mouth daily. )   No facility-administered encounter medications on file as of 11/24/2018.     Activities of Daily Living In your present state of health, do  you have any difficulty performing the following activities: 11/24/2018 08/22/2018  Hearing? N N  Vision? Y N  Comment gradually getting bad and macular degeneration -  Difficulty concentrating or making decisions? Y N  Walking or climbing stairs? N N  Dressing or bathing? N N  Doing errands, shopping? Y N  Comment needs wife with him -  Conservation officer, nature and eating ? N -  Using the Toilet? N -  In the past six months, have you accidently leaked urine? Y -  Comment goes to bathroom all day -  Do you have problems with loss of  bowel control? N -  Managing your Medications? N -  Comment wife handles medications due to eye sight -  Managing your Finances? N -  Comment wife manages -  Housekeeping or managing your Housekeeping? N -  Comment wife manages -  Some recent data might be hidden    Patient Care Team: Emeterio Reeve, DO as PCP - General (Osteopathic Medicine)   Assessment:   This is a routine wellness examination for [place].Physical assessment deferred to PCP.   Exercise Activities and Dietary recommendations Current Exercise Habits: Home exercise routine, Type of exercise: walking;stretching;strength training/weights, Time (Minutes): 60, Frequency (Times/Week): 7, Weekly Exercise (Minutes/Week): 420, Intensity: Mild Diet Eats a healthy diet of vegetables, some fruits does like apple sauce, meats Breakfast:egg sausage or oatmeal grits Lunch: skips lunch Dinner: meat- chicken fish, vegertables      Goals    . DIET - EAT MORE FRUITS AND VEGETABLES     Also try and eat at lunch time even if nothing but cheese and crackers, strawberries or bluberries       Fall Risk Fall Risk  11/24/2018 09/26/2018 06/05/2018 12/06/2017 07/12/2017  Falls in the past year? 0 0 No No No  Number falls in past yr: - 0 - - -  Injury with Fall? - 0 - - -  Risk Factor Category  - - - - -  Risk for fall due to : Impaired balance/gait;Impaired mobility;Impaired vision - - - -  Follow up Falls  prevention discussed - - - -   Is the patient's home free of loose throw rugs in walkways, pet beds, electrical cords, etc?   yes      Grab bars in the bathroom? yes      Handrails on the stairs?   no      Adequate lighting?   yes  Depression Screen PHQ 2/9 Scores 11/24/2018 08/22/2018 10/04/2017 04/08/2017  PHQ - 2 Score 0 0 0 1  PHQ- 9 Score - 1 - -    Cognitive Function MMSE - Mini Mental State Exam 06/05/2018 07/12/2017  Orientation to time 3 2  Orientation to Place 5 5  Registration 3 3  Attention/ Calculation 2 1  Recall 1 1  Language- name 2 objects 2 2  Language- repeat 1 1  Language- follow 3 step command 3 3  Language- read & follow direction 0 1  Write a sentence 0 1  Copy design 0 0  Total score 20 20     6CIT Screen 11/24/2018  What Year? 4 points  What month? 0 points  What time? 0 points  Count back from 20 0 points  Months in reverse 4 points  Repeat phrase 2 points  Total Score 10    Immunization History  Administered Date(s) Administered  . Influenza Split 07/11/2012, 07/27/2015, 07/17/2016  . Influenza, High Dose Seasonal PF 07/05/2017, 07/16/2018  . Influenza, Seasonal, Injecte, Preservative Fre 07/27/2015  . Influenza,inj,Quad PF,6+ Mos 07/27/2015  . Pneumococcal Conjugate-13 09/06/2015  . Pneumococcal Polysaccharide-23 03/16/2004  . Td 03/17/2007  . Tdap 11/03/2013    Screening Tests Health Maintenance  Topic Date Due  . TETANUS/TDAP  11/04/2023  . INFLUENZA VACCINE  Completed  . PNA vac Low Risk Adult  Completed        Plan:    Mr. Musick , Thank you for taking time to come for your Medicare Wellness Visit. I appreciate your ongoing commitment to your health goals. Please review the following plan we discussed and let  me know if I can assist you in the future.  Please schedule your next medicare wellness visit with me in 1 yr. Bring a copy of your living will and/or healthcare power of attorney to your next office visit.   These  are the goals we discussed: Goals    . DIET - EAT MORE FRUITS AND VEGETABLES     Also try and eat at lunch time even if nothing but cheese and crackers, strawberries or bluberries       This is a list of the screening recommended for you and due dates:  Health Maintenance  Topic Date Due  . Tetanus Vaccine  11/04/2023  . Flu Shot  Completed  . Pneumonia vaccines  Completed     I have personally reviewed and noted the following in the patient's chart:   . Medical and social history . Use of alcohol, tobacco or illicit drugs  . Current medications and supplements . Functional ability and status . Nutritional status . Physical activity . Advanced directives . List of other physicians . Hospitalizations, surgeries, and ER visits in previous 12 months . Vitals . Screenings to include cognitive, depression, and falls . Referrals and appointments  In addition, I have reviewed and discussed with patient certain preventive protocols, quality metrics, and best practice recommendations. A written personalized care plan for preventive services as well as general preventive health recommendations were provided to patient.     Joanne Chars, LPN  2/35/3614

## 2018-11-24 NOTE — Patient Instructions (Addendum)
Plan to decrease the Lasix / furosemide 20 mg tablet to half tablet (10 mg) daily and let mw know what the blood pressures are looking like this week.   I was going to check some blood work today, but let's hold off! I think we can recheck a few things next time I see him in 4-6 months. Blood work looked ok back in October. I think visits with me every 4-6 months is fine unless y'all need me sooner.

## 2018-11-24 NOTE — Progress Notes (Signed)
HPI: Bill Hinton is a 85 y.o. male who  has a past medical history of AAA (abdominal aortic aneurysm) ([place]) (07/23/2017), COPD (chronic obstructive pulmonary disease) ([place]), Reflux, and Spontaneous pneumothorax.  he presents to [facility] today, 11/24/18,  for chief complaint of:  Physical exam after medicare visit - check BP was low   BP was a bit low and nurse was concerned while he was here for his Medicare visit. No dizziness or falls. Daughter had increased his Lasix last week to 20 mg bid for a few days given increased swelling. Been back on 20 mg daily  Patient is accompanied by wife and daughter who assists with history-taking.   He is VERY upset that he has to see me today, "my insurance company didn't let me know about this, why are you sneaking appointments in on people" etc. Pt has known dementia and is prone to irritability and verbal outbursts.    At today's visit 11/24/18 ... PMH, PSH, FH reviewed and updated as needed.  Current medication list and allergy/intolerance hx reviewed and updated as needed. (See remainder of HPI, ROS, Phys Exam below)           ASSESSMENT/PLAN: The primary encounter diagnosis was Atherosclerosis of native coronary artery of native heart without angina pectoris. Diagnoses of Essential (primary) hypertension, Chronic systolic heart failure ([place]), Steroid-dependent chronic obstructive pulmonary disease ([place]), Dementia without behavioral disturbance, unspecified dementia type ([place]), and Prediabetes were also pertinent to this visit.   Heart and lungs sound as usual, I didn't see the point in making the patient any more irate today. Discussed with family, ok to defer visit. He would probably not tolerate a blood draw today, labs reviewed from October and no major concerns.         Patient Instructions  Plan to decrease the Lasix / furosemide 20 mg tablet to half tablet (10 mg) daily and let mw know what  the blood pressures are looking like this week.   I was going to check some blood work today, but let's hold off! I think we can recheck a few things next time I see him in 4-6 months. Blood work looked ok back in October. I think visits with me every 4-6 months is fine unless y'all need me sooner.        Follow-up plan: Return for recheck chronic issues 4-6 mos, sooner if needed.                                                 ################################################# ################################################# ################################################# #################################################    Current Meds  Medication Sig  . acetaminophen (TYLENOL) 500 MG tablet Take 1-2 tablets (500-1,000 mg total) by mouth every 6 (six) hours as needed (maximum 8 pills per 24 hours).  Marland Kitchen aspirin 81 MG chewable tablet Chew 1 tablet (81 mg total) by mouth daily.  . Cholecalciferol (VITAMIN D-3) 1000 units CAPS Take 1 capsule by mouth daily.  . clonazePAM (KLONOPIN) 0.5 MG tablet Take 1/2 tablet 30 minutes before bedtime  . clotrimazole-betamethasone (LOTRISONE) cream Apply 1 application topically 2 (two) times daily. For rash on foot.  . diclofenac sodium (VOLTAREN) 1 % GEL Apply 4 g topically 4 (four) times daily. For arthritic pain.  Marland Kitchen donepezil (ARICEPT) 10 MG tablet Take 1 tablet (10 mg total) by mouth daily.  Marland Kitchen  FLUoxetine (PROZAC) 20 MG capsule Take 1 capsule (20 mg total) by mouth daily.  . furosemide (LASIX) 20 MG tablet Take 1 tablet (20 mg total) by mouth daily.  Marland Kitchen ipratropium-albuterol (DUONEB) 0.5-2.5 (3) MG/3ML SOLN Inhale into the lungs.  . lansoprazole (PREVACID) 30 MG capsule Take 1 capsule (30 mg total) by mouth daily at 12 noon.  . montelukast (SINGULAIR) 10 MG tablet Take 10 mg by mouth daily.  . mupirocin ointment (BACTROBAN) 2 % Apply twice daily with dressing change for one week.  . potassium chloride  (KLOR-CON) 20 MEQ packet Take 20 mEq by mouth 2 (two) times daily. (Patient taking differently: Take 20 mEq by mouth daily. )  . predniSONE (DELTASONE) 10 MG tablet Take 3 by mouth (30 mg) with breakfast daily for 1 week until you see your lung dr. (Patient taking differently: Take 10 mg by mouth. Take 1 by mouth (10 mg) with breakfast daily for 1 week until you see your lung dr.)  . QUEtiapine (SEROQUEL) 50 MG tablet Take 1 tablet (50 mg total) by mouth at bedtime.  . rosuvastatin (CRESTOR) 5 MG tablet TAKE 1 TABLET(5 MG) BY MOUTH AT BEDTIME    Allergies  Allergen Reactions  . Fluticasone-Salmeterol Other (See Comments) and Anaphylaxis    Blisters in mouth Blisters in mouth  . Cefepime Other (See Comments)    Myoclonus  . Hydromorphone Hcl Other (See Comments)  . Theophylline Other (See Comments)    Other reaction(s): Other (See Comments) Throat irritation        Review of Systems: obtained from family   Constitutional: No recent illness  Cardiac: No  chest pain, some LE swelling last week   Respiratory:  No  shortness of breath other than usual DOE  Gastrointestinal: No  abdominal pain  Neurologic: No  weakness, No  Dizziness   Exam:  BP (!) 112/49 (BP Location: Left Arm, Patient Position: Sitting, Cuff Size: Normal)   Pulse 66   Wt 136 lb (61.7 kg)   BMI 19.51 kg/m   Constitutional: VS see above. General Appearance: alert,  thin but no cachectic  Neck: No masses, trachea midline.   Respiratory: Normal respiratory effort. no wheeze, no rhonchi, no rales, coarse breath sounds diffuse bilaterally   Cardiovascular: S1/S2 normal. RRR.   Psychiatric: Poor judgment/insight. Cranky mood and affect. .       Visit summary with medication list and pertinent instructions was printed for patient to review, patient was advised to alert [place] if any updates are needed. All questions at time of visit were answered - patient instructed to contact office with any additional  concerns. ER/RTC precautions were reviewed with the patient and understanding verbalized.   Note: Total time spent 15 minutes, greater than 50% of the visit was spent face-to-face counseling and coordinating care for the following: The primary encounter diagnosis was Atherosclerosis of native coronary artery of native heart without angina pectoris. 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 Please contact Dr. Sheppard Coil for any needed clarifications.    Follow up plan: Return for recheck chronic issues 4-6 mos, sooner if needed.

## 2018-11-24 NOTE — Patient Instructions (Signed)
Bill Hinton , Thank you for taking time to come for your Medicare Wellness Visit. I appreciate your ongoing commitment to your health goals. Please review the following plan we discussed and let me know if I can assist you in the future.  Please schedule your next medicare wellness visit with me in 1 yr. Bring a copy of your living will and/or healthcare power of attorney to your next office visit. These are the goals we discussed: Goals    . DIET - EAT MORE FRUITS AND VEGETABLES     Also try and eat at lunch time even if nothing but cheese and crackers, strawberries or bluberries

## 2018-11-27 DIAGNOSIS — I5022 Chronic systolic (congestive) heart failure: Secondary | ICD-10-CM | POA: Diagnosis not present

## 2018-11-27 DIAGNOSIS — R05 Cough: Secondary | ICD-10-CM | POA: Diagnosis not present

## 2018-11-27 DIAGNOSIS — J479 Bronchiectasis, uncomplicated: Secondary | ICD-10-CM | POA: Diagnosis not present

## 2018-11-27 DIAGNOSIS — R0902 Hypoxemia: Secondary | ICD-10-CM | POA: Diagnosis not present

## 2018-11-27 DIAGNOSIS — J449 Chronic obstructive pulmonary disease, unspecified: Secondary | ICD-10-CM | POA: Diagnosis not present

## 2018-12-10 ENCOUNTER — Other Ambulatory Visit: Payer: Self-pay | Admitting: Osteopathic Medicine

## 2018-12-24 DIAGNOSIS — E559 Vitamin D deficiency, unspecified: Secondary | ICD-10-CM | POA: Diagnosis not present

## 2018-12-24 DIAGNOSIS — Z1321 Encounter for screening for nutritional disorder: Secondary | ICD-10-CM | POA: Diagnosis not present

## 2018-12-24 DIAGNOSIS — M810 Age-related osteoporosis without current pathological fracture: Secondary | ICD-10-CM | POA: Diagnosis not present

## 2018-12-24 DIAGNOSIS — Z79899 Other long term (current) drug therapy: Secondary | ICD-10-CM | POA: Diagnosis not present

## 2018-12-24 DIAGNOSIS — Z7952 Long term (current) use of systemic steroids: Secondary | ICD-10-CM | POA: Diagnosis not present

## 2018-12-24 DIAGNOSIS — Z87891 Personal history of nicotine dependence: Secondary | ICD-10-CM | POA: Diagnosis not present

## 2019-01-21 ENCOUNTER — Other Ambulatory Visit: Payer: Self-pay | Admitting: Physician Assistant

## 2019-01-21 DIAGNOSIS — R21 Rash and other nonspecific skin eruption: Secondary | ICD-10-CM

## 2019-01-24 IMAGING — DX DG CHEST 2V
2 series · 2 of 2 positions shown · non-contrast
Comparison: 04/08/2017.

CLINICAL DATA: Cough and chest congestion for the past 2 days.
Myalgias. Ex-smoker.

EXAM:
CHEST  2 VIEW

[chest pa]
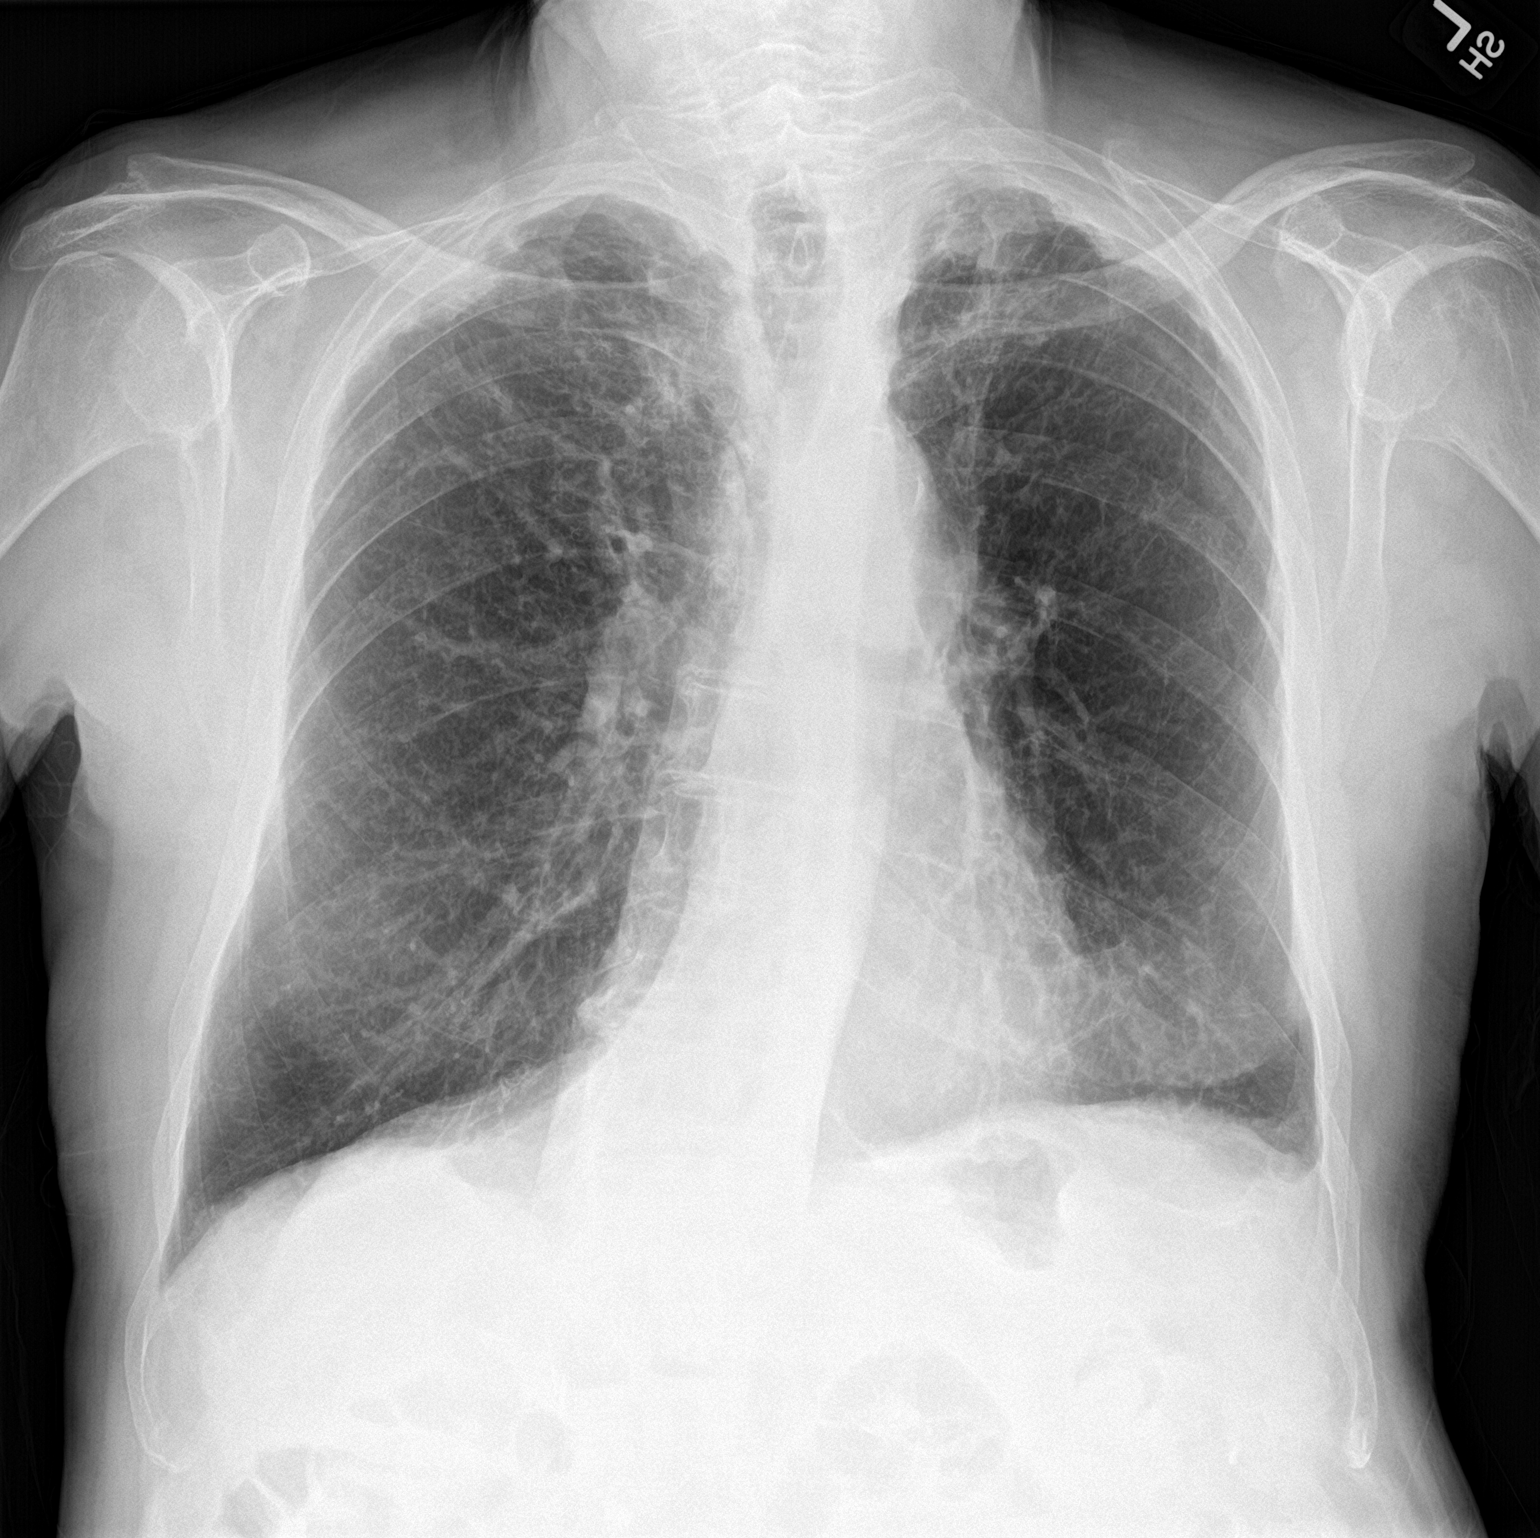

[chest lat]
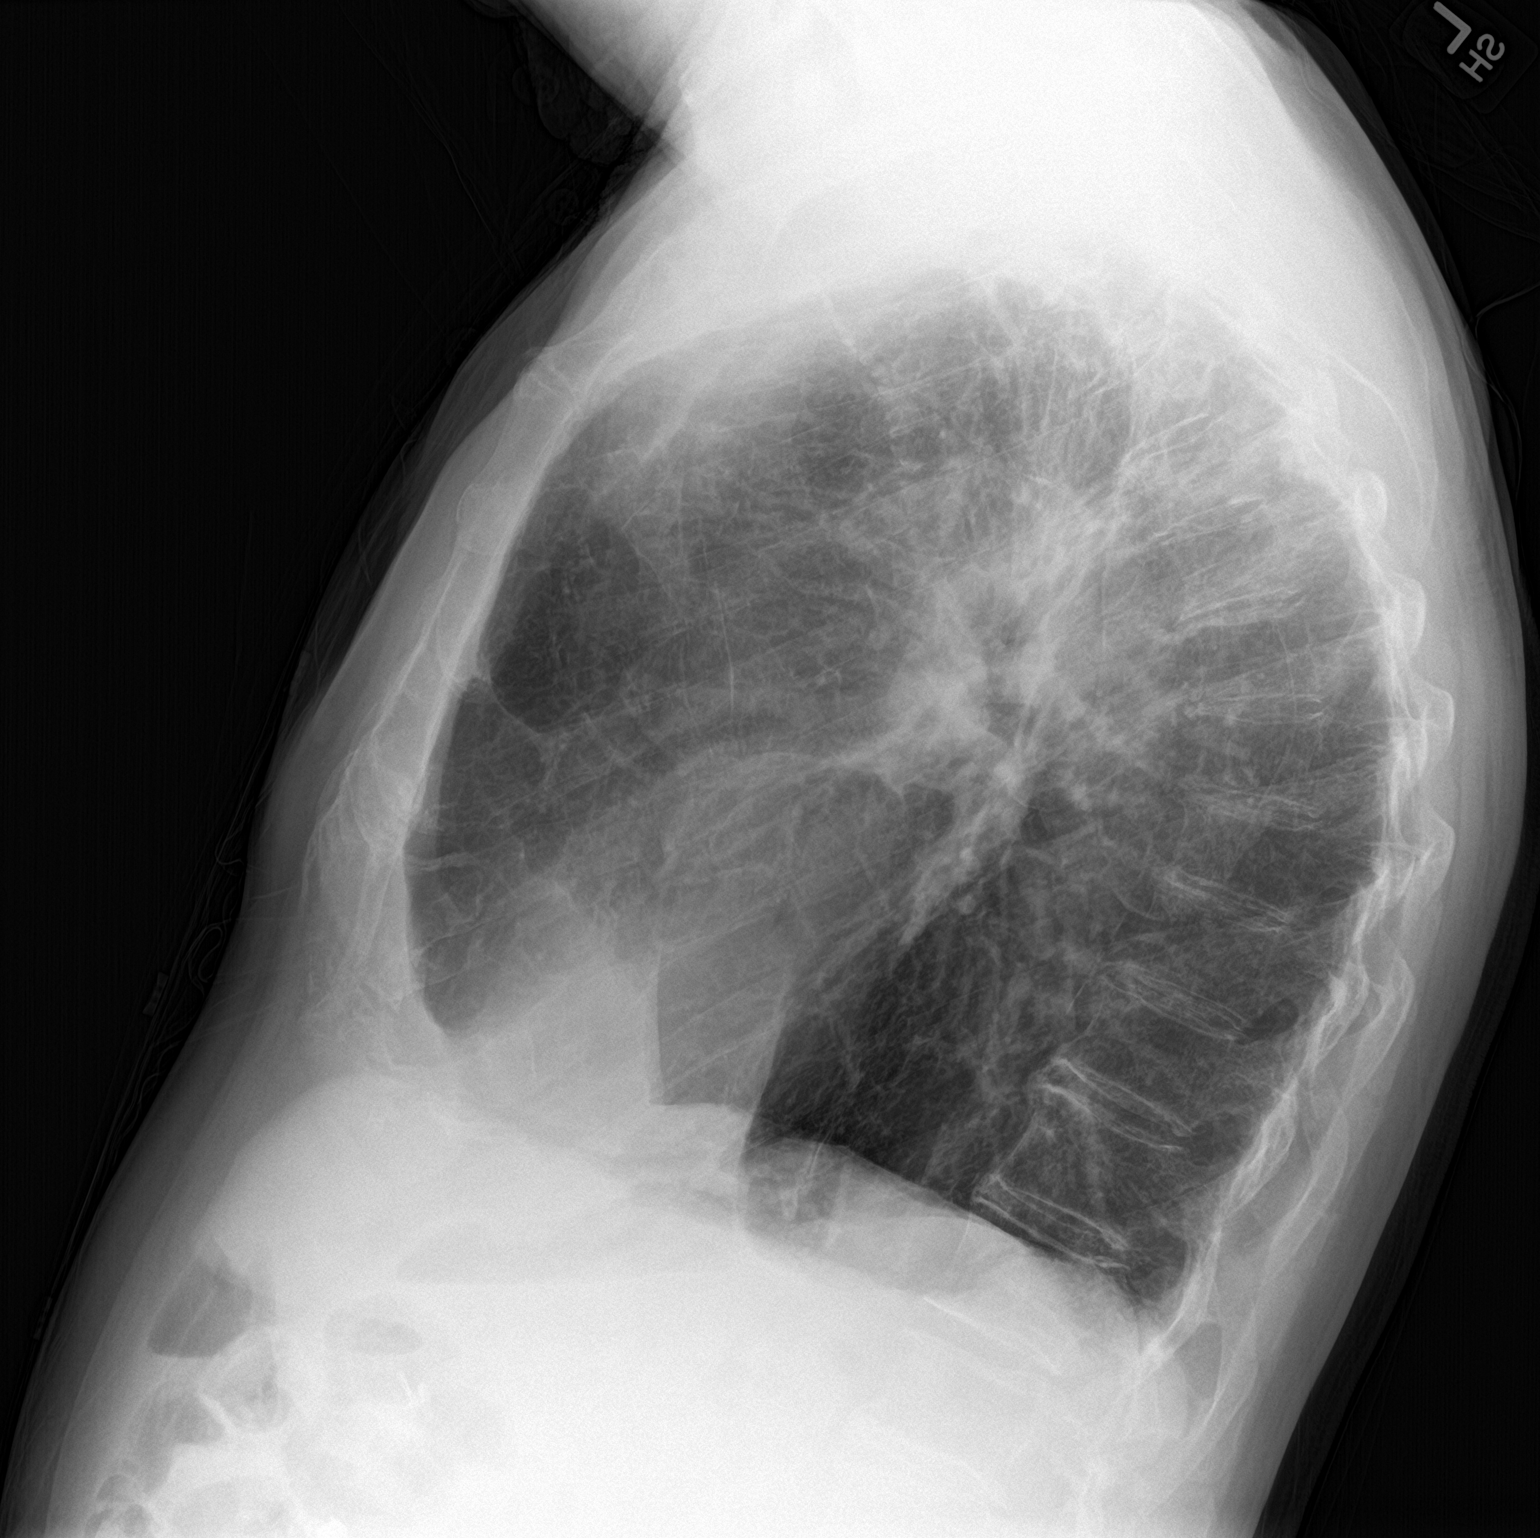

[2 of 2 positions shown; findings below may reference images not displayed]

FINDINGS: Normal sized heart. Clear lungs. The lungs remain hyperexpanded with
diffuse peribronchial thickening and accentuation of the
interstitial markings. Biapical pleural and parenchymal scarring is
unchanged. Diffuse osteopenia and mild thoracic spine degenerative
changes.
IMPRESSION: 1. No acute abnormality.
2. Stable changes of COPD and chronic bronchitis.

## 2019-02-04 ENCOUNTER — Other Ambulatory Visit: Payer: Self-pay | Admitting: Osteopathic Medicine

## 2019-02-04 NOTE — Telephone Encounter (Signed)
[facility] requesting med refill for potassium chloride. Pls advise, thanks.

## 2019-02-05 NOTE — Telephone Encounter (Signed)
Left a detailed vm msg for pt's daughter regarding med refill sent to pharmacy. Direct call back info provided.

## 2019-03-16 DIAGNOSIS — I5022 Chronic systolic (congestive) heart failure: Secondary | ICD-10-CM | POA: Diagnosis not present

## 2019-04-16 ENCOUNTER — Other Ambulatory Visit: Payer: Self-pay | Admitting: Physician Assistant

## 2019-04-16 DIAGNOSIS — G8929 Other chronic pain: Secondary | ICD-10-CM

## 2019-04-22 ENCOUNTER — Ambulatory Visit: Payer: Medicare Other | Admitting: Neurology

## 2019-05-08 ENCOUNTER — Telehealth (INDEPENDENT_AMBULATORY_CARE_PROVIDER_SITE_OTHER): Payer: Medicare Other | Admitting: Neurology

## 2019-05-08 ENCOUNTER — Encounter

## 2019-05-08 ENCOUNTER — Other Ambulatory Visit: Payer: Self-pay

## 2019-05-08 ENCOUNTER — Encounter: Payer: Self-pay | Admitting: Neurology

## 2019-05-08 VITALS — Ht 70.0 in | Wt 139.0 lb

## 2019-05-08 DIAGNOSIS — G2581 Restless legs syndrome: Secondary | ICD-10-CM

## 2019-05-08 DIAGNOSIS — F03B18 Unspecified dementia, moderate, with other behavioral disturbance: Secondary | ICD-10-CM

## 2019-05-08 DIAGNOSIS — F0391 Unspecified dementia with behavioral disturbance: Secondary | ICD-10-CM | POA: Diagnosis not present

## 2019-05-08 DIAGNOSIS — R442 Other hallucinations: Secondary | ICD-10-CM | POA: Diagnosis not present

## 2019-05-08 NOTE — Progress Notes (Signed)
Virtual Visit via Video Note The purpose of this virtual visit is to provide medical care while limiting exposure to the novel coronavirus.    Consent was obtained for video visit:  Yes.   Answered questions that patient had about telehealth interaction:  Yes.   I discussed the limitations, risks, security and privacy concerns of performing an evaluation and management service by telemedicine. I also discussed with the patient that there may be a patient responsible charge related to this service. The patient expressed understanding and agreed to proceed.  Pt location: Home Physician Location: office Name of referring provider:  Emeterio Reeve, DO I connected with Lexine Baton at patients initiation/request on 05/08/2019 at 10:00 AM EDT by video enabled telemedicine application and verified that I am speaking with the correct person using two identifiers. Pt MRN:  [id number] Pt DOB:  [birth year] Video Participants:  Lexine Baton;  Everlean Cherry (daughter)   History of Present Illness:  The patient was seen as a virtual video visit on 05/08/2019. He was last seen 7 months ago for moderate dementia with behavioral disturbance. MMSE 20/30 in 05/2018. His wife and daughter are present during the e-visit, daughter provides information. Most concerning continue to be tactile hallucinations thinking there are worms crawling all over his body, to the point of causing abrasions/bleeding from repeated scratching. Trial of citalopram, fluoxetine, and quetiapine have not helped. He continues on Fluoxetine 20mg  daily and quetiapine 50mg  qhs.He is also on clonazepam 0.5mg  1/2 tab qhs for RLS with good response. Family continues to report picking behaviors, he still feels like there are bugs on him, "they just irritate me." There is a sore on his scalp currently from the constant picking. They report his long term memory is excellent, short term memory is "okay." He sleeps well at night, he is again noted to  be drowsy during the visit when not stimulated. He takes a nap every 2 hours. He tries to walk to the driveway 4 times a day, family feels his walking ability has gotten excellent. No visual or auditory hallucinations. He is having more swallowing difficulties. He has a lot of pain in his right leg, no falls. He needs help with dressing and bathing but can use the bathroom by himself, feed himself. Family reports he is very,very mobile with his walker compared to last year.  History on Initial Assessment 07/12/2017: This is a pleasant 85 year old ambidextrous right-hand dominant man with a history of COPD, CHF, and dementia, presenting to establish care. He reports his memory is not too good. He forgets things he used to know such as locations, distances. He stopped driving 8 months ago due to vision problems, denied getting lost driving. His wife took over bill payments a year ago due to his vision issues. She administers his medications. He states he can see an image but cannot see well. His wife and daughter report that memory has been progressively worsening over the past 2 years. They report his long-term memory is "excellent," but short-term memory is a problem. He forgets more, needing help in the shower for the past 2-3 months. He can dress himself but his wife usually assists him. He can follow instructions per wife. He has not been sleeping well, usually waking up after 3-4 hours and putting his clothes on to sleep in the recliner. Family denies any wandering. He had been seeing neurologist Dr. Wynelle Link, per wife he had an MRI brain but they are not aware of the results. He  has been taking Donepezil, Citalopram, and Seroquel 100mg  qhs. His wife reports that he has been having visual hallucinations, sometimes reporting seeing a dog or a child by his recliner. Over the past 2 weeks, he thinks there is a worm under his skin and has tried to pull it out from his hand. He was on a higher dose of Seroquel which  cause daytime drowsiness. On the 100mg  dose, he still wakes up at night after 3-4 hours of sleep. He is drowsy in the office today, family reports this is his usual nap time. Family reports right facial weakness 3 years ago, he was brought to [facility] where he was diagnosed with Bell's palsy, however they report that since then, everything went downhill. He was apparently in good shape until the Bell's palsy and has had vision changes and speech changes afterwards. He had speech therapy recently for dysphagia, which has helped a lot. Family denies any paranoia, he is more irritable and snaps more easily. He has spells where he hollers for his mother when he is asleep, this does not occur in the daytime.   He has a history of restless leg syndrome, his wife reports that Requip 1mg  qhs has helped a lot. He states that his legs still jump, as well as his arms. He is noted to have myoclonic jerks in the office today in either leg/arm, and trunk. Body jerks have been going on for at least 2 years. He has occasional dizziness. He has a lot of back pain and aching on his left leg. He reports a history of left ankle injury that has affected his left leg. He has mild neck pain. He denies any headaches, focal numbness/tingling, bowel/bladder dysfunction. He has noticed a decrease in sense of smell. He has occasional hand tremors. There is no family history of dementia. He denies any history of significant head injury, no alcohol use.      Current Outpatient Medications on File Prior to Visit  Medication Sig Dispense Refill  . acetaminophen (TYLENOL) 500 MG tablet Take 1-2 tablets (500-1,000 mg total) by mouth every 6 (six) hours as needed (maximum 8 pills per 24 hours). 180 tablet 1  . aspirin 81 MG chewable tablet Chew 1 tablet (81 mg total) by mouth daily. 90 tablet 3  . Cholecalciferol (VITAMIN D-3) 1000 units CAPS Take 1 capsule by mouth daily.    . clonazePAM (KLONOPIN) 0.5 MG tablet Take 1/2 tablet  30 minutes before bedtime 45 tablet 3  . clotrimazole-betamethasone (LOTRISONE) cream APPLY TOPICALLY TWICE DAILY FOR RASH ON FOOT 45 g 1  . diclofenac sodium (VOLTAREN) 1 % GEL APPLY 4 GRAMS TOPICALLY TO THE AFFECTED AREA FOUR TIMES DAILY FOR ARTHRITIC PAIN. 100 g 1  . donepezil (ARICEPT) 10 MG tablet Take 1 tablet (10 mg total) by mouth daily. 90 tablet 3  . FLUoxetine (PROZAC) 20 MG capsule Take 1 capsule (20 mg total) by mouth daily. 90 capsule 3  . furosemide (LASIX) 20 MG tablet Take 1 tablet (20 mg total) by mouth daily. 90 tablet 1  . ipratropium-albuterol (DUONEB) 0.5-2.5 (3) MG/3ML SOLN Inhale into the lungs.    . lansoprazole (PREVACID) 30 MG capsule Take 1 capsule (30 mg total) by mouth daily at 12 noon. 90 capsule 3  . montelukast (SINGULAIR) 10 MG tablet Take 10 mg by mouth daily.  11  . mupirocin ointment (BACTROBAN) 2 % Apply twice daily with dressing change for one week. 22 g 0  . predniSONE (DELTASONE) 10  MG tablet Take 3 by mouth (30 mg) with breakfast daily for 1 week until you see your lung dr. (Patient taking differently: Take 10 mg by mouth. Take 1 by mouth (10 mg) with breakfast daily for 1 week until you see your lung dr.) 21 tablet 0  . QUEtiapine (SEROQUEL) 50 MG tablet Take 1 tablet (50 mg total) by mouth at bedtime. 90 tablet 3  . rosuvastatin (CRESTOR) 5 MG tablet TAKE 1 TABLET(5 MG) BY MOUTH AT BEDTIME 90 tablet 1  . potassium chloride (KLOR-CON) 20 MEQ packet Take 20 mEq by mouth daily. 90 packet 3   No current facility-administered medications on file prior to visit.      Observations/Objective:   Vitals:   05/08/19 0919  Weight: 139 lb (63 kg)  Height: 5\' 10"  (1.778 m)   GEN:  The patient appears stated age and is in NAD.  Neurological examination:Patient is awake, alert, oriented x 3. No aphasia, mild dysarthria. Intact fluency and comprehension. Remote and recent memory impaired, but able to remember 3/3 after 5 minutes. Difficulty with naming (vision  issues), difficulty with repetition. Cranial nerves: Extraocular movements intact with no nystagmus. No facial asymmetry. Motor: moves all extremities symmetrically, at least anti-gravity x 4. No incoordination on finger to nose testing. Gait: narrow-based with normal strides using walker, appears to favor right leg but denies pain. He is noted to have an upper body tremor.   MMSE - Mini Mental State Exam 05/08/2019 06/05/2018 07/12/2017  Orientation to time 4 3 2   Orientation to Place 3 5 5   Registration 3 3 3   Attention/ Calculation 1 2 1   Recall 3 1 1   Language- name 2 objects 0 2 2  Language- repeat 0 1 1  Language- follow 3 step command 3 3 3   Language- read & follow direction 0 0 1  Write a sentence 0 0 1  Copy design 0 0 0  Total score 17 20 20      Assessment and Plan:   This is an 85 yo ambidextrous right-hand dominant man with a history of COPD, CHF, RLS, right Bell's palsy, and mild to moderate dementia with behavioral disturbance. MMSE today 17/30 (20/30 in 05/2018). He again has mild tremor noted on video today. Main issue continues to be the picking behaviors and formication, causing skin abrasions from constant scratching. Trials of SSRI, Seroquel have not been helpful, they are now agreeable to seeing Psychiatry. Continue Donepezil 10mg  daily, Fluoxetine 20mg  daily, and Seroquel 50mg  qhs. He is on low dose clonazepam 0.5mg  1/2 tab qhs for RLS (previous attempt to wean off caused worsening of RLS). Follow-up in 6 months, they know to call for any changes.   Follow Up Instructions:   -I discussed the assessment and treatment plan with the patient. The patient was provided an opportunity to ask questions and all were answered. The patient agreed with the plan and demonstrated an understanding of the instructions.   The patient was advised to call back or seek an in-person evaluation if the symptoms worsen or if the condition fails to improve as anticipated.    Cameron Sprang, MD

## 2019-05-12 ENCOUNTER — Other Ambulatory Visit: Payer: Self-pay | Admitting: Osteopathic Medicine

## 2019-05-12 ENCOUNTER — Other Ambulatory Visit: Payer: Self-pay | Admitting: Neurology

## 2019-05-12 DIAGNOSIS — G2581 Restless legs syndrome: Secondary | ICD-10-CM

## 2019-05-12 DIAGNOSIS — M7989 Other specified soft tissue disorders: Secondary | ICD-10-CM

## 2019-05-13 NOTE — Telephone Encounter (Signed)
Dr. Delice Lesch,  Are you ok to refill this medication? I tried to eRx this yesterday but was unable due to the med category.

## 2019-05-20 MED ORDER — DONEPEZIL HCL 10 MG PO TABS: 10.0000 mg | ORAL_TABLET | Freq: Every day | ORAL | 3 refills | Status: DC

## 2019-05-20 MED ORDER — QUETIAPINE FUMARATE 50 MG PO TABS: 50.0000 mg | ORAL_TABLET | Freq: Every day | ORAL | 3 refills | Status: DC

## 2019-05-20 MED ORDER — FLUOXETINE HCL 20 MG PO CAPS: 20.0000 mg | ORAL_CAPSULE | Freq: Every day | ORAL | 3 refills | Status: DC

## 2019-05-21 ENCOUNTER — Other Ambulatory Visit: Payer: Self-pay

## 2019-05-21 DIAGNOSIS — R442 Other hallucinations: Secondary | ICD-10-CM

## 2019-06-04 ENCOUNTER — Other Ambulatory Visit: Payer: Self-pay | Admitting: Osteopathic Medicine

## 2019-06-04 NOTE — Telephone Encounter (Signed)
Forwarding medication refill request to PCP for review. 

## 2019-07-10 ENCOUNTER — Ambulatory Visit: Payer: Medicare Other

## 2019-07-16 ENCOUNTER — Other Ambulatory Visit: Payer: Self-pay | Admitting: Osteopathic Medicine

## 2019-07-16 DIAGNOSIS — R21 Rash and other nonspecific skin eruption: Secondary | ICD-10-CM

## 2019-07-17 ENCOUNTER — Other Ambulatory Visit: Payer: Self-pay

## 2019-07-17 ENCOUNTER — Ambulatory Visit (INDEPENDENT_AMBULATORY_CARE_PROVIDER_SITE_OTHER): Payer: Medicare Other | Admitting: Sports Medicine

## 2019-07-17 VITALS — Temp 98.4°F | Ht 70.0 in | Wt 137.0 lb

## 2019-07-17 DIAGNOSIS — Z23 Encounter for immunization: Secondary | ICD-10-CM

## 2019-07-17 DIAGNOSIS — R0600 Dyspnea, unspecified: Secondary | ICD-10-CM | POA: Diagnosis not present

## 2019-07-17 DIAGNOSIS — R609 Edema, unspecified: Secondary | ICD-10-CM | POA: Diagnosis not present

## 2019-07-17 DIAGNOSIS — I509 Heart failure, unspecified: Secondary | ICD-10-CM | POA: Diagnosis not present

## 2019-07-17 NOTE — Progress Notes (Signed)
Patient presents in car with wife and daughter for a drive by flu vaccine. Patient declined an allergy to eggs or latex, being sick in the last 48 hours, and no severe reaction to the high dose flu in the past. Patient tolerated injection well during the visit. I waited for a minute after to make sure he was feeling ok and he denied complications. Patient was instructed to look out for symptoms such as redness at the injection site, headache or fever and to call back with any problems. The patient and daughter voiced understanding.

## 2019-11-16 ENCOUNTER — Other Ambulatory Visit: Payer: Self-pay

## 2019-11-16 DIAGNOSIS — M7989 Other specified soft tissue disorders: Secondary | ICD-10-CM

## 2019-11-16 DIAGNOSIS — G2581 Restless legs syndrome: Secondary | ICD-10-CM

## 2019-11-16 MED ORDER — FUROSEMIDE 20 MG PO TABS: ORAL_TABLET | ORAL | 1 refills | Status: AC

## 2019-11-16 NOTE — Telephone Encounter (Signed)
They cancelled his last appointment in July, needs appointment. Can send refills until his appointment date made. Thanks

## 2019-11-19 ENCOUNTER — Telehealth (INDEPENDENT_AMBULATORY_CARE_PROVIDER_SITE_OTHER): Payer: Medicare Other | Admitting: Neurology

## 2019-11-19 ENCOUNTER — Other Ambulatory Visit: Payer: Self-pay

## 2019-11-19 ENCOUNTER — Encounter: Payer: Self-pay | Admitting: Neurology

## 2019-11-19 DIAGNOSIS — R442 Other hallucinations: Secondary | ICD-10-CM

## 2019-11-19 DIAGNOSIS — F0391 Unspecified dementia with behavioral disturbance: Secondary | ICD-10-CM | POA: Diagnosis not present

## 2019-11-19 DIAGNOSIS — G2581 Restless legs syndrome: Secondary | ICD-10-CM | POA: Diagnosis not present

## 2019-11-19 DIAGNOSIS — F03B18 Unspecified dementia, moderate, with other behavioral disturbance: Secondary | ICD-10-CM

## 2019-11-19 MED ORDER — RISPERIDONE 0.25 MG PO TABS: ORAL_TABLET | ORAL | 5 refills | Status: DC

## 2019-11-19 MED ORDER — CLONAZEPAM 0.5 MG PO TABS: ORAL_TABLET | ORAL | 3 refills | Status: AC

## 2019-11-19 MED ORDER — FLUOXETINE HCL 20 MG PO CAPS: 20.0000 mg | ORAL_CAPSULE | Freq: Every day | ORAL | 3 refills | Status: AC

## 2019-11-19 MED ORDER — DONEPEZIL HCL 10 MG PO TABS: 10.0000 mg | ORAL_TABLET | Freq: Every day | ORAL | 3 refills | Status: AC

## 2019-11-19 NOTE — Progress Notes (Signed)
Telephone (Audio) Visit The purpose of this telephone visit is to provide medical care while limiting exposure to the novel coronavirus.    Consent was obtained for telephone visit:  Yes.   Answered questions that patient had about telehealth interaction:  Yes.   I discussed the limitations, risks, security and privacy concerns of performing an evaluation and management service by telephone. I also discussed with the patient that there may be a patient responsible charge related to this service. The patient expressed understanding and agreed to proceed.  Pt location: Home Physician Location: office Name of referring provider:  Emeterio Reeve, DO I connected with .Lexine Baton at patients initiation/request on 11/19/2019 at 10:00 AM EST by telephone and verified that I am speaking with the correct person using two identifiers.  Pt MRN:  [id number] Pt DOB:  [birth year]   History of Present Illness:  The patient had a telephone visit on 11/19/2019. He was last seen in the neurology clinic 6 months ago for moderate dementia with behavioral disturbance (delusions). MMSE 17/30 in July 2020 (20/30 in August 2019). He is on Donepezil 10mg  daily without side effects. He and his family feel that his memory is good, he had difficulty with memory testing done over phone with staff, however when daughter repeated the 5 words with him, he did remarkably well. No difficulties following instructions. He needs help with dressing and bathing. Family manages finances and medications, he does not drive. Main concern continues to be the delusions/tactile hallucinations of worms crawling all over his body. His mostly picks at his ears/inside the ears and around his eyes. He states "it is a little worse, these things seem to grow more and more." He can get a handful off his face but does not see anything in his hands. No visual component. He is on Quetiapine 50mg  qhs and Fluoxetine 20mg  daily. He is drowsy during the  day, with 10 minute naps on and off all day. He is on low dose clonazepam 0.5mg  1/2 tab qhs for RLS, legs are not jerking all the time at night. He denies any headaches, dizziness, no falls, he ambulates with his walker.   History on Initial Assessment 07/12/2017: This is a pleasant 86 year old ambidextrous right-hand dominant man with a history of COPD, CHF, and dementia, presenting to establish care. He reports his memory is not too good. He forgets things he used to know such as locations, distances. He stopped driving 8 months ago due to vision problems, denied getting lost driving. His wife took over bill payments a year ago due to his vision issues. She administers his medications. He states he can see an image but cannot see well. His wife and daughter report that memory has been progressively worsening over the past 2 years. They report his long-term memory is "excellent," but short-term memory is a problem. He forgets more, needing help in the shower for the past 2-3 months. He can dress himself but his wife usually assists him. He can follow instructions per wife. He has not been sleeping well, usually waking up after 3-4 hours and putting his clothes on to sleep in the recliner. Family denies any wandering. He had been seeing neurologist Dr. Wynelle Link, per wife he had an MRI brain but they are not aware of the results. He has been taking Donepezil, Citalopram, and Seroquel 100mg  qhs. His wife reports that he has been having visual hallucinations, sometimes reporting seeing a dog or a child by his recliner. Over the  past 2 weeks, he thinks there is a worm under his skin and has tried to pull it out from his hand. He was on a higher dose of Seroquel which cause daytime drowsiness. On the 100mg  dose, he still wakes up at night after 3-4 hours of sleep. He is drowsy in the office today, family reports this is his usual nap time. Family reports right facial weakness 3 years ago, he was brought to [facility] where he was diagnosed with Bell's palsy, however they report that since then, everything went downhill. He was apparently in good shape until the Bell's palsy and has had vision changes and speech changes afterwards. He had speech therapy recently for dysphagia, which has helped a lot. Family denies any paranoia, he is more irritable and snaps more easily. He has spells where he hollers for his mother when he is asleep, this does not occur in the daytime.   He has a history of restless leg syndrome, his wife reports that Requip 1mg  qhs has helped a lot. He states that his legs still jump, as well as his arms. He is noted to have myoclonic jerks in the office today in either leg/arm, and trunk. Body jerks have been going on for at least 2 years. He has occasional dizziness. He has a lot of back pain and aching on his left leg. He reports a history of left ankle injury that has affected his left leg. He has mild neck pain. He denies any headaches, focal numbness/tingling, bowel/bladder dysfunction. He has noticed a decrease in sense of smell. He has occasional hand tremors. There is no family history of dementia. He denies any history of significant head injury, no alcohol use.    Current Outpatient Medications on File Prior to Visit  Medication Sig Dispense Refill   acetaminophen (TYLENOL) 500 MG tablet Take 1-2 tablets (500-1,000 mg total) by mouth every 6 (six) hours as needed (maximum 8 pills per 24 hours). 180 tablet 1   aspirin 81 MG chewable tablet Chew 1 tablet (81 mg total) by mouth daily. 90 tablet 3   Cholecalciferol (VITAMIN D-3) 1000 units CAPS Take 1 capsule by mouth daily.     clonazePAM (KLONOPIN) 0.5 MG tablet TAKE 1/2 TABLET BY MOUTH EVERY NIGHT 30 MINUTES BEFORE BEDTIME 45 tablet 3   clotrimazole-betamethasone (LOTRISONE) cream APPLY TOPICALLY ON FOOT TWICE DAILY FOR RASH 45 g 1   diclofenac sodium (VOLTAREN) 1 % GEL APPLY 4 GRAMS TOPICALLY TO THE AFFECTED AREA FOUR  TIMES DAILY FOR ARTHRITIC PAIN. 100 g 1   donepezil (ARICEPT) 10 MG tablet Take 1 tablet (10 mg total) by mouth daily. 90 tablet 3   FLUoxetine (PROZAC) 20 MG capsule Take 1 capsule (20 mg total) by mouth daily. 90 capsule 3   furosemide (LASIX) 20 MG tablet TAKE 1 TABLET(20 MG) BY MOUTH DAILY 90 tablet 1   ipratropium-albuterol (DUONEB) 0.5-2.5 (3) MG/3ML SOLN Inhale into the lungs.     lansoprazole (PREVACID) 30 MG capsule Take 1 capsule (30 mg total) by mouth daily at 12 noon. 90 capsule 3   montelukast (SINGULAIR) 10 MG tablet Take 10 mg by mouth daily.  11   mupirocin ointment (BACTROBAN) 2 % Apply twice daily with dressing change for one week. 22 g 0   potassium chloride (KLOR-CON) 20 MEQ packet Take 20 mEq by mouth daily. 90 packet 3   predniSONE (DELTASONE) 10 MG tablet Take 3 by mouth (30 mg) with breakfast daily for 1 week until  you see your lung dr. (Patient taking differently: Take 10 mg by mouth. Take 1 by mouth (10 mg) with breakfast daily for 1 week until you see your lung dr.) 21 tablet 0   QUEtiapine (SEROQUEL) 50 MG tablet Take 1 tablet (50 mg total) by mouth at bedtime. 90 tablet 3   rosuvastatin (CRESTOR) 5 MG tablet TAKE 1 TABLET(5 MG) BY MOUTH AT BEDTIME 90 tablet 1   No current facility-administered medications on file prior to visit.       Observations/Objective:   Limited due to nature of phone visit. Patient is awake, alert, oriented x 3. There is mild dysarthria, he is able to answer questions without confusion.  Montreal Cognitive Assessment Blind 11/18/2019  Attention: Read list of digits (0/2) 0  Attention: Read list of letters (0/1) 1  Attention: Serial 7 subtraction starting at 100 (0/3) 2  Language: Repeat phrase (0/2) 1  Language : Fluency (0/1) 0  Abstraction (0/2) 1  Delayed Recall (0/5) 0  Orientation (0/6) 6  Total 11  Adjusted Score (based on education) 12    Assessment and Plan:   This is an 86 yo ambidextrous right-hand dominant man  with a history of COPD, CHF, RLS, right Bell's palsy, and mild to moderate dementia with behavioral disturbance. MOCA blind today (done over phone) 12/22 (MMSE 17/30 in 04/2019, 20/30 in 05/2018). Family reports memory overall stable, continue Donepezil 10mg  daily. Main concern continues to be picking behaviors due to formication, we discussed weaning off Quetiapine to 1/2 tab qhs x 3 days then stop, then start Risperidone 0.25mg  1/2 tab qhs x 3 days, then if no side effects increase to 1 tab qhs. Side effects discussed, family will call with an update in a week, we may further increase dose as tolerated. He is also on Fluoxetine 20mg  daily, refills sent. He is on low dose clonazepam 0.5mg  1/2 tab qhs for RLS (previous attempt to wean off caused worsening of RLS). Follow-up in 6 months, they know to call for any changes.    Follow Up Instructions:   -I discussed the assessment and treatment plan with the patient. The patient was provided an opportunity to ask questions and all were answered. The patient agreed with the plan and demonstrated an understanding of the instructions.   The patient was advised to call back or seek an in-person evaluation if the symptoms worsen or if the condition fails to improve as anticipated.    Total Time spent in visit with the patient was:  29:05 minutes, of which 100% of the time was spent in counseling and/or coordinating care on the above.   Pt understands and agrees with the plan of care outlined.     Cameron Sprang, MD

## 2019-12-02 ENCOUNTER — Telehealth: Payer: Self-pay | Admitting: Neurology

## 2019-12-02 MED ORDER — RISPERIDONE 0.25 MG PO TABS: 0.2500 mg | ORAL_TABLET | Freq: Two times a day (BID) | ORAL | 5 refills | Status: DC

## 2019-12-02 NOTE — Addendum Note (Signed)
Addended by: Jake Seats on: 12/02/2019 03:08 PM   Modules accepted: Orders

## 2019-12-02 NOTE — Telephone Encounter (Signed)
Pt was started on Risperdal talked to pt daughter she stated that its not working pt is still picking he has actually picked a place on his hand. Asking if the medication is not strong enough? Or if he needs to go back to the other medication?

## 2019-12-02 NOTE — Telephone Encounter (Signed)
Left message with after hour service on 12-02-19 12:51pm   Caller states that her father was put on new medication ans she was told to call back and advise how the medication and she was told to call back and advised how the medication is doing

## 2019-12-02 NOTE — Telephone Encounter (Signed)
Spoke to pt daughter he is not having any side effects, they will  increase to 1 tab twice a day, or if too drowsy with daytime dose, can do 2 tabs qhs,

## 2019-12-02 NOTE — Telephone Encounter (Signed)
If he is not having any side effects, can increase to 1 tab twice a day, or if too drowsy with daytime dose, can do 2 tabs qhs. Thanks

## 2019-12-08 ENCOUNTER — Other Ambulatory Visit: Payer: Self-pay

## 2019-12-08 MED ORDER — ROSUVASTATIN CALCIUM 5 MG PO TABS: 5.0000 mg | ORAL_TABLET | Freq: Every day | ORAL | 0 refills | Status: DC

## 2019-12-12 ENCOUNTER — Other Ambulatory Visit: Payer: Self-pay

## 2019-12-12 ENCOUNTER — Emergency Department (INDEPENDENT_AMBULATORY_CARE_PROVIDER_SITE_OTHER): Payer: Medicare Other

## 2019-12-12 ENCOUNTER — Emergency Department (INDEPENDENT_AMBULATORY_CARE_PROVIDER_SITE_OTHER)
Admission: EM | Admit: 2019-12-12 | Discharge: 2019-12-12 | Disposition: A | Discharge status: Home / Self Care | Payer: Medicare Other | Source: Home / Self Care | Attending: Family Medicine | Admitting: Family Medicine

## 2019-12-12 DIAGNOSIS — M5432 Sciatica, left side: Secondary | ICD-10-CM

## 2019-12-12 DIAGNOSIS — M5136 Other intervertebral disc degeneration, lumbar region: Secondary | ICD-10-CM

## 2019-12-12 DIAGNOSIS — M545 Low back pain, unspecified: Secondary | ICD-10-CM

## 2019-12-12 MED ORDER — PREDNISONE 10 MG PO TABS: ORAL_TABLET | ORAL | 0 refills | Status: DC

## 2019-12-12 NOTE — ED Triage Notes (Signed)
Pt c/o LT hip pain x 1.5 weeks. Says it radiates towards back and sometimes down leg. Pain is intermittent, worse when walking. Uses walker regularly. Pain 8/10. Using heat and taking tylenol prn

## 2019-12-12 NOTE — ED Provider Notes (Signed)
[name]    CSN: [id number] Arrival date & time: 12/12/19  1323      History   Chief Complaint Chief Complaint  Patient presents with   Hip Pain    HPI Bill Hinton is a 86 y.o. male.   Patient has a history of chronic arthritis.  During the past 1.5 weeks he has had increasing intermittent pain in his left lower back radiating to his left lateral and lower leg.   He denies bowel or bladder dysfunction, and no saddle numbness.  He recalls no recent injury. His pain is worse when walking; he uses a walker regularly.  He presently takes prednisone 10mg  daily for his COPD.  The history is provided by the patient and a relative.  Back Pain Location:  Lumbar spine Quality:  Shooting and aching Radiates to:  L posterior upper leg, L thigh and L knee Pain severity:  Moderate Onset quality:  Gradual Duration:  10 days Timing:  Intermittent Progression:  Worsening Chronicity:  New Context: not falling, not lifting heavy objects and not recent injury   Relieved by:  Nothing Worsened by:  Bending, movement and ambulation Ineffective treatments:  Heating pad and OTC medications Associated symptoms: no abdominal pain, no bladder incontinence, no bowel incontinence, no dysuria, no fever, no leg pain, no numbness, no paresthesias, no perianal numbness, no tingling and no weakness   Risk factors: steroid use     Past Medical History:  Diagnosis Date   AAA (abdominal aortic aneurysm) ([place]) 07/23/2017   US done 07/22/17: 2.7 x 3.2 cm  Recommend followup by ultrasound in 3 years   COPD (chronic obstructive pulmonary disease) ([facility])    Reflux    Spontaneous pneumothorax     Patient Active Problem List   Diagnosis Date Noted   Moderate protein-calorie malnutrition ([place]) 08/22/2018   Dementia without behavioral disturbance ([place]) 08/22/2018   Recurrent major depressive disorder, in partial remission ([place]) 08/22/2018   Hypertensive heart and renal disease with  congestive heart failure ([place]) 08/22/2018   Osteoarthritis 08/22/2018   Skin cancer 10/04/2017   Chronic pain of left knee [id number]   Chronic systolic heart failure ([place]) 10/04/2017   Elevated serum creatinine 10/04/2017   AAA (abdominal aortic aneurysm) ([place]) 07/23/2017   Aorto-iliac atherosclerosis ([place]) 07/19/2017   Mass of upper lobe of right lung 04/09/2017   Thrombocytopenia ([place]) 04/09/2017   Benign prostatic hyperplasia 12/14/2016   CAD (coronary artery disease), native coronary artery 12/14/2016   Depression with anxiety 12/14/2016   History of nephrolithiasis 12/14/2016   Pneumothorax 12/14/2016   Tinnitus 12/14/2016   Vitamin D deficiency 12/14/2016   Rhinorrhea 12/14/2016   Declining functional status 09/26/2016   Restless leg syndrome 09/19/2016   Acute respiratory failure with hypoxia ([place]) 09/17/2016   Bell's palsy 09/17/2016   Bronchiectasis ([place]) 09/17/2016   Mounier-Kuhn bronchiectasis with acute exacerbation ([place]) 08/17/2016   Dysphagia 08/17/2016   Vasomotor rhinitis 08/17/2016   Right ureteral calculus 04/13/2015   Atherosclerotic heart disease of native coronary artery without angina pectoris 04/11/2015   Chronic kidney disease 04/11/2015   Diaphragmatic hernia without obstruction or gangrene 04/11/2015   Enlarged prostate without lower urinary tract symptoms ([place]) 04/11/2015   Essential (primary) hypertension 04/11/2015   Nocturia 04/11/2015   Other seasonal allergic rhinitis 04/11/2015   Panic disorder without agoraphobia 04/11/2015   Status post cataract extraction 01/21/2014   Presence of intraocular lens 01/21/2014   Pseudophakia of both eyes 01/21/2014   Nuclear sclerosis of right eye  01/19/2014   Nodular corneal degeneration 11/19/2013   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 Authorizing Provider  acetaminophen (TYLENOL) 500 MG tablet Take 1-2 tablets (500-1,000 mg total) by mouth every 6 (six) hours as needed (maximum 8 pills per 24 hours). 08/22/18   Emeterio Reeve, DO  aspirin 81 MG chewable tablet Chew 1 tablet (81 mg total) by mouth daily. 07/05/17   Emeterio Reeve, DO  Cholecalciferol (VITAMIN D-3) 1000 units CAPS Take 1 capsule by mouth daily.    [provider]  clonazePAM (KLONOPIN) 0.5 MG tablet TAKE 1/2 TABLET BY MOUTH EVERY NIGHT 30 MINUTES BEFORE BEDTIME 11/19/19   Cameron Sprang, MD  clotrimazole-betamethasone (LOTRISONE) cream APPLY TOPICALLY ON FOOT TWICE DAILY FOR RASH 07/17/19   Emeterio Reeve, DO  diclofenac sodium (VOLTAREN) 1 % GEL APPLY 4 GRAMS TOPICALLY TO THE AFFECTED AREA FOUR TIMES DAILY FOR ARTHRITIC PAIN. 04/20/19   Emeterio Reeve, DO  donepezil (ARICEPT) 10 MG tablet Take 1 tablet (10 mg total) by mouth daily. 11/19/19   Cameron Sprang, MD  FLUoxetine (PROZAC) 20 MG capsule Take 1 capsule (20 mg total) by mouth daily. 11/19/19   Cameron Sprang, MD  furosemide (LASIX) 20 MG tablet TAKE 1 TABLET(20 MG) BY MOUTH DAILY 11/16/19   Emeterio Reeve, DO  ipratropium-albuterol (DUONEB) 0.5-2.5 (3) MG/3ML SOLN Inhale into the lungs. 12/13/15   [provider]  lansoprazole (PREVACID) 30 MG capsule Take 1 capsule (30 mg total) by mouth daily at 12 noon. 07/05/17   Emeterio Reeve, DO  montelukast (SINGULAIR) 10 MG tablet Take 10 mg by mouth daily. 11/16/16   [provider]  mupirocin ointment (BACTROBAN) 2 % Apply twice daily with dressing change for one week. 02/07/18   Emeterio Reeve, DO  potassium chloride (KLOR-CON) 20 MEQ  packet Take 20 mEq by mouth daily. 02/05/19 05/06/19  Emeterio Reeve, DO  predniSONE (DELTASONE) 10 MG tablet Take one tab PO once daily with food. 12/12/19   Kandra Nicolas, MD  risperiDONE (RISPERDAL) 0.25 MG tablet Take 1 tablet (0.25 mg total) by mouth 2 (two) times daily. 12/02/19   Cameron Sprang, MD  rosuvastatin (CRESTOR) 5 MG tablet Take 1 tablet (5 mg total) by mouth at bedtime. **PATIENT NEEDS OFFICE VISIT FOR ADDITIONAL REFILLS** 12/08/19   Emeterio Reeve, DO    Family History Family History  Problem Relation Age of Onset   Heart attack Mother    Heart attack Father     Social History Social History   Tobacco Use   Smoking status: Former Smoker    Quit date: 10/15/1998    Years since quitting: 21.1   Smokeless tobacco: Current User    Types: Chew  Substance Use Topics   Alcohol use: No   Drug use: No     Allergies   Fluticasone-salmeterol, Cefepime, Hydromorphone hcl, and Theophylline   Review of Systems Review of Systems  Constitutional: Positive for activity change. Negative for chills, diaphoresis and fever.  Gastrointestinal: Negative for abdominal pain and bowel incontinence.  Genitourinary: Negative for bladder incontinence and dysuria.  Musculoskeletal:  Positive for back pain.  Neurological: Negative for tingling, weakness, numbness and paresthesias.  All other systems reviewed and are negative.    Physical Exam Triage Vital Signs ED Triage Vitals  Enc Vitals Group     BP 12/12/19 1342 140/67     Pulse Rate 12/12/19 1342 83     Resp 12/12/19 1342 16     Temp 12/12/19 1342 98.1 F (36.7 C)     Temp Source 12/12/19 1342 Oral     SpO2 12/12/19 1342 97 %     Weight --      Height --      Head Circumference --      Peak Flow --      Pain Score 12/12/19 1343 8     Pain Loc --      Pain Edu? --      Excl. in [place]? --    No data found.  Updated Vital Signs BP 140/67 (BP Location: Right Arm)    Pulse 83    Temp 98.1 F (36.7 C)  (Oral)    Resp 16    SpO2 97%   Visual Acuity Right Eye Distance:   Left Eye Distance:   Bilateral Distance:    Right Eye Near:   Left Eye Near:    Bilateral Near:     Physical Exam Vitals and nursing note reviewed.  Constitutional:      General: He is not in acute distress.    Appearance: He is not ill-appearing.     Comments: Patient needs assistance with ambulation.  HENT:     Head: Normocephalic.  Eyes:     Pupils: Pupils are equal, round, and reactive to light.  Cardiovascular:     Heart sounds: Normal heart sounds.  Pulmonary:     Breath sounds: Normal breath sounds.  Musculoskeletal:       Back:     Comments: Back:  Decreased range of motion. Tenderness in the right paraspinous muscles from L4 to Sacral area.  Straight leg raising test is positive on the let at 30 degrees.  Sitting knee extension test is positive on the left.  Strength in the lower extremities is decreased. Patellar and achilles reflexes are present.     Skin:    General: Skin is warm and dry.  Neurological:     Mental Status: He is alert.      UC Treatments / Results  Labs (all labs ordered are listed, but only abnormal results are displayed) Labs Reviewed - No data to display  EKG   Radiology DG Lumbar Spine Complete  Result Date: 12/12/2019 CLINICAL DATA:  Pain EXAM: LUMBAR SPINE - COMPLETE 4+ VIEW COMPARISON:  07/16/2017 FINDINGS: There is a mild dextroscoliosis centered in the lumbar spine. Multilevel facet arthrosis is noted throughout the lumbar spine. Multilevel disc height loss is noted throughout the visualized thoracolumbar spine. The abdominal aorta is heavily calcified in an aneurysmal, likely measuring up to approximately 3.5 cm. There appears to be a right common iliac artery aneurysm measuring approximately 2.5 cm. Multiple calcifications project over the patient's pelvis and are favored to represent phleboliths. Mild degenerative changes are noted of the bilateral  femoroacetabular joints. IMPRESSION: 1. Multilevel degenerative changes of the lumbar spine as described above. No acute osseous abnormality detected on this exam. 2. Aneurysmal dilatation of the abdominal aorta and right common iliac artery. Further evaluation with a nonemergent outpatient ultrasound is recommended. 3. Multiple calcifications project over the patient's pelvis and are favored  to represent phleboliths. Electronically Signed   By: Constance Holster M.D.   On: 12/12/2019 14:45    Procedures Procedures (including critical care time)  Medications Ordered in UC Medications - No data to display  Initial Impression / Assessment and Plan / UC Course  I have reviewed the triage vital signs and the nursing notes.  Pertinent labs & imaging results that were available during my care of the patient were reviewed by me and considered in my medical decision making (see chart for details).    Increase prednisone temporarily for 4 days. Note lumbar spine X-ray finding of aneurysmal dilatation of the abdominal aorta and right common iliac artery. Followup with Dr. Aundria Mems ([facility]) for further evaluation and management.   Final Clinical Impressions(s) / UC Diagnoses   Final diagnoses:  Sciatica of left side  Low back pain radiating to left lower extremity     Discharge Instructions     Increase prednisone to one tab twice daily for 4 days, then resume one tab daily. Apply ice pack to lower mid back or 15 to 20 minutes, 3 to 4 times daily  Continue until pain decreases.  Continue Tylenol for pain. Followup with your family doctor for evaluation of abdominal aortic aneurysm.    ED Prescriptions    Medication Sig Dispense Auth. Provider   predniSONE (DELTASONE) 10 MG tablet Take one tab PO once daily with food. 4 tablet Kandra Nicolas, MD        Kandra Nicolas, MD 12/14/19 2121

## 2019-12-12 NOTE — Discharge Instructions (Addendum)
Increase prednisone to one tab twice daily for 4 days, then resume one tab daily. Apply ice pack to lower mid back or 15 to 20 minutes, 3 to 4 times daily  Continue until pain decreases.  Continue Tylenol for pain. Followup with your family doctor for evaluation of abdominal aortic aneurysm.

## 2019-12-15 ENCOUNTER — Other Ambulatory Visit: Payer: Self-pay

## 2019-12-15 DIAGNOSIS — R21 Rash and other nonspecific skin eruption: Secondary | ICD-10-CM

## 2019-12-15 MED ORDER — CLOTRIMAZOLE-BETAMETHASONE 1-0.05 % EX CREA: TOPICAL_CREAM | CUTANEOUS | 1 refills | Status: AC

## 2019-12-17 ENCOUNTER — Other Ambulatory Visit: Payer: Self-pay

## 2019-12-17 ENCOUNTER — Encounter: Payer: Self-pay | Admitting: Family Medicine

## 2019-12-17 ENCOUNTER — Ambulatory Visit (INDEPENDENT_AMBULATORY_CARE_PROVIDER_SITE_OTHER): Payer: Medicare Other | Admitting: Family Medicine

## 2019-12-17 VITALS — BP 101/58 | HR 96 | Temp 98.4°F | Ht 70.0 in

## 2019-12-17 DIAGNOSIS — I714 Abdominal aortic aneurysm, without rupture, unspecified: Secondary | ICD-10-CM

## 2019-12-17 DIAGNOSIS — R109 Unspecified abdominal pain: Secondary | ICD-10-CM | POA: Diagnosis not present

## 2019-12-17 DIAGNOSIS — R339 Retention of urine, unspecified: Secondary | ICD-10-CM

## 2019-12-17 DIAGNOSIS — R39198 Other difficulties with micturition: Secondary | ICD-10-CM | POA: Insufficient documentation

## 2019-12-17 LAB — COMPLETE METABOLIC PANEL WITH GFR
AG Ratio: 1.4 (calc) (ref 1.0–2.5)
ALT: 10 U/L (ref 9–46)
AST: 12 U/L (ref 10–35)
Albumin: 3.3 g/dL — ABNORMAL LOW (ref 3.6–5.1)
Alkaline phosphatase (APISO): 77 U/L (ref 35–144)
BUN: 16 mg/dL (ref 7–25)
CO2: 27 mmol/L (ref 20–32)
Calcium: 9.3 mg/dL (ref 8.6–10.3)
Chloride: 103 mmol/L (ref 98–110)
Creat: 0.83 mg/dL (ref 0.70–1.11)
GFR, Est African American: 93 mL/min/{1.73_m2} (ref >=60)
GFR, Est Non African American: 80 mL/min/{1.73_m2} (ref >=60)
Globulin: 2.3 g/dL (calc) (ref 1.9–3.7)
Glucose, Bld: 95 mg/dL (ref 65–139)
Potassium: 3.4 mmol/L — ABNORMAL LOW (ref 3.5–5.3)
Sodium: 138 mmol/L (ref 135–146)
Total Bilirubin: 1.1 mg/dL (ref 0.2–1.2)
Total Protein: 5.6 g/dL — ABNORMAL LOW (ref 6.1–8.1)

## 2019-12-17 LAB — POCT URINALYSIS DIP (CLINITEK)
Bilirubin, UA: NEGATIVE
Blood, UA: NEGATIVE
Glucose, UA: NEGATIVE mg/dL
Ketones, POC UA: NEGATIVE mg/dL
Leukocytes, UA: NEGATIVE
Nitrite, UA: NEGATIVE
Spec Grav, UA: 1.025 (ref 1.010–1.025)
Urobilinogen, UA: 0.2 E.U./dL
pH, UA: 5.5 (ref 5.0–8.0)

## 2019-12-17 LAB — CBC WITH DIFFERENTIAL/PLATELET
Absolute Monocytes: 1023 cells/uL — ABNORMAL HIGH (ref 200–950)
Basophils Absolute: 16 cells/uL (ref 0–200)
Basophils Relative: 0.1 %
Eosinophils Absolute: 16 cells/uL (ref 15–500)
Eosinophils Relative: 0.1 %
HCT: 41.3 % (ref 38.5–50.0)
Hemoglobin: 13.9 g/dL (ref 13.2–17.1)
Lymphs Abs: 822 cells/uL — ABNORMAL LOW (ref 850–3900)
MCH: 32.7 pg (ref 27.0–33.0)
MCHC: 33.7 g/dL (ref 32.0–36.0)
MCV: 97.2 fL (ref 80.0–100.0)
MPV: 10 fL (ref 7.5–12.5)
Monocytes Relative: 6.6 %
Neutro Abs: 13625 cells/uL — ABNORMAL HIGH (ref 1500–7800)
Neutrophils Relative %: 87.9 %
Platelets: 237 10*3/uL (ref 140–400)
RBC: 4.25 10*6/uL (ref 4.20–5.80)
RDW: 11.8 % (ref 11.0–15.0)
Total Lymphocyte: 5.3 %
WBC: 15.5 10*3/uL — ABNORMAL HIGH (ref 3.8–10.8)

## 2019-12-17 LAB — PSA: PSA: 2.5 ng/mL (ref <=4.0)

## 2019-12-17 MED ORDER — CELECOXIB 100 MG PO CAPS: 100.0000 mg | ORAL_CAPSULE | Freq: Two times a day (BID) | ORAL | 0 refills | Status: DC

## 2019-12-17 NOTE — Assessment & Plan Note (Signed)
He has not been very mobile over the past 2 weeks due to pain from his OA.   Limited as to what we can utilize for management due to other medications and co-morbidities. He is on benzodiazepine and also has history of dementia and chronic respiratory disase.  Tramadol considered however I think this may worsen confusion and increase his risk for respiratory depression. Topicals NSAIDS may help knees but wouldn't do much good for back and hips.   Will try celebrex to lessen potential GI side effects as he is also on chronic steroids.  Renal function is normal today.

## 2019-12-17 NOTE — Progress Notes (Signed)
Bill Hinton - 86 y.o. male MRN [id number]  Date of birth: 1932-09-21  Subjective No chief complaint on file.   HPI Bill Hinton is a 86 y.o. male with history of HTN, CAD, COPD, HLD and BPH here today with complaint of urinary difficulty, constipation and joint pain. He was seen at urgent care on 12/12/19 for complaint of left lower back pain with radiation to L leg.  He had xray showing significant multilevel degenerative changes of the spine.  He is on chronic prednisone for COPD and this was increased to help with his symptoms.  He reports that he hasn't much improvement with this.  His wife and daughter report that he has not urinated since yesterday.  His last BM was two days ago and was only a small amount and hard.  He has been very sedentary over the past couple of weeks per his family due to his joint pain.  He denies urinary pain, groin pain, fever, chills, nausea or vomiting.  His appetite has been normal.  He denies increased shortness of breath or chest pain.    ROS:  A comprehensive ROS was completed and negative except as noted per HPI  Allergies  Allergen Reactions  . Fluticasone-Salmeterol Other (See Comments) and Anaphylaxis    Blisters in mouth Blisters in mouth  . Cefepime Other (See Comments)    Myoclonus  . Hydromorphone Hcl Other (See Comments)  . Theophylline Other (See Comments)    Other reaction(s): Other (See Comments) Throat irritation     Past Medical History:  Diagnosis Date  . AAA (abdominal aortic aneurysm) ([place]) 07/23/2017   US done 07/22/17: 2.7 x 3.2 cm  Recommend followup by ultrasound in 3 years  . COPD (chronic obstructive pulmonary disease) ([place])   . Reflux   . Spontaneous pneumothorax     Past Surgical History:  Procedure Laterality Date  . CHOLECYSTECTOMY    . HERNIA REPAIR      Social History   Socioeconomic History  . Marital status: Married    Spouse name: Bill Hinton  . Number of children: 6  . Years of education: 70th  . Highest  education level: 7th grade  Occupational History  . Occupation: truck Geophysicist/field seismologist    Comment: retired  Tobacco Use  . Smoking status: Former Smoker    Quit date: 10/15/1998    Years since quitting: 21.1  . Smokeless tobacco: Current User    Types: Chew  Substance and Sexual Activity  . Alcohol use: No  . Drug use: No  . Sexual activity: Not Currently  Other Topics Concern  . 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 Difficulty of Paying Living Expenses: Not on file  Food Insecurity:   . Worried About Charity fundraiser in the Last Year: Not on file  . Ran Out of Food in the Last Year: Not on file  Transportation Needs:   . Lack of Transportation (Medical): Not on file  . Lack of Transportation (Non-Medical): Not on file  Physical Activity:   . Days of Exercise per Week: Not on file  . Minutes of Exercise per Session: Not on file  Stress:   . Feeling of Stress :  Not on file  Social Connections:   . Frequency of Communication with Friends and Family: Not on file  . Frequency of Social Gatherings with Friends and Family: Not on file  . Attends Religious Services: Not on file  . Active Member of Clubs or Organizations: Not on file  . Attends Archivist Meetings: Not on file  . Marital Status: Not on file    Family History  Problem Relation Age of Onset  . Heart attack Mother   . Heart attack Father     Health Maintenance  Topic Date Due  . URINE MICROALBUMIN  04/08/2018  . TETANUS/TDAP  11/04/2023  . INFLUENZA VACCINE  Completed  . PNA vac Low Risk Adult  Completed      ----------------------------------------------------------------------------------------------------------------------------------------------------------------------------------------------------------------- Physical Exam BP (!) 101/58   Pulse 96   Temp 98.4 F (36.9 C) (Oral)   Ht 5\' 10"  (1.778 m)   BMI 20.09 kg/m   Physical Exam Constitutional:      Comments: In wheelchair  HENT:     Head: Normocephalic and atraumatic.     Mouth/Throat:     Mouth: Mucous membranes are moist.  Eyes:     General: No scleral icterus. Cardiovascular:     Rate and Rhythm: Normal rate and regular rhythm.  Pulmonary:     Effort: Pulmonary effort is normal.     Comments: Scattered rales Abdominal:     General: Abdomen is flat.     Comments: Mild distention and tenderness throughout.   Skin:    General: Skin is warm and dry.  Neurological:     General: No focal deficit present.     Mental Status: He is alert.  Psychiatric:        Mood and Affect: Mood normal.        Behavior: Behavior normal.     ------------------------------------------------------------------------------------------------------------------------------------------------------------------------------------------------------------------- Assessment and Plan  Osteoarthritis He has not been very mobile over the past 2 weeks due to pain from his OA.   Limited as to what we can utilize for management due to other medications and co-morbidities. He is on benzodiazepine and also has history of dementia and chronic respiratory disase.  Tramadol considered however I think this may worsen confusion and increase his risk for respiratory depression. Topicals NSAIDS may help knees but wouldn't do much good for back and hips.   Will try celebrex to lessen potential GI side effects as he is also on chronic steroids.  Renal function is normal today.     Difficulty voiding He was able to given urine sample today without  difficulty.   No signs of UTI based on this.  Renal function normal.  PSA checked and WNL, doubt prostatitis.   Abdominal pain Some abdominal pain and bloating, likely related to constipation.  He has been very sedentary due to his arthritis which has likely worsened constipation.  Will have him increase fluid intake some and add miralax daily.    AAA (abdominal aortic aneurysm) ([place]) Due for upated [place] this year.  Similar in size based on recent xray however family is concerned and want to go ahead and proceed with updated [place]   Meds ordered this encounter  Medications  . celecoxib (CELEBREX) 100 MG capsule    Sig: Take 1 capsule (100 mg total) by mouth 2 (two) times daily.    Dispense:  60 capsule    Refill:  0    F/u in 2 weeks or sooner if having any new or worsening symptoms.  This visit occurred during the SARS-CoV-2 public health emergency.  Safety protocols were in place, including screening questions prior to the visit, additional usage of staff PPE, and extensive cleaning of exam room while observing appropriate contact time as indicated for disinfecting solutions.

## 2019-12-17 NOTE — Assessment & Plan Note (Signed)
Some abdominal pain and bloating, likely related to constipation.  He has been very sedentary due to his arthritis which has likely worsened constipation.  Will have him increase fluid intake some and add miralax daily.

## 2019-12-17 NOTE — Patient Instructions (Signed)
Very nice to meet you today! Please have labs completed, we'll be in touch with results Your recent xray shows possible aneurysm of the large artery in your stomach.  I have ordered and ultrasound to get a better look at this.

## 2019-12-17 NOTE — Assessment & Plan Note (Signed)
He was able to given urine sample today without difficulty.   No signs of UTI based on this.  Renal function normal.  PSA checked and WNL, doubt prostatitis.

## 2019-12-17 NOTE — Progress Notes (Signed)
poc

## 2019-12-17 NOTE — Assessment & Plan Note (Signed)
Due for upated [place] this year.  Similar in size based on recent xray however family is concerned and want to go ahead and proceed with updated [place]

## 2019-12-24 ENCOUNTER — Other Ambulatory Visit: Payer: Self-pay | Admitting: Family Medicine

## 2019-12-24 ENCOUNTER — Telehealth: Payer: Self-pay | Admitting: Neurology

## 2019-12-24 ENCOUNTER — Telehealth: Payer: Self-pay | Admitting: Family Medicine

## 2019-12-24 MED ORDER — TRAMADOL HCL 50 MG PO TABS: 50.0000 mg | ORAL_TABLET | Freq: Three times a day (TID) | ORAL | 0 refills | Status: AC | PRN

## 2019-12-24 NOTE — Telephone Encounter (Signed)
I can send in tramadol but options are somewhat limited as many stronger pain medications can worsen dementia and confusion as well as affect respiratory status, especially for someone who already has lung disease.

## 2019-12-24 NOTE — Telephone Encounter (Signed)
Daughter is calling in about getting his medication switched. Medication he is taking is not working for him. Would like to go back to old medication. Pharm on file still correct. Thanks!

## 2019-12-24 NOTE — Telephone Encounter (Signed)
PT stated new medication isn't helping with the pain. I suggested a virtual appointment this afternoon but they denied it. They asked, " Can a new medication be sent in?"   Please advise.

## 2019-12-25 NOTE — Telephone Encounter (Signed)
Pls have her reduce Risperidone to 1/2 tab daily for 3 days, then stop. Then start the Seroquel 1/2 tab daily for 3 days, then go back to his original dose. Pls send refills if needed, thanks

## 2019-12-25 NOTE — Telephone Encounter (Signed)
Spoke with pt daughter reduce Risperidone to 1/2 tab daily for 3 days, then stop. Then start the Seroquel 1/2 tab daily for 3 days, then go back to his original dose. No refills needed at this time

## 2019-12-25 NOTE — Telephone Encounter (Signed)
Patient's daughter called and left a message requesting a call back about this.

## 2019-12-25 NOTE — Addendum Note (Signed)
Addended by: Jake Seats on: 12/25/2019 12:46 PM   Modules accepted: Orders

## 2019-12-25 NOTE — Telephone Encounter (Signed)
Spoke with pt daughter she is asking how can they wing her dad off the riseidone? And can he go back on the seroquel they said that it worked better for him, he is having a hard time walking and that he is hurting all over

## 2019-12-25 NOTE — Telephone Encounter (Signed)
I called the patient daughter and she did not have any other questions.

## 2019-12-28 ENCOUNTER — Other Ambulatory Visit (HOSPITAL_BASED_OUTPATIENT_CLINIC_OR_DEPARTMENT_OTHER): Payer: Medicare Other

## 2020-01-04 MED ORDER — ROSUVASTATIN CALCIUM 5 MG PO TABS: 5.0000 mg | ORAL_TABLET | Freq: Every day | ORAL | 0 refills | Status: AC

## 2020-01-04 NOTE — Addendum Note (Signed)
Addended by: Dessie Coma on: 01/04/2020 05:11 PM   Modules accepted: Orders

## 2020-01-11 ENCOUNTER — Other Ambulatory Visit: Payer: Self-pay

## 2020-01-11 ENCOUNTER — Telehealth: Payer: Self-pay | Admitting: Family Medicine

## 2020-01-11 ENCOUNTER — Ambulatory Visit (INDEPENDENT_AMBULATORY_CARE_PROVIDER_SITE_OTHER): Payer: Medicare Other | Admitting: Family Medicine

## 2020-01-11 ENCOUNTER — Encounter: Payer: Self-pay | Admitting: Family Medicine

## 2020-01-11 VITALS — BP 113/71 | HR 106

## 2020-01-11 DIAGNOSIS — E440 Moderate protein-calorie malnutrition: Secondary | ICD-10-CM | POA: Diagnosis not present

## 2020-01-11 DIAGNOSIS — L97521 Non-pressure chronic ulcer of other part of left foot limited to breakdown of skin: Secondary | ICD-10-CM | POA: Diagnosis not present

## 2020-01-11 DIAGNOSIS — R339 Retention of urine, unspecified: Secondary | ICD-10-CM

## 2020-01-11 DIAGNOSIS — G8929 Other chronic pain: Secondary | ICD-10-CM | POA: Diagnosis not present

## 2020-01-11 LAB — POCT URINALYSIS DIP (CLINITEK)
Bilirubin, UA: NEGATIVE
Blood, UA: NEGATIVE
Glucose, UA: NEGATIVE mg/dL
Ketones, POC UA: NEGATIVE mg/dL
Leukocytes, UA: NEGATIVE
Nitrite, UA: NEGATIVE
POC PROTEIN,UA: NEGATIVE
Spec Grav, UA: >=1.03 — AB (ref 1.010–1.025)
Urobilinogen, UA: 0.2 E.U./dL
pH, UA: 5 (ref 5.0–8.0)

## 2020-01-11 MED ORDER — LIDOCAINE 5 % EX PTCH: 1.0000 | MEDICATED_PATCH | CUTANEOUS | 0 refills | Status: AC

## 2020-01-11 MED ORDER — DOXYCYCLINE HYCLATE 100 MG PO TABS: 100.0000 mg | ORAL_TABLET | Freq: Two times a day (BID) | ORAL | 0 refills | Status: AC

## 2020-01-11 NOTE — Progress Notes (Signed)
Bill Hinton - 86 y.o. male MRN [id number]  Date of birth: July 03, 1932  Subjective Chief Complaint  Patient presents with  . Follow-up    HPI Bill Hinton is a 86 y.o. male with history of dementia with behavioral disurbance, CAD, HTN, steroid dependent COPD with bronchiectasis here today for follow up.  I last saw him about 1 month ago for low back pain and decreased urination.  He also had some increased abdominal bloating at that time.  UA and labs were normal including normal PSA level.  He had been quite sedentary due to pain in his back and knees and seemed to have constipation due to this.  This has improved significantly with miralax.  He was prescribed celebrex however family stopped this after 2 weeks due to dark appearing stool.  Denies dizziness or increased fatigue.  Tramadol sent in however they never picked this up due to concern of this worsening respiratory issues.  He still complains of pain in his back with radiation into his legs.  Has some occasional abdominal pain.  His wife and daughter note that his appetite has been decreased but this has been ongoing for several weeks.  They have nutritional shakes without much improvement.  He also has some ulcerations on the L foot. Denies pain.  First noticed 1-2 weeks ago.    ROS:  A comprehensive ROS was completed and negative except as noted per HPI  Allergies  Allergen Reactions  . Fluticasone-Salmeterol Other (See Comments) and Anaphylaxis    Blisters in mouth Blisters in mouth  . Cefepime Other (See Comments)    Myoclonus  . Hydromorphone Hcl Other (See Comments)  . Theophylline Other (See Comments)    Other reaction(s): Other (See Comments) Throat irritation     Past Medical History:  Diagnosis Date  . AAA (abdominal aortic aneurysm) ([place]) 07/23/2017   US done 07/22/17: 2.7 x 3.2 cm  Recommend followup by ultrasound in 3 years  . COPD (chronic obstructive pulmonary disease) ([place])   . Reflux   . Spontaneous  pneumothorax     Past Surgical History:  Procedure Laterality Date  . CHOLECYSTECTOMY    . HERNIA REPAIR      Social History   Socioeconomic History  . Marital status: Married    Spouse name: Holley Raring  . Number of children: 6  . Years of education: 105th  . Highest education level: 7th grade  Occupational History  . Occupation: truck Geophysicist/field seismologist    Comment: retired  Tobacco Use  . Smoking status: Former Smoker    Quit date: 10/15/1998    Years since quitting: 21.2  . Smokeless tobacco: Current User    Types: Chew  Substance and Sexual Activity  . Alcohol use: No  . Drug use: No  . Sexual activity: Not Currently  Other Topics Concern  . 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 Difficulty of Paying Living Expenses:   Food Insecurity:   . Worried About Charity fundraiser in the Last Year:   . Arboriculturist in the Last Year:   Transportation Needs:   . Film/video editor (Medical):   Marland Kitchen Lack of Transportation (Non-Medical):   Physical  Activity:   . Days of Exercise per Week:   . Minutes of Exercise per Session:   Stress:   . Feeling of Stress :   Social Connections:   . Frequency of Communication with Friends and Family:   . Frequency of Social Gatherings with Friends and Family:   . Attends Religious Services:   . Active Member of Clubs or Organizations:   . Attends Archivist Meetings:   Marland Kitchen Marital Status:     Family History  Problem Relation Age of Onset  . Heart attack Mother   . Heart attack Father     Health Maintenance  Topic Date Due  . URINE MICROALBUMIN  04/08/2018  . TETANUS/TDAP  11/04/2023  . INFLUENZA VACCINE  Completed  . PNA vac Low Risk Adult  Completed      ----------------------------------------------------------------------------------------------------------------------------------------------------------------------------------------------------------------- Physical Exam There were no vitals taken for this visit.  Physical Exam Constitutional:      Appearance: Normal appearance.  HENT:     Head: Normocephalic and atraumatic.  Eyes:     General: No scleral icterus. Cardiovascular:     Rate and Rhythm: Normal rate and regular rhythm.  Pulmonary:     Effort: Pulmonary effort is normal.     Breath sounds: Normal breath sounds.  Abdominal:     General: There is no distension.     Palpations: Abdomen is soft.  Musculoskeletal:     Cervical back: Neck supple.  Skin:    Comments: Ulceration to lateral portion of L 4th digit and lateral malleolus.    Neurological:     General: No focal deficit present.     Mental Status: He is alert. Mental status is at baseline. He is disoriented.  Psychiatric:        Mood and Affect: Mood normal.        Behavior: Behavior normal.     ------------------------------------------------------------------------------------------------------------------------------------------------------------------------------------------------------------------- Assessment and Plan  Moderate protein-calorie malnutrition ([place]) Decreased appetite may be related to advancing dementia.  Continue to offer small frequent meals.    Chronic pain Chronic pain related to OA.  Had dark stools with celebrex.  Unwilling to try tramadol due to potential side effects.  Will try lidoderm patch to lumbar spine area and they can consider trying voltaren gel to knees.  Can consider gabapentin if not improving.   Foot ulceration, left, limited to breakdown of skin ([facility]) Some mild erythema around ulceration on toe, will cover with cephalexin.  Referral entered to podiatry.    Meds ordered this encounter  Medications  .  lidocaine (LIDODERM) 5 %    Sig: Place 1 patch onto the skin daily. Remove & Discard patch within 12 hours or as directed by MD    Dispense:  30 patch    Refill:  0  . doxycycline (VIBRA-TABS) 100 MG tablet    Sig: Take 1 tablet (100 mg total) by mouth 2 (two) times daily.    Dispense:  20 tablet    Refill:  0    No follow-ups on file.    This visit occurred during the SARS-CoV-2 public health emergency.  Safety protocols were in place, including screening questions prior to the visit, additional usage of staff PPE, and extensive cleaning of exam room while observing appropriate contact time as indicated for disinfecting solutions.

## 2020-01-11 NOTE — Telephone Encounter (Signed)
Patient daughter asked me on the way out if you recommend anything over the counter for helping him increase his weight. She has tried ensure and boost. She has also picked up baby food to help him eat and he does not have any appetite. Do you have any other recommendations? Please advise.

## 2020-01-11 NOTE — Patient Instructions (Signed)
Start doxycycline twice per day.  I have entered a referral to podiatry for ulcerations on foot.  I have sent over lidocaine patch.  Hopefully this will be approved to help with pain.   Continue scheduled tylenol.  Keep this 3000mg /day.

## 2020-01-13 ENCOUNTER — Encounter: Payer: Self-pay | Admitting: Family Medicine

## 2020-01-13 DIAGNOSIS — L97521 Non-pressure chronic ulcer of other part of left foot limited to breakdown of skin: Secondary | ICD-10-CM | POA: Insufficient documentation

## 2020-01-13 DIAGNOSIS — G8929 Other chronic pain: Secondary | ICD-10-CM | POA: Insufficient documentation

## 2020-01-13 NOTE — Assessment & Plan Note (Signed)
Some mild erythema around ulceration on toe, will cover with cephalexin.  Referral entered to podiatry.

## 2020-01-13 NOTE — Assessment & Plan Note (Signed)
Decreased appetite may be related to advancing dementia.  Continue to offer small frequent meals.

## 2020-01-13 NOTE — Assessment & Plan Note (Signed)
Chronic pain related to OA.  Had dark stools with celebrex.  Unwilling to try tramadol due to potential side effects.  Will try lidoderm patch to lumbar spine area and they can consider trying voltaren gel to knees.  Can consider gabapentin if not improving.

## 2020-01-13 NOTE — Telephone Encounter (Signed)
Continue to offer small portions frequently.  This may be related to his dementia as well, as we often see appetite changes as this advances.  If this continues and is if large concern please have them follow up with PCP to discuss.

## 2020-01-14 ENCOUNTER — Ambulatory Visit: Payer: Medicare Other | Admitting: Podiatry

## 2020-01-14 NOTE — Telephone Encounter (Signed)
I called the daughter and she voices understanding. No other questions.

## 2020-01-15 DIAGNOSIS — E872 Acidosis: Secondary | ICD-10-CM | POA: Diagnosis not present

## 2020-01-15 DIAGNOSIS — R6521 Severe sepsis with septic shock: Secondary | ICD-10-CM | POA: Diagnosis not present

## 2020-01-15 DIAGNOSIS — R404 Transient alteration of awareness: Secondary | ICD-10-CM | POA: Diagnosis not present

## 2020-01-15 DIAGNOSIS — R0603 Acute respiratory distress: Secondary | ICD-10-CM | POA: Diagnosis not present

## 2020-01-15 DIAGNOSIS — R000 Tachycardia, unspecified: Secondary | ICD-10-CM | POA: Diagnosis not present

## 2020-01-15 DIAGNOSIS — R4182 Altered mental status, unspecified: Secondary | ICD-10-CM | POA: Diagnosis not present

## 2020-01-15 DIAGNOSIS — K219 Gastro-esophageal reflux disease without esophagitis: Secondary | ICD-10-CM | POA: Diagnosis not present

## 2020-01-15 DIAGNOSIS — D62 Acute posthemorrhagic anemia: Secondary | ICD-10-CM | POA: Diagnosis not present

## 2020-01-15 DIAGNOSIS — K922 Gastrointestinal hemorrhage, unspecified: Secondary | ICD-10-CM | POA: Diagnosis not present

## 2020-01-15 DIAGNOSIS — E1122 Type 2 diabetes mellitus with diabetic chronic kidney disease: Secondary | ICD-10-CM | POA: Diagnosis not present

## 2020-01-15 DIAGNOSIS — J449 Chronic obstructive pulmonary disease, unspecified: Secondary | ICD-10-CM | POA: Diagnosis not present

## 2020-01-15 DIAGNOSIS — R079 Chest pain, unspecified: Secondary | ICD-10-CM | POA: Diagnosis not present

## 2020-01-15 DIAGNOSIS — R918 Other nonspecific abnormal finding of lung field: Secondary | ICD-10-CM | POA: Diagnosis not present

## 2020-01-15 DIAGNOSIS — A419 Sepsis, unspecified organism: Secondary | ICD-10-CM | POA: Diagnosis not present

## 2020-01-15 DIAGNOSIS — J9600 Acute respiratory failure, unspecified whether with hypoxia or hypercapnia: Secondary | ICD-10-CM | POA: Diagnosis not present

## 2020-01-15 DIAGNOSIS — R0789 Other chest pain: Secondary | ICD-10-CM | POA: Diagnosis not present

## 2020-01-15 DIAGNOSIS — R0681 Apnea, not elsewhere classified: Secondary | ICD-10-CM | POA: Diagnosis not present

## 2020-01-15 DIAGNOSIS — N182 Chronic kidney disease, stage 2 (mild): Secondary | ICD-10-CM | POA: Diagnosis not present

## 2020-01-15 DIAGNOSIS — R569 Unspecified convulsions: Secondary | ICD-10-CM | POA: Diagnosis not present

## 2020-01-15 DIAGNOSIS — Z87891 Personal history of nicotine dependence: Secondary | ICD-10-CM | POA: Diagnosis not present

## 2020-01-15 DIAGNOSIS — Z79899 Other long term (current) drug therapy: Secondary | ICD-10-CM | POA: Diagnosis not present

## 2020-01-15 DIAGNOSIS — Z886 Allergy status to analgesic agent status: Secondary | ICD-10-CM | POA: Diagnosis not present

## 2020-01-15 DIAGNOSIS — E785 Hyperlipidemia, unspecified: Secondary | ICD-10-CM | POA: Diagnosis not present

## 2020-01-16 MED ORDER — LORAZEPAM 2 MG/ML IJ SOLN
1.00 | INTRAMUSCULAR | Status: DC
Start: ? — End: 2020-01-16

## 2020-01-16 MED ORDER — FENTANYL CITRATE (PF) 2500 MCG/50ML IJ SOLN
25.00 | INTRAMUSCULAR | Status: DC
Start: ? — End: 2020-01-16

## 2020-01-18 ENCOUNTER — Other Ambulatory Visit: Payer: Self-pay

## 2020-01-18 MED ORDER — POTASSIUM CHLORIDE 20 MEQ PO PACK: 20.0000 meq | PACK | Freq: Every day | ORAL | 1 refills | Status: AC

## 2020-02-05 ENCOUNTER — Ambulatory Visit: Payer: Medicare Other | Admitting: Podiatry

## 2020-02-13 DEATH — deceased

## 2020-06-08 ENCOUNTER — Ambulatory Visit: Payer: Medicare Other | Admitting: Neurology
